# Patient Record
Sex: Male | Born: 1940 | Race: White | Hispanic: No | Marital: Married | Smoking: Former smoker
Health system: Southern US, Community
[De-identification: ages and names within clinical notes are randomized; demographics above are authoritative.]

## PROBLEM LIST (undated history)

## (undated) DIAGNOSIS — E785 Hyperlipidemia, unspecified: Secondary | ICD-10-CM

## (undated) DIAGNOSIS — I10 Essential (primary) hypertension: Secondary | ICD-10-CM

## (undated) DIAGNOSIS — J302 Other seasonal allergic rhinitis: Secondary | ICD-10-CM

## (undated) DIAGNOSIS — I2510 Atherosclerotic heart disease of native coronary artery without angina pectoris: Secondary | ICD-10-CM

## (undated) HISTORY — PX: RETINAL DETACHMENT SURGERY: SHX105

## (undated) HISTORY — PX: CATARACT EXTRACTION W/ INTRAOCULAR LENS IMPLANT: SHX1309

## (undated) HISTORY — PX: BACK SURGERY: SHX140

---

## 2001-08-11 ENCOUNTER — Ambulatory Visit (HOSPITAL_COMMUNITY): Admission: RE | Admit: 2001-08-11 | Discharge: 2001-08-11 | Payer: Self-pay | Admitting: General Surgery

## 2001-11-24 ENCOUNTER — Encounter: Payer: Self-pay | Admitting: Internal Medicine

## 2001-11-24 ENCOUNTER — Ambulatory Visit (HOSPITAL_COMMUNITY): Admission: RE | Admit: 2001-11-24 | Discharge: 2001-11-24 | Payer: Self-pay | Admitting: Internal Medicine

## 2002-11-10 ENCOUNTER — Encounter (HOSPITAL_COMMUNITY): Admission: RE | Admit: 2002-11-10 | Discharge: 2002-12-10 | Payer: Self-pay | Admitting: Internal Medicine

## 2002-11-13 ENCOUNTER — Encounter: Payer: Self-pay | Admitting: Internal Medicine

## 2002-12-06 ENCOUNTER — Encounter (HOSPITAL_COMMUNITY): Admission: RE | Admit: 2002-12-06 | Discharge: 2003-01-05 | Payer: Self-pay | Admitting: Internal Medicine

## 2003-12-06 ENCOUNTER — Ambulatory Visit (HOSPITAL_COMMUNITY): Admission: RE | Admit: 2003-12-06 | Discharge: 2003-12-06 | Payer: Self-pay | Admitting: Internal Medicine

## 2004-10-27 ENCOUNTER — Ambulatory Visit (HOSPITAL_COMMUNITY): Admission: RE | Admit: 2004-10-27 | Discharge: 2004-10-27 | Payer: Self-pay | Admitting: Internal Medicine

## 2005-12-11 ENCOUNTER — Encounter: Admission: RE | Admit: 2005-12-11 | Discharge: 2005-12-11 | Payer: Self-pay | Admitting: Internal Medicine

## 2006-07-13 ENCOUNTER — Ambulatory Visit (HOSPITAL_COMMUNITY): Admission: RE | Admit: 2006-07-13 | Discharge: 2006-07-13 | Payer: Self-pay | Admitting: General Surgery

## 2006-09-09 ENCOUNTER — Ambulatory Visit (HOSPITAL_COMMUNITY): Admission: RE | Admit: 2006-09-09 | Discharge: 2006-09-09 | Payer: Self-pay | Admitting: Internal Medicine

## 2006-11-01 ENCOUNTER — Encounter: Admission: RE | Admit: 2006-11-01 | Discharge: 2006-11-01 | Payer: Self-pay | Admitting: Internal Medicine

## 2006-11-08 ENCOUNTER — Ambulatory Visit: Payer: Self-pay | Admitting: Orthopedic Surgery

## 2006-11-16 ENCOUNTER — Encounter (HOSPITAL_COMMUNITY): Admission: RE | Admit: 2006-11-16 | Discharge: 2006-12-16 | Payer: Self-pay | Admitting: Orthopedic Surgery

## 2006-12-27 ENCOUNTER — Ambulatory Visit: Payer: Self-pay | Admitting: Orthopedic Surgery

## 2009-11-30 ENCOUNTER — Encounter: Admission: RE | Admit: 2009-11-30 | Discharge: 2009-11-30 | Payer: Self-pay | Admitting: Internal Medicine

## 2010-07-24 ENCOUNTER — Ambulatory Visit: Payer: Self-pay | Admitting: Otolaryngology

## 2011-02-27 NOTE — H&P (Signed)
[facility]  Patient:    Michael [name], Michael [name] Visit Number: [id number] MRN: [id number]          Service Type: END Location: DAY Attending Physician:  Dalia Heading Dictated by:   Franky Macho, M.D. Admit Date:  08/11/2001 Discharge Date: 08/11/2001   CC:         Carylon Perches, M.D.   History and Physical  DATE OF BIRTH:  Mar 11, 1941  CHIEF COMPLAINT:  History of colon polyps.  HISTORY OF PRESENT ILLNESS:  The patient is a 70 year old white male who was referred for a follow-up colonoscopy.  He had a colonoscopy with polypectomy three years ago.  Hyperplastic polyps were found at that time.  He denies any lightheadedness, weight loss, fever, abdominal pain, diarrhea, constipation, hematochezia, melena.  He denies any hemorrhoidal problems.  There is no family history of colon carcinoma.  PAST MEDICAL HISTORY: 1. High cholesterol levels. 2. Hypertension.  PAST SURGICAL HISTORY: 1. Multiple colonoscopies in the past. 2. Cyst excision from the scrotum.  CURRENT MEDICATIONS:  Pravachol, ______ , and baby aspirin.  ALLERGIES:  No known drug allergies.  REVIEW OF SYSTEMS:  The patient denies significant tobacco use.  He occasionally has two drinks a day of alcohol.  Denies any other cardiopulmonary difficulties or bleeding disorders.  PHYSICAL EXAMINATION:  GENERAL:  Well-developed, well-nourished white male in no acute distress.  VITAL SIGNS:  Afebrile, vital signs stable.  LUNGS:  Clear to auscultation with equal breath sounds bilaterally.  HEART:  Regular rate and rhythm.  Without S3, S4, or murmurs.  ABDOMEN:  Unremarkable.  RECTAL:  Deferred until the procedure.  IMPRESSION:  History of colon polyps.  PLAN:  The patient is scheduled for colonoscopy on August 11, 2001.  The risks and benefits of the procedure including bleeding and perforation were fully explained to the patient, who gave informed consent.  Colyte  preparation has been prescribed. Dictated by:   Franky Macho, M.D. Attending Physician:  Dalia Heading DD:  08/02/01 TD:  08/03/01 Job: [id number]

## 2011-02-27 NOTE — Procedures (Signed)
   NAME:  JAQUES, MINEER NO.:  [id number]   MEDICAL RECORD NO.:  [id number]                  PATIENT TYPE:   LOCATION:                                       FACILITY:  [facility]   PHYSICIAN:  Kingsley Callander. Ouida Sills, M.D.                  DATE OF BIRTH:   DATE OF PROCEDURE:  11/10/2002  DATE OF DISCHARGE:                                    STRESS TEST   CARDIOLITE STRESS TEST REPORT:   INTERPRETATION:  The patient exercised 8 minutes 37 seconds (2 minutes 37  seconds into Stage III of the Bruce Protocol) attaining a maximal heart rate  of 179 (114% of the age predicted maximal heart rate) at a workload of 10.1  METS and discontinued exercise due to fatigue.  There were no symptoms of  chest pain.  There were no arrhythmias.  There were no ST segment changes  diagnostic of ischemia.  The baseline electrocardiogram revealed normal  sinus rhythm at 93 beats per minute.  The baseline blood pressure was 142/84  and reached a peak of 182/82/88.  There was a brisk cardioaccelerator  response.   IMPRESSION:  No evidence of exercise-induced ischemia.  Cardiolite images  are pending.                                               Kingsley Callander. Ouida Sills, M.D.    ROF/MEDQ  D:  11/10/2002  T:  11/10/2002  Job:  [id number]

## 2011-02-27 NOTE — H&P (Signed)
NAME:  Michael [name], RINGER NO.:  [id number]   MEDICAL RECORD NO.:  [id number]          PATIENT TYPE:  AMB   LOCATION:  DAY                           FACILITY:  [facility]   PHYSICIAN:  Dalia Heading, M.D.  DATE OF BIRTH:  1941-01-07   DATE OF ADMISSION:  DATE OF DISCHARGE:  LH                                HISTORY & PHYSICAL   CHIEF COMPLAINT:  History of colon polyps.   HISTORY OF PRESENT ILLNESS:  The patient is a 70 year old white male status  post a colonoscopy in 2002 with polypectomy who now presents for a follow-up  colonoscopy.  The pathology in 2002 was negative for any malignancy.  This  polyp was found in the distal transverse colon.  He denies any current  gastrointestinal complaints.  No new medical problems have been noted.   PAST MEDICAL HISTORY:  Includes high cholesterol levels, hypertension.   PAST SURGICAL HISTORY:  Multiple colonoscopies in the past, excision of cyst  from the scrotum, diverticula.   CURRENT MEDICATIONS:  Crestor, Benicar.   ALLERGIES:  NO KNOWN DRUG ALLERGIES.   REVIEW OF SYSTEMS:  The patient denies tobacco use.  Drinks alcohol  socially.   PHYSICAL EXAMINATION:  GENERAL:  The patient is a well-developed, well-  nourished white male in no acute distress.  LUNGS:  Clear to auscultation with equal breath sounds bilaterally.  HEART:  Reveals regular rate and rhythm without S3, S4, or murmurs.  ABDOMEN:  Soft, nontender, nondistended.  RECTAL:  Deferred to the procedure.   IMPRESSION:  History of colon polyps, diverticulosis.   PLAN:  The patient is scheduled for a colonoscopy on 07/13/2006.  The risks  and benefits of the procedure, including bleeding and perforation, were  fully explained to the patient, gave informed consent.      Dalia Heading, M.D.  Electronically Signed     MAJ/MEDQ  D:  07/08/2006  T:  07/08/2006  Job:  [id number]   cc:   Kingsley Callander. Ouida Sills, MD  Fax: [phone]

## 2011-07-14 NOTE — H&P (Signed)
NTS SOAP Note  Vital Signs:  Vitals as of: 07/14/2011: Systolic 153: Diastolic 92: Heart Rate 79: Temp 97.79F: Height 75ft 10in: Weight 161Lbs 0 Ounces: Pain Level 0: BMI 23  BMI : 23.1 kg/m2  Subjective: This 70 Years 2 Months old Male presents for of need for screening TCS. Has h/o colon polyps in the remote past, last, TCS in 2007 negative. No GI complaints. No family h/o colon carcinoma.  Review of Symptoms:  Constitutional: unremarkable  Head: unremarkable  Eyes:unremarkable  Nose/Mouth/Throat:unremarkable  Cardiovascular:unremarkable  Respiratory: wheezing Gastrointestinal:unremarkable  Genitourinary: unremarkable  Musculoskeletal: unremarkable  Skin: unremarkable  Hematolgic/Lymphatic: unremarkable  hayfever   Past Medical History:Reviewed  Past Medical History  Surgical History: TCS in 2007 for follow up of colon polyps Medical Problems: High Blood pressure, High cholesterol, asthma Allergies: nkda Medications: crestor, diovan, advair, allegra   Social History: Reviewed   Social History  Preferred Language: English ([place]) Race: White Ethnicity: Not Hispanic / Latino Age: 70 Years 2 Months Marital Status: M Alcohol: No Recreational drug(s): No   Smoking Status: Never smoker reviewed on 07/14/2011  Family History: Reviewed  Family History  Is there a family history of:No family h/o colon carcinoma    Objective Information:  General: Well appearing, well nourished in no distress.  Skin:no rash or prominent lesions  Head: Atraumatic; no masses; no abnormalities  Heart: RRR, no murmur or gallop. Normal S1, S2. No S3, S4.  Lungs:CTA bilaterally, no wheezes, rhonchi, rales. Breathing unlabored.  Abdomen: Soft, NT/ND, no HSM, no masses.  deferred to procedure  Assessment: Need for screening TCS  Diagnosis & Procedure: DiagnosisCode: V76.51, ProcedureCode: 91478,    Plan:Scheduled for TCS on 07/31/11.   Patient Education: Alternative  treatments to surgery were discussed with patient (and family).Risks and benefits of procedure were fully explained to the patient (and family) who gave informed consent. Patient/family questions were addressed.  Follow-up: Pending Surgery

## 2011-07-30 MED ORDER — SODIUM CHLORIDE 0.45 % IV SOLN
Freq: Once | INTRAVENOUS | Status: AC
  Administered 2011-07-31: 1000 mL via INTRAVENOUS

## 2011-07-31 ENCOUNTER — Ambulatory Visit (HOSPITAL_COMMUNITY)
Admission: RE | Admit: 2011-07-31 | Discharge: 2011-07-31 | Disposition: A | Discharge status: Home / Self Care | Payer: Medicare Other | Source: Ambulatory Visit | Attending: General Surgery | Admitting: General Surgery

## 2011-07-31 ENCOUNTER — Encounter (HOSPITAL_COMMUNITY)
Admission: RE | Disposition: A | Discharge status: Home / Self Care | Payer: Self-pay | Source: Ambulatory Visit | Attending: General Surgery

## 2011-07-31 ENCOUNTER — Encounter (HOSPITAL_COMMUNITY): Payer: Self-pay | Admitting: *Deleted

## 2011-07-31 DIAGNOSIS — K5730 Diverticulosis of large intestine without perforation or abscess without bleeding: Secondary | ICD-10-CM | POA: Insufficient documentation

## 2011-07-31 DIAGNOSIS — Z1211 Encounter for screening for malignant neoplasm of colon: Secondary | ICD-10-CM | POA: Insufficient documentation

## 2011-07-31 DIAGNOSIS — Z79899 Other long term (current) drug therapy: Secondary | ICD-10-CM | POA: Insufficient documentation

## 2011-07-31 DIAGNOSIS — I10 Essential (primary) hypertension: Secondary | ICD-10-CM | POA: Insufficient documentation

## 2011-07-31 DIAGNOSIS — E7800 Pure hypercholesterolemia, unspecified: Secondary | ICD-10-CM | POA: Insufficient documentation

## 2011-07-31 HISTORY — DX: Essential (primary) hypertension: I10

## 2011-07-31 HISTORY — PX: COLONOSCOPY: SHX5424

## 2011-07-31 HISTORY — DX: Atherosclerotic heart disease of native coronary artery without angina pectoris: I25.10

## 2011-07-31 SURGERY — COLONOSCOPY
Anesthesia: Moderate Sedation

## 2011-07-31 MED ORDER — MEPERIDINE HCL 100 MG/ML IJ SOLN
INTRAMUSCULAR | Status: AC
  Filled 2011-07-31: qty 1

## 2011-07-31 MED ORDER — MIDAZOLAM HCL 5 MG/5ML IJ SOLN
INTRAMUSCULAR | Status: AC
  Filled 2011-07-31: qty 10

## 2011-07-31 MED ORDER — MEPERIDINE HCL 25 MG/ML IJ SOLN
INTRAMUSCULAR | Status: DC | PRN
  Administered 2011-07-31: 50 mg via INTRAVENOUS

## 2011-07-31 MED ORDER — MIDAZOLAM HCL 5 MG/5ML IJ SOLN
INTRAMUSCULAR | Status: DC | PRN
  Administered 2011-07-31: 2 mg via INTRAVENOUS
  Administered 2011-07-31: 1 mg via INTRAVENOUS

## 2011-07-31 MED ORDER — STERILE WATER FOR IRRIGATION IR SOLN
Status: DC | PRN
  Administered 2011-07-31: 08:00:00

## 2011-07-31 NOTE — Interval H&P Note (Signed)
History and Physical Interval Note:   07/31/2011   7:29 AM   Michael [name]  has presented today for surgery, with the diagnosis of Screening for colon cancer [V76.51]  The various methods of treatment have been discussed with the patient and family. After consideration of risks, benefits and other options for treatment, the patient has consented to  Procedure(s): COLONOSCOPY as a surgical intervention .  I have reviewed the patients' chart and labs.  Questions were answered to the patient's satisfaction.     Dalia Heading  MD

## 2011-08-07 ENCOUNTER — Encounter (HOSPITAL_COMMUNITY): Payer: Self-pay | Admitting: General Surgery

## 2011-10-21 ENCOUNTER — Encounter: Payer: Self-pay | Admitting: Orthopedic Surgery

## 2011-10-21 ENCOUNTER — Ambulatory Visit (INDEPENDENT_AMBULATORY_CARE_PROVIDER_SITE_OTHER): Payer: Medicare Other | Admitting: Orthopedic Surgery

## 2011-10-21 VITALS — BP 130/80 | Ht 70.0 in | Wt 153.0 lb

## 2011-10-21 DIAGNOSIS — M719 Bursopathy, unspecified: Secondary | ICD-10-CM

## 2011-10-21 DIAGNOSIS — M7580 Other shoulder lesions, unspecified shoulder: Secondary | ICD-10-CM

## 2011-10-21 NOTE — Patient Instructions (Addendum)
You have received a steroid shot. 15% of patients experience increased pain at the injection site with in the next 24 hours. This is best treated with ice and tylenol extra strength 2 tabs every 8 hours. If you are still having pain please call the office.   Start physical therapy at the hospital  Impingement Syndrome, Rotator Cuff, Bursitis with Rehab Impingement syndrome is a condition that involves inflammation of the tendons of the rotator cuff and the subacromial bursa, that causes pain in the shoulder. The rotator cuff consists of four tendons and muscles that control much of the shoulder and upper arm function. The subacromial bursa is a fluid filled sac that helps reduce friction between the rotator cuff and one of the bones of the shoulder (acromion). Impingement syndrome is usually an overuse injury that causes swelling of the bursa (bursitis), swelling of the tendon (tendonitis), and/or a tear of the tendon (strain). Strains are classified into three categories. Grade 1 strains cause pain, but the tendon is not lengthened. Grade 2 strains include a lengthened ligament, due to the ligament being stretched or partially ruptured. With grade 2 strains there is still function, although the function may be decreased. Grade 3 strains include a complete tear of the tendon or muscle, and function is usually impaired. SYMPTOMS    Pain around the shoulder, often at the outer portion of the upper arm.     Pain that gets worse with shoulder function, especially when reaching overhead or lifting.     Sometimes, aching when not using the arm.     Pain that wakes you up at night.     Sometimes, tenderness, swelling, warmth, or redness over the affected area.     Loss of strength.     Limited motion of the shoulder, especially reaching behind the back (to the back pocket or to unhook bra) or across your body.     Crackling sound (crepitation) when moving the arm.     Biceps tendon pain and  inflammation (in the front of the shoulder). Worse when bending the elbow or lifting.  CAUSES   Impingement syndrome is often an overuse injury, in which chronic (repetitive) motions cause the tendons or bursa to become inflamed. A strain occurs when a force is paced on the tendon or muscle that is greater than it can withstand. Common mechanisms of injury include: Stress from sudden increase in duration, frequency, or intensity of training.  Direct hit (trauma) to the shoulder.     Aging, erosion of the tendon with normal use.     Bony bump on shoulder (acromial spur).  RISK INCREASES WITH:  Contact sports (football, wrestling, boxing).     Throwing sports (baseball, tennis, volleyball).     Weightlifting and bodybuilding.     Heavy labor.     Previous injury to the rotator cuff, including impingement.     Poor shoulder strength and flexibility.     Failure to warm up properly before activity.     Inadequate protective equipment.     Old age.     Bony bump on shoulder (acromial spur).  PREVENTION    Warm up and stretch properly before activity.     Allow for adequate recovery between workouts.     Maintain physical fitness:     Strength, flexibility, and endurance.     Cardiovascular fitness.     Learn and use proper exercise technique.  PROGNOSIS   If treated properly, impingement syndrome usually goes away  within 6 weeks. Sometimes surgery is required.   RELATED COMPLICATIONS    Longer healing time if not properly treated, or if not given enough time to heal.     Recurring symptoms, that result in a chronic condition.     Shoulder stiffness, frozen shoulder, or loss of motion.     Rotator cuff tendon tear.     Recurring symptoms, especially if activity is resumed too soon, with overuse, with a direct blow, or when using poor technique.  TREATMENT   Treatment first involves the use of ice and medicine, to reduce pain and inflammation. The use of  strengthening and stretching exercises may help reduce pain with activity. These exercises may be performed at home or with a therapist. If non-surgical treatment is unsuccessful after more than 6 months, surgery may be advised. After surgery and rehabilitation, activity is usually possible in 3 months.   MEDICATION  If pain medicine is needed, nonsteroidal anti-inflammatory medicines (aspirin and ibuprofen), or other minor pain relievers (acetaminophen), are often advised.     Do not take pain medicine for 7 days before surgery.     Prescription pain relievers may be given, if your caregiver thinks they are needed. Use only as directed and only as much as you need.     Corticosteroid injections may be given by your caregiver. These injections should be reserved for the most serious cases, because they may only be given a certain number of times.  HEAT AND COLD  Cold treatment (icing) should be applied for 10 to 15 minutes every 2 to 3 hours for inflammation and pain, and immediately after activity that aggravates your symptoms. Use ice packs or an ice massage.     Heat treatment may be used before performing stretching and strengthening activities prescribed by your caregiver, physical therapist, or athletic trainer. Use a heat pack or a warm water soak.  SEEK MEDICAL CARE IF:    Symptoms get worse or do not improve in 4 to 6 weeks, despite treatment.     New, unexplained symptoms develop. (Drugs used in treatment may produce side effects.)  EXERCISES   RANGE OF MOTION (ROM) AND STRETCHING EXERCISES - Impingement Syndrome (Rotator Cuff  Tendinitis, Bursitis) These exercises may help you when beginning to rehabilitate your injury. Your symptoms may go away with or without further involvement from your physician, physical therapist or athletic trainer. While completing these exercises, remember:    Restoring tissue flexibility helps normal motion to return to the joints. This allows  healthier, less painful movement and activity.     An effective stretch should be held for at least 30 seconds.     A stretch should never be painful. You should only feel a gentle lengthening or release in the stretched tissue.  STRETCH - Flexion, Standing  Stand with good posture. With an underhand grip on your right / left hand, and an overhand grip on the opposite hand, grasp a broomstick or cane so that your hands are a little more than shoulder width apart.     Keeping your right / left elbow straight and shoulder muscles relaxed, push the stick with your opposite hand, to raise your right / left arm in front of your body and then overhead. Raise your arm until you feel a stretch in your right / left shoulder, but before you have increased shoulder pain.     Try to avoid shrugging your right / left shoulder as your arm rises, by keeping your  shoulder blade tucked down and toward your mid-back spine. Hold for __________ seconds.     Slowly return to the starting position.  Repeat __________ times. Complete this exercise __________ times per day. STRETCH - Abduction, Supine  Lie on your back. With an underhand grip on your right / left hand and an overhand grip on the opposite hand, grasp a broomstick or cane so that your hands are a little more than shoulder width apart.     Keeping your right / left elbow straight and your shoulder muscles relaxed, push the stick with your opposite hand, to raise your right / left arm out to the side of your body and then overhead. Raise your arm until you feel a stretch in your right / left shoulder, but before you have increased shoulder pain.     Try to avoid shrugging your right / left shoulder as your arm rises, by keeping your shoulder blade tucked down and toward your mid-back spine. Hold for __________ seconds.     Slowly return to the starting position.  Repeat __________ times. Complete this exercise __________ times per day. ROM - Flexion,  Active-Assisted  Lie on your back. You may bend your knees for comfort.     Grasp a broomstick or cane so your hands are about shoulder width apart. Your right / left hand should grip the end of the stick, so that your hand is positioned "thumbs-up," as if you were about to shake hands.     Using your healthy arm to lead, raise your right / left arm overhead, until you feel a gentle stretch in your shoulder. Hold for __________ seconds.     Use the stick to assist in returning your right / left arm to its starting position.  Repeat __________ times. Complete this exercise __________ times per day.   ROM - Internal Rotation, Supine   Lie on your back on a firm surface. Place your right / left elbow about 60 degrees away from your side. Elevate your elbow with a folded towel, so that the elbow and shoulder are the same height.     Using a broomstick or cane and your strong arm, pull your right / left hand toward your body until you feel a gentle stretch, but no increase in your shoulder pain. Keep your shoulder and elbow in place throughout the exercise.     Hold for __________ seconds. Slowly return to the starting position.  Repeat __________ times. Complete this exercise __________ times per day. STRETCH - Internal Rotation  Place your right / left hand behind your back, palm up.     Throw a towel or belt over your opposite shoulder. Grasp the towel with your right / left hand.     While keeping an upright posture, gently pull up on the towel, until you feel a stretch in the front of your right / left shoulder.     Avoid shrugging your right / left shoulder as your arm rises, by keeping your shoulder blade tucked down and toward your mid-back spine.     Hold for __________ seconds. Release the stretch, by lowering your healthy hand.  Repeat __________ times. Complete this exercise __________ times per day. ROM - Internal Rotation   Using an underhand grip, grasp a stick behind your  back with both hands.     While standing upright with good posture, slide the stick up your back until you feel a mild stretch in the front of your  shoulder.     Hold for __________ seconds. Slowly return to your starting position.  Repeat __________ times. Complete this exercise __________ times per day.   STRETCH - Posterior Shoulder Capsule   Stand or sit with good posture. Grasp your right / left elbow and draw it across your chest, keeping it at the same height as your shoulder.     Pull your elbow, so your upper arm comes in closer to your chest. Pull until you feel a gentle stretch in the back of your shoulder.     Hold for __________ seconds.  Repeat __________ times. Complete this exercise __________ times per day. STRENGTHENING EXERCISES - Impingement Syndrome (Rotator Cuff Tendinitis, Bursitis) These exercises may help you when beginning to rehabilitate your injury. They may resolve your symptoms with or without further involvement from your physician, physical therapist or athletic trainer. While completing these exercises, remember:  Muscles can gain both the endurance and the strength needed for everyday activities through controlled exercises.     Complete these exercises as instructed by your physician, physical therapist or athletic trainer. Increase the resistance and repetitions only as guided.     You may experience muscle soreness or fatigue, but the pain or discomfort you are trying to eliminate should never worsen during these exercises. If this pain does get worse, stop and make sure you are following the directions exactly. If the pain is still present after adjustments, discontinue the exercise until you can discuss the trouble with your clinician.     During your recovery, avoid activity or exercises which involve actions that place your injured hand or elbow above your head or behind your back or head. These positions stress the tissues which you are trying to heal.   STRENGTH - Scapular Depression and Adduction   With good posture, sit on a firm chair. Support your arms in front of you, with pillows, arm rests, or on a table top. Have your elbows in line with the sides of your body.     Gently draw your shoulder blades down and toward your mid-back spine. Gradually increase the tension, without tensing the muscles along the top of your shoulders and the back of your neck.     Hold for __________ seconds. Slowly release the tension and relax your muscles completely before starting the next repetition.     After you have practiced this exercise, remove the arm support and complete the exercise in standing as well as sitting position.  Repeat __________ times. Complete this exercise __________ times per day.   STRENGTH - Shoulder Abductors, Isometric  With good posture, stand or sit about 4-6 inches from a wall, with your right / left side facing the wall.     Bend your right / left elbow. Gently press your right / left elbow into the wall. Increase the pressure gradually, until you are pressing as hard as you can, without shrugging your shoulder or increasing any shoulder discomfort.     Hold for __________ seconds.     Release the tension slowly. Relax your shoulder muscles completely before you begin the next repetition.  Repeat __________ times. Complete this exercise __________ times per day.   STRENGTH - External Rotators, Isometric  Keep your right / left elbow at your side and bend it 90 degrees.     Step into a door frame so that the outside of your right / left wrist can press against the door frame without your upper arm leaving your  side.     Gently press your right / left wrist into the door frame, as if you were trying to swing the back of your hand away from your stomach. Gradually increase the tension, until you are pressing as hard as you can, without shrugging your shoulder or increasing any shoulder discomfort.     Hold for  __________ seconds.     Release the tension slowly. Relax your shoulder muscles completely before you begin the next repetition.  Repeat __________ times. Complete this exercise __________ times per day.   STRENGTH - Supraspinatus   Stand or sit with good posture. Grasp a __________ weight, or an exercise band or tubing, so that your hand is "thumbs-up," like you are shaking hands.     Slowly lift your right / left arm in a "V" away from your thigh, diagonally into the space between your side and straight ahead. Lift your hand to shoulder height or as far as you can, without increasing any shoulder pain. At first, many people do not lift their hands above shoulder height.     Avoid shrugging your right / left shoulder as your arm rises, by keeping your shoulder blade tucked down and toward your mid-back spine.     Hold for __________ seconds. Control the descent of your hand, as you slowly return to your starting position.  Repeat __________ times. Complete this exercise __________ times per day.   STRENGTH - External Rotators  Secure a rubber exercise band or tubing to a fixed object (table, pole) so that it is at the same height as your right / left elbow when you are standing or sitting on a firm surface.     Stand or sit so that the secured exercise band is at your uninjured side.     Bend your right / left elbow 90 degrees. Place a folded towel or small pillow under your right / left arm, so that your elbow is a few inches away from your side.     Keeping the tension on the exercise band, pull it away from your body, as if pivoting on your elbow. Be sure to keep your body steady, so that the movement is coming only from your rotating shoulder.     Hold for __________ seconds. Release the tension in a controlled manner, as you return to the starting position.  Repeat __________ times. Complete this exercise __________ times per day.   STRENGTH - Internal Rotators   Secure a rubber  exercise band or tubing to a fixed object (table, pole) so that it is at the same height as your right / left elbow when you are standing or sitting on a firm surface.     Stand or sit so that the secured exercise band is at your right / left side.     Bend your elbow 90 degrees. Place a folded towel or small pillow under your right / left arm so that your elbow is a few inches away from your side.     Keeping the tension on the exercise band, pull it across your body, toward your stomach. Be sure to keep your body steady, so that the movement is coming only from your rotating shoulder.     Hold for __________ seconds. Release the tension in a controlled manner, as you return to the starting position.  Repeat __________ times. Complete this exercise __________ times per day.   STRENGTH - Scapular Protractors, Standing   Stand arms length away  from a wall. Place your hands on the wall, keeping your elbows straight.     Begin by dropping your shoulder blades down and toward your mid-back spine.     To strengthen your protractors, keep your shoulder blades down, but slide them forward on your rib cage. It will feel as if you are lifting the back of your rib cage away from the wall. This is a subtle motion and can be challenging to complete. Ask your caregiver for further instruction, if you are not sure you are doing the exercise correctly.     Hold for __________ seconds. Slowly return to the starting position, resting the muscles completely before starting the next repetition.  Repeat __________ times. Complete this exercise __________ times per day. STRENGTH - Scapular Protractors, Supine  Lie on your back on a firm surface. Extend your right / left arm straight into the air while holding a __________ weight in your hand.     Keeping your head and back in place, lift your shoulder off the floor.     Hold for __________ seconds. Slowly return to the starting position, and allow your muscles  to relax completely before starting the next repetition.  Repeat __________ times. Complete this exercise __________ times per day. STRENGTH - Scapular Protractors, Quadruped  Get onto your hands and knees, with your shoulders directly over your hands (or as close as you can be, comfortably).     Keeping your elbows locked, lift the back of your rib cage up into your shoulder blades, so your mid-back rounds out. Keep your neck muscles relaxed.     Hold this position for __________ seconds. Slowly return to the starting position and allow your muscles to relax completely before starting the next repetition.  Repeat __________ times. Complete this exercise __________ times per day.   STRENGTH - Scapular Retractors  Secure a rubber exercise band or tubing to a fixed object (table, pole), so that it is at the height of your shoulders when you are either standing, or sitting on a firm armless chair.     With a palm down grip, grasp an end of the band in each hand. Straighten your elbows and lift your hands straight in front of you, at shoulder height. Step back, away from the secured end of the band, until it becomes tense.     Squeezing your shoulder blades together, draw your elbows back toward your sides, as you bend them. Keep your upper arms lifted away from your body throughout the exercise.     Hold for __________ seconds. Slowly ease the tension on the band, as you reverse the directions and return to the starting position.  Repeat __________ times. Complete this exercise __________ times per day. STRENGTH - Shoulder Extensors   Secure a rubber exercise band or tubing to a fixed object (table, pole) so that it is at the height of your shoulders when you are either standing, or sitting on a firm armless chair.     With a thumbs-up grip, grasp an end of the band in each hand. Straighten your elbows and lift your hands straight in front of you, at shoulder height. Step back, away from the  secured end of the band, until it becomes tense.     Squeezing your shoulder blades together, pull your hands down to the sides of your thighs. Do not allow your hands to go behind you.     Hold for __________ seconds. Slowly ease the tension on  the band, as you reverse the directions and return to the starting position.  Repeat __________ times. Complete this exercise __________ times per day.   STRENGTH - Scapular Retractors and External Rotators   Secure a rubber exercise band or tubing to a fixed object (table, pole) so that it is at the height as your shoulders, when you are either standing, or sitting on a firm armless chair.     With a palm down grip, grasp an end of the band in each hand. Bend your elbows 90 degrees and lift your elbows to shoulder height, at your sides. Step back, away from the secured end of the band, until it becomes tense.     Squeezing your shoulder blades together, rotate your shoulders so that your upper arms and elbows remain stationary, but your fists travel upward to head height.     Hold for __________ seconds. Slowly ease the tension on the band, as you reverse the directions and return to the starting position.  Repeat __________ times. Complete this exercise __________ times per day.   STRENGTH - Scapular Retractors and External Rotators, Rowing   Secure a rubber exercise band or tubing to a fixed object (table, pole) so that it is at the height of your shoulders, when you are either standing, or sitting on a firm armless chair.     With a palm down grip, grasp an end of the band in each hand. Straighten your elbows and lift your hands straight in front of you, at shoulder height. Step back, away from the secured end of the band, until it becomes tense.     Step 1: Squeeze your shoulder blades together. Bending your elbows, draw your hands to your chest, as if you are rowing a boat. At the end of this motion, your hands and elbow should be at shoulder  height and your elbows should be out to your sides.     Step 2: Rotate your shoulders, to raise your hands above your head. Your forearms should be vertical and your upper arms should be horizontal.     Hold for __________ seconds. Slowly ease the tension on the band, as you reverse the directions and return to the starting position.  Repeat __________ times. Complete this exercise __________ times per day.   STRENGTH - Scapular Depressors  Find a sturdy chair without wheels, such as a dining room chair.     Keeping your feet on the floor, and your hands on the chair arms, lift your bottom up from the seat, and lock your elbows.     Keeping your elbows straight, allow gravity to pull your body weight down. Your shoulders will rise toward your ears.     Raise your body against gravity by drawing your shoulder blades down your back, shortening the distance between your shoulders and ears. Although your feet should always maintain contact with the floor, your feet should progressively support less body weight, as you get stronger.     Hold for __________ seconds. In a controlled and slow manner, lower your body weight to begin the next repetition.  Repeat __________ times. Complete this exercise __________ times per day.   Document Released: 09/28/2005 Document Revised: 06/10/2011 Document Reviewed: 01/10/2009 [facility] Patient Information 2012 [place], [place].

## 2011-10-21 NOTE — Progress Notes (Signed)
  Subjective:    Michael [name] is a 71 y.o. male who presents with right shoulder pain. The symptoms began several months ago. Aggravating factors: repetitive activity, overhead activity and routine activities. Pain is located diffusely throughout the shoulder and RIGHT shoulder joint and RIGHT arm above the elbow. Discomfort is described as aching, burning and sharp/stabbing. Symptoms are exacerbated by repetitive movements, overhead movements and normal daily activities. Evaluation to date: none. Therapy to date includes: nothing specific.  The following portions of the patient's history were reviewed and updated as appropriate: allergies, current medications, past family history, past medical history, past social history, past surgical history and problem list.  Review of Systems A comprehensive review of systems was negative except for: Respiratory: positive for dyspnea on exertion and wheezing   Objective:    BP 130/80  Ht 5\' 10"  (1.778 m)  Wt 153 lb (69.4 kg)  BMI 21.95 kg/m2  Physical Exam(12) GENERAL: normal development   CDV: pulses are normal   Skin: normal  Lymph: nodes were not palpable/normal  Psychiatric: awake, alert and oriented  Neuro: normal sensation  Right shoulder: there are no areas of specific palpable tenderness.  Range of motion remains full area strength remains full in internal and external rotation with mild weakness in forward elevation in the scapular plane.  The shoulder remained stable.  Her Neer impingement sign was negative.  There was crepitance on range of motion in the subacromial space.  The Hawkins maneuver was normal.  Left shoulder: normal active ROM, no tenderness, no impingement sign     Assessment:    Right rotator cuff tendinitis    Plan:    Natural history and expected course discussed. Questions answered. Agricultural engineer distributed. Plain film x-rays. Shoulder injection. See procedure note. Physical therapy referral.    Shoulder Injection Procedure Note  Pre-operative Diagnosis: right shoulder tendinitis and rotator cuff syndrome  Post-operative Diagnosis: same  Indications: rotator cuff syndrome  Anesthesia:  ethyl chloride  Procedure Details   Shoulder Injection Procedure Note   Pre-operative Diagnosis: right  RC Syndrome  Post-operative Diagnosis: same  Indications: pain   Anesthesia: ethyl chloride   Procedure Details   Verbal consent was obtained for the procedure. The shoulder was prepped withalcohol and the skin was anesthetized. A 20 gauge needle was advanced into the subacromial space through posterior approach without difficulty  The space was then injected with 3 ml 1% lidocaine and 1 ml of depomedrol. The injection site was cleansed with isopropyl alcohol and a dressing was applied.  Complications:  None; patient tolerated the procedure well.  Separately identifiable x-ray report RIGHT shoulder done for RIGHT shoulder pain x6 months  AP and lateral RIGHT shoulder  The glenohumeral joint is normal.  There is a type III acromion.  There are no other abnormalities.  Impression normal RIGHT shoulder.

## 2011-10-26 ENCOUNTER — Ambulatory Visit (HOSPITAL_COMMUNITY)
Admission: RE | Admit: 2011-10-26 | Discharge: 2011-10-26 | Disposition: A | Discharge status: Home / Self Care | Payer: Medicare Other | Source: Ambulatory Visit | Attending: Orthopedic Surgery | Admitting: Orthopedic Surgery

## 2011-10-26 DIAGNOSIS — R42 Dizziness and giddiness: Secondary | ICD-10-CM | POA: Insufficient documentation

## 2011-10-26 DIAGNOSIS — I10 Essential (primary) hypertension: Secondary | ICD-10-CM | POA: Insufficient documentation

## 2011-10-26 DIAGNOSIS — M25519 Pain in unspecified shoulder: Secondary | ICD-10-CM | POA: Insufficient documentation

## 2011-10-26 DIAGNOSIS — R29898 Other symptoms and signs involving the musculoskeletal system: Secondary | ICD-10-CM | POA: Insufficient documentation

## 2011-10-26 DIAGNOSIS — M778 Other enthesopathies, not elsewhere classified: Secondary | ICD-10-CM | POA: Insufficient documentation

## 2011-10-26 DIAGNOSIS — M6281 Muscle weakness (generalized): Secondary | ICD-10-CM | POA: Insufficient documentation

## 2011-10-26 DIAGNOSIS — IMO0001 Reserved for inherently not codable concepts without codable children: Secondary | ICD-10-CM | POA: Insufficient documentation

## 2011-10-26 DIAGNOSIS — M25619 Stiffness of unspecified shoulder, not elsewhere classified: Secondary | ICD-10-CM | POA: Insufficient documentation

## 2011-10-26 NOTE — Patient Instructions (Addendum)
Patient instructed on how to properly poistion shoulder at night to relieve pain. Given AROM shoulder elevation, retraction protraction and rotation and asked to cut down on RUE weights at gym for now. Given explanation of role of OT and course of therapy and is agreeable to OT 3 x week for 6 weeks to decrease pain and increase strength.

## 2011-10-26 NOTE — Progress Notes (Signed)
Occupational Therapy Evaluation  Patient Details  Name: Michael [name] MRN: [id number] Date of Birth: January 05, 1941  Today's Date: 10/26/2011 Time: 4098-1191 Occupational Therapy Evaluation 850-825 35' Therapeutic Exercises [phone] 10' Time Calculation (min): 45 min  Visit#: 1  of 18   Re-eval: 11/26/11  Assessment Diagnosis:  (Right Rotator Cuff Tendonitis) Surgical Date:  (N/A) Next MD Visit:  (12/19/11) Prior Therapy:  (Dr. Romeo Apple Provided Exeercises)  Past Medical History:  Past Medical History  Diagnosis Date  . Hypertension   . Coronary artery disease    Past Surgical History:  Past Surgical History  Procedure Date  . Colonoscopy 07/31/2011    Procedure: COLONOSCOPY;  Surgeon: Dalia Heading;  Location: [facility];  Service: Gastroenterology;  Laterality: N/A;    Subjective Symptoms/Limitations Symptoms: S: "No pain with rest but once he uses the right shoulder to reach into aviary pain begins or when sleeping at night" Limitations:  Michael [name] is a 1 yr. old retired gentleman who has been experiancing 8/10 right shoulder pain over the past several months. Pain is now bothering him when he attempts to sleep  or work with his bird aviaries. Pain Assessment Currently in Pain?: Yes Pain Score:   8 Pain Location: Shoulder Pain Orientation: Right Pain Radiating Towards: down the lasteral side of humerus to elbow Pain Onset: More than a month ago Pain Frequency: Intermittent Multiple Pain Sites: No  Precautions/Restrictions : Vertigo with movement on the mat.    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)  Assessment ADL/Vision/Perception Vision - History Baseline Vision: Wears glasses only for reading Vision - Assessment Eye Alignment: Within Chemical engineer Perception: Within Functional Limits Praxis Praxis: Intact  Cognition/Observation Cognition Arousal/Alertness: Awake/alert Orientation Level: Oriented X4 Safety/Judgment: Appears intact  Sensation/Coordination/Edema Sensation Light Touch: Appears Intact Stereognosis: Appears Intact Hot/Cold: Appears Intact Proprioception: Appears Intact Coordination Gross Motor Movements are Fluid and Coordinated: Yes Fine Motor Movements are Fluid and Coordinated: Yes Edema Edema:  (None)  Additional Assessments RUE Assessment RUE Assessment: Exceptions to [facility] RUE AROM (degrees) RUE Overall AROM Comments:  (Patient demonstrated AROM for RUE WNL) RUE PROM (degrees) RUE Overall PROM Comments:  (Patient has PROM of entire RUE WNL) RUE Strength RUE Overall Strength Comments:  (4/5 strength in IR, ABD, F. FLex, Horizontal Abd. 5/5 ER ) LUE Assessment LUE Assessment: Within Functional Limits  Exercise/Treatments   Seated Elevation: AROM;15 reps Extension: AROM;15 reps Retraction: AROM;15 reps Row: AROM;15 reps Protraction: PROM;15 reps Occupational Therapy Assessment and Plan OT Assessment and Plan Clinical Impression Statement: A: Patient has Full AROM /PROM RUE shoulder with decreased 4/5 strength with IR, ABD, F. Flex and Horizontal Abd. He also experiences Vertigo with change of positions on the mat requiring time to rest and slow transitions. RUE shoulder pain with deceleration of the shoulder and with  IR, ABD, F Flex .  Rehab Potential: Excellent Clinical Impairments Affecting Rehab Potential: Vertigo OT Frequency: Min 3X/week OT Duration: 6 weeks OT Treatment/Interventions: Self-care/ADL training;Therapeutic  exercise;Manual therapy;Modalities;Therapeutic activities;Patient/family education OT Plan: P: Skilled OT required 3 x week x 6 weeks to decrease pain increase strength in RUE shoulder and train in HEP.  Goals Home Exercise Program Pt will Perform Home Exercise Program: Independently Short Term Goals Time to Complete Short Term Goals: 2 weeks Short Term Goal 1: Independent with HEP for Cane exercises. Short Term Goal 2: Independent correct positioning of arm for 0/10 pain with sleep. Short Term Goal 3: Decrease Right shoulder pain to 3/10 from 8/10 with use. Short Term Goal 4: Patient state ability to reach RUE arm into Aviary at shest level without pain. Short Term Goal 5: Patient able to begin cane exercises without pain. Long Term Goals Time to Complete Long Term Goals: Other (comment) (6 weeks) Long Term Goal 1: Regain right shoulder strength to 5/5 Long Term Goal 2: Restored ability to lift arm into aviary and perform functions of caring for his birds without pain Long Term Goal 3: Return to full ADL's without pain when reaching over head  Long Term Goal 4: Independent in HEP for continued strengthening. Long Term Goal 5: Pateint able to sleep through the night without pain in RUE shoulder  Problem List Patient Active Problem List  Diagnoses  . Tendonitis of shoulder, right  . Vertigo  . Pain in joint, shoulder region  . Shoulder weakness    End of Session Activity Tolerance: Patient tolerated treatment well General Behavior During Session: [facility] for tasks performed Cognition: [facility] for tasks performed OT Plan of Care OT Home Exercise Plan: Patient educated on nature and role of OT and agreeable to treatment. Educated on HEP for the shoulder and lmodification of current ADL's Consulted and Agree with Plan of Care: Patient   Lisa Roca OTR/L 10/26/2011, 10:18 AM  Physician Documentation Your signature is required to indicate approval of the treatment plan as stated  above.  Please sign and either send electronically or make a copy of this report for your files and return this physician signed original.  Please mark one 1.__approve of plan  2. ___approve of plan with the following conditions.   ______________________________                                                          _____________________ Physician Signature                                                                                                             Date

## 2011-10-29 ENCOUNTER — Ambulatory Visit (HOSPITAL_COMMUNITY)
Admission: RE | Admit: 2011-10-29 | Discharge: 2011-10-29 | Disposition: A | Discharge status: Home / Self Care | Payer: Medicare Other | Source: Ambulatory Visit | Attending: Internal Medicine | Admitting: Internal Medicine

## 2011-10-29 DIAGNOSIS — R42 Dizziness and giddiness: Secondary | ICD-10-CM

## 2011-10-29 DIAGNOSIS — M25519 Pain in unspecified shoulder: Secondary | ICD-10-CM

## 2011-10-29 DIAGNOSIS — R29898 Other symptoms and signs involving the musculoskeletal system: Secondary | ICD-10-CM

## 2011-10-29 NOTE — Progress Notes (Signed)
Occupational Therapy Treatment  Patient Details  Name: Michael [name] MRN: [id number] Date of Birth: September 24, 1941  Today's Date: 10/29/2011 Time: 2130-8657 Manual Therapy [phone] 10' Therapeutic Exercise [phone] 81' Time Calculation (min): 50 min  Visit#: 2  of 18   Re-eval: 11/26/11    Subjective Symptoms/Limitations Symptoms: S: Has no pain today first thing this morning. Repetition: Decreases Symptoms Pain Assessment Currently in Pain?: Yes Pain Location: Shoulder Pain Orientation: Right Pain Type: Acute pain  Precautions/Restrictions     Exercise/Treatments Supine Protraction: PROM;5 reps Horizontal ABduction: PROM;AAROM;5 reps External Rotation: PROM;AAROM;5 reps Internal Rotation: PROM;AAROM;5 reps Flexion: PROM;AAROM;5 reps ABduction: PROM;AAROM;5 reps Seated Elevation: AROM;15 reps Extension: 15 reps;Strengthening;Theraband Theraband Level (Shoulder Extension): Level 2 (Red) Retraction: 15 reps;Strengthening;Theraband Theraband Level (Shoulder Retraction): Level 2 (Red) Row: 15 reps;Strengthening;Theraband Theraband Level (Shoulder Row): Level 2 (Red) ROM / Strengthening / Isometric Strengthening UBE (Upper Arm Bike):  (2 and 2 back and forward level 1)     Modalities Modalities:  (kenisio tape O and I of long biceps) Manual Therapy Manual Therapy: Joint mobilization Joint Mobilization: light joint mob of shoulder in supine  Occupational Therapy Assessment and Plan OT Assessment and Plan Clinical Impression Statement: A; Patient doing nicely with decreased pain. Pateint gkiven scapular stabalization exercises with theraband and HEP. Also HEP for [employer]. Rehab Potential: Excellent Clinical Impairments Affecting Rehab Potential: Vertigo OT Frequency: Min 3X/week OT Duration: 6 weeks OT Treatment/Interventions: Self-care/ADL training;Therapeutic exercise;Manual therapy;Modalities;Therapeutic activities;Patient/family education OT Plan: P:  Skilled OT required 3 x week x 6 weeks to decrease pain increase strength in RUE shoulder and train in HEP. Cont POC and try to add Cane Ex in supine.   Goals Home Exercise Program Pt will Perform Home Exercise Program: Independently Short Term Goals Time to Complete Short Term Goals: 2 weeks Short Term Goal 1 Progress: Progressing toward goal Short Term Goal 2 Progress: Progressing toward goal Short Term Goal 3 Progress: Met Short Term Goal 4 Progress: Progressing toward goal  Problem List Patient Active Problem List  Diagnoses  . Tendonitis of shoulder, right  . Vertigo  . Pain in joint, shoulder region  . Shoulder weakness    End of Session Activity Tolerance: Patient tolerated treatment well General Behavior During Session: [facility] for tasks performed Cognition: [facility] for tasks performed OT Plan of Care OT Home Exercise Plan: Patient educated on nature and role of OT and agreeable to treatment. Educated on HEP for the shoulder and lmodification of current ADL's Consulted and Agree with Plan of Care: Patient   Lisa Roca OTR/L 10/29/2011, 11:39 AM

## 2011-10-30 ENCOUNTER — Ambulatory Visit (HOSPITAL_COMMUNITY)
Admission: RE | Admit: 2011-10-30 | Discharge: 2011-10-30 | Disposition: A | Discharge status: Home / Self Care | Payer: Medicare Other | Source: Ambulatory Visit | Attending: Internal Medicine | Admitting: Internal Medicine

## 2011-10-30 DIAGNOSIS — M25519 Pain in unspecified shoulder: Secondary | ICD-10-CM

## 2011-10-30 DIAGNOSIS — R42 Dizziness and giddiness: Secondary | ICD-10-CM

## 2011-10-30 DIAGNOSIS — R29898 Other symptoms and signs involving the musculoskeletal system: Secondary | ICD-10-CM

## 2011-10-30 NOTE — Progress Notes (Signed)
Occupational Therapy Treatment  Patient Details  Name: KYLLIAN CLINGERMAN MRN: [id number] Date of Birth: November 16, 1940  Today's Date: 10/30/2011 Time: 9147-8295 Time Calculation (min): 10 min Manual Therapy 10 min Visit#: 3  of 18   Re-eval: 11/26/11    Subjective Symptoms/Limitations Symptoms: No pain only soreness. Pain Assessment Currently in Pain?: No/denies Pain Location: Shoulder Pain Orientation: Right  Precautions/Restrictions     Exercise/Treatments Supine Horizontal ABduction: PROM;AAROM;5 reps External Rotation: PROM;AAROM;5 reps Internal Rotation: PROM;AAROM;5 reps Flexion: PROM;AAROM;5 reps ABduction: PROM;AAROM;5 reps    Manual Therapy Manual Therapy: Joint mobilization Joint Mobilization: Joint mobilizations to increase A/PROM and decrease main.  Occupational Therapy Assessment and Plan OT Assessment and Plan Clinical Impression Statement: A: No pain today. Patient's exercises cut short due to therapist arriving late due to inclimate weather. Rehab Potential: Excellent Clinical Impairments Affecting Rehab Potential: Continues to display verigo when moving from supine to sitting edge of mat. OT Frequency: Min 3X/week OT Duration: 6 weeks OT Treatment/Interventions: Self-care/ADL training;Therapeutic exercise;Therapeutic activities;Manual therapy;Patient/family education;Modalities OT Plan: P Train in [place] exercises from supine position. Go over t-band exercises.   Goals Short Term Goals Short Term Goal 2 Progress: Met Short Term Goal 3 Progress: Met  Problem List Patient Active Problem List  Diagnoses  . Tendonitis of shoulder, right  . Vertigo  . Pain in joint, shoulder region  . Shoulder weakness    End of Session Activity Tolerance: Patient tolerated treatment well General Behavior During Session: [facility] for tasks performed Cognition: [facility] for tasks performed OT Plan of Care OT Home Exercise Plan: Pateint given training in tband exercises and  was given tband for home exercise program. Consulted and Agree with Plan of Care: Patient   Lisa Roca OTR/L 10/30/2011, 3:01 PM

## 2011-10-30 NOTE — Patient Instructions (Signed)
Patient instructed on use of tband and given tband for HEP.

## 2011-11-02 ENCOUNTER — Ambulatory Visit (HOSPITAL_COMMUNITY): Payer: Medicare Other | Admitting: Occupational Therapy

## 2011-11-02 ENCOUNTER — Telehealth (HOSPITAL_COMMUNITY): Payer: Self-pay

## 2011-11-05 ENCOUNTER — Ambulatory Visit (HOSPITAL_COMMUNITY)
Admission: RE | Admit: 2011-11-05 | Discharge: 2011-11-05 | Disposition: A | Discharge status: Home / Self Care | Payer: Medicare Other | Source: Ambulatory Visit | Attending: Orthopedic Surgery | Admitting: Orthopedic Surgery

## 2011-11-05 DIAGNOSIS — M25519 Pain in unspecified shoulder: Secondary | ICD-10-CM

## 2011-11-05 DIAGNOSIS — R42 Dizziness and giddiness: Secondary | ICD-10-CM

## 2011-11-05 DIAGNOSIS — R29898 Other symptoms and signs involving the musculoskeletal system: Secondary | ICD-10-CM

## 2011-11-05 NOTE — Progress Notes (Signed)
Occupational Therapy Treatment  Patient Details  Name: Michael [name] MRN: [id number] Date of Birth: 1941-03-13  Today's Date: 11/05/2011 Time: 1191-4782 Manual Therapy 10 ' Therapeutic Exercise 25' Time Calculation (min): 35 min  Visit#: 3  of 18   Re-eval: 11/26/11    Subjective Symptoms/Limitations Symptoms: S: No pain . Just a little bit of soreness. Vertigo is not too bad today. Repetition: Decreases Symptoms Pain Assessment Currently in Pain?: No/denies  Precautions/Restrictions     Exercise/Treatments Supine Protraction: PROM;5 reps;AROM;10 reps Horizontal ABduction: PROM;5 reps;AROM;10 reps External Rotation: PROM;5 reps;AROM;10 reps Internal Rotation: PROM;5 reps;AROM;10 reps Flexion: PROM;5 reps;AROM;10 reps ABduction: PROM;5 reps;AROM;10 reps Seated Elevation: AROM;Strengthening;15 reps;Theraband Theraband Level (Shoulder Elevation): Level 3 (Green) Extension: 20 reps;Theraband Theraband Level (Shoulder Extension): Level 4 (Blue) Retraction: AROM;10 reps;Strengthening;20 reps;Theraband Theraband Level (Shoulder Retraction): Level 4 (Blue) Protraction: Strengthening;20 reps;Theraband Theraband Level (Shoulder Protraction): Level 4 (Blue) External Rotation: Strengthening;Theraband;20 reps Theraband Level (Shoulder External Rotation): Level 4 (Blue) Internal Rotation: Strengthening;20 reps;Theraband Theraband Level (Shoulder Internal Rotation): Level 4 (Blue) py Ball   ROM / Strengthening / Isometric Strengthening UBE (Upper Arm Bike): 4' and 4' 1.5 Wall Wash: 1' Thumb Tacks: 1'              Manual Therapy Manual Therapy: Joint mobilization Joint Mobilization: Gentle right shoulder joint mobilization  Occupational Therapy Assessment and Plan OT Assessment and Plan Clinical Impression Statement: A: Continues without pain  and is doing exercises at home. full AROM in supine and ability to do Tband without pain. Rehab Potential:  Excellent Clinical Impairments Affecting Rehab Potential: Continues to display verigo when moving from supine to sitting edge of mat. OT Frequency: Min 3X/week OT Duration: 6 weeks OT Treatment/Interventions: Self-care/ADL training;Therapeutic exercise;Therapeutic activities;Manual therapy;Patient/family education;Modalities OT Plan: P Train in [place] exercises from supine position. Go over t-band exercises.   Goals Home Exercise Program PT Goal: Perform Home Exercise Program - Progress: Met Short Term Goals Time to Complete Short Term Goals: 2 weeks Short Term Goal 2 Progress: Met Short Term Goal 3 Progress: Met Short Term Goal 4 Progress: Progressing toward goal  Problem List Patient Active Problem List  Diagnoses  . Tendonitis of shoulder, right  . Vertigo  . Pain in joint, shoulder region  . Shoulder weakness    End of Session Equipment Utilized During Treatment: changed to blue band. Activity Tolerance: Patient tolerated treatment well General Behavior During Session: [facility] for tasks performed Cognition: [facility] for tasks performed OT Plan of Care OT Home Exercise Plan: Pateint given training in tband exercises and was given tband for home exercise program. Consulted and Agree with Plan of Care: Patient   Lisa Roca OTR/L 11/05/2011, 9:37 AM

## 2011-11-06 ENCOUNTER — Ambulatory Visit (HOSPITAL_COMMUNITY)
Admission: RE | Admit: 2011-11-06 | Discharge: 2011-11-06 | Disposition: A | Discharge status: Home / Self Care | Payer: Medicare Other | Source: Ambulatory Visit | Attending: Internal Medicine | Admitting: Internal Medicine

## 2011-11-06 DIAGNOSIS — R42 Dizziness and giddiness: Secondary | ICD-10-CM

## 2011-11-06 DIAGNOSIS — M25519 Pain in unspecified shoulder: Secondary | ICD-10-CM

## 2011-11-06 DIAGNOSIS — R29898 Other symptoms and signs involving the musculoskeletal system: Secondary | ICD-10-CM

## 2011-11-06 MED ORDER — FENTANYL CITRATE 0.05 MG/ML IJ SOLN
INTRAMUSCULAR | Status: AC
  Filled 2011-11-06: qty 2

## 2011-11-06 NOTE — Progress Notes (Signed)
Occupational Therapy Treatment  Patient Details  Name: Michael [name] MRN: [id number] Date of Birth: 1941/02/03  Today's Date: 11/06/2011 Time: 4098-1191 Time Calculation (min): 52 min  Visit#: 4  of 18   Re-eval: 11/26/11 Manual Therapy [phone]  Therapeutic Exercise [phone]     Subjective Symptoms/Limitations Symptoms: S: I am just a little sore. Pain Assessment Currently in Pain?: No/denies  Precautions/Restrictions     Exercise/Treatments Supine Protraction: PROM;10 reps;AROM;12 reps Horizontal ABduction: PROM;10 reps;AROM;12 reps External Rotation: PROM;10 reps;AROM;12 reps Internal Rotation: PROM;10 reps;AROM;12 reps Flexion: PROM;10 reps;AROM;12 reps ABduction: PROM;10 reps;AROM;12 reps Seated Extension: Theraband;12 reps Theraband Level (Shoulder Extension): Level 3 (Green) Retraction: Theraband;12 reps Theraband Level (Shoulder Retraction): Level 3 (Green) Row: Theraband;12 reps Theraband Level (Shoulder Row): Level 3 (Green) Protraction: AROM;12 reps Horizontal ABduction: AROM;12 reps External Rotation: AROM;Theraband;12 reps Theraband Level (Shoulder External Rotation): Level 3 (Green) Internal Rotation: AROM;Theraband;12 reps Theraband Level (Shoulder Internal Rotation): Level 3 (Green) Flexion: AROM;12 reps Abduction: AROM;12 reps ROM / Strengthening / Isometric Strengthening UBE (Upper Arm Bike): 3'forward 3' backward 2.0 Wall Wash: 3' Thumb Tacks: 1' Wall Pushups: 10 reps Proximal Shoulder Strengthening, Supine: x10           Manual Therapy Manual Therapy: Myofascial release Myofascial Release: MFR and manual stretching to right upper arm, scapula and upper trap to decrease restrictions to decrease pain and allow for increase AROM.  Occupational Therapy Assessment and Plan OT Assessment and Plan Clinical Impression Statement: A:  Added prox. shoulder strength supine and wall pushups to increase scapular stability. Rehab Potential:  Excellent OT Plan: P:  Review tband for HEP.   Goals Home Exercise Program Pt will Perform Home Exercise Program: Independently Short Term Goals Time to Complete Short Term Goals: 2 weeks Short Term Goal 1: Independent with HEP for Cane exercises. Short Term Goal 2: Independent correct positioning of arm for 0/10 pain with sleep. Short Term Goal 3: Decrease Right shoulder pain to 3/10 from 8/10 with use. Short Term Goal 4: Patient state ability to reach RUE arm into Aviary at shest level without pain. Short Term Goal 5: Patient able to begin cane exercises without pain. Long Term Goals Time to Complete Long Term Goals: Other (comment) (6 weeks) Long Term Goal 1: Regain right shoulder strength to 5/5 Long Term Goal 2: Restored ability to lift arm into aviary and perform functions of caring for his birds without pain Long Term Goal 3: Return to full ADL's without pain when reaching over head  Long Term Goal 4: Independent in HEP for continued strengthening. Long Term Goal 5: Pateint able to sleep through the night without pain in RUE shoulder  Problem List Patient Active Problem List  Diagnoses  . Tendonitis of shoulder, right  . Vertigo  . Pain in joint, shoulder region  . Shoulder weakness    End of Session Activity Tolerance: Patient tolerated treatment well General Behavior During Session: [facility] for tasks performed Cognition: [facility] for tasks performed  Charlese Gruetzmacher L. Yechiel Erny, COTA/L  11/06/2011, 11:06 AM

## 2011-12-22 ENCOUNTER — Ambulatory Visit (INDEPENDENT_AMBULATORY_CARE_PROVIDER_SITE_OTHER): Payer: Medicare Other | Admitting: Orthopedic Surgery

## 2011-12-22 ENCOUNTER — Encounter: Payer: Self-pay | Admitting: Orthopedic Surgery

## 2011-12-22 VITALS — BP 108/62 | Ht 70.0 in | Wt 153.0 lb

## 2011-12-22 DIAGNOSIS — M67919 Unspecified disorder of synovium and tendon, unspecified shoulder: Secondary | ICD-10-CM

## 2011-12-22 NOTE — Patient Instructions (Signed)
Call if needed for injection  Activities as tolerated

## 2011-12-22 NOTE — Progress Notes (Signed)
Patient ID: Michael [name], male   DOB: June 04, 1941, 71 y.o.   MRN: [id number] Chief Complaint  Patient presents with  . Follow-up    2 month recheck right shoulder   Status post physical therapy for RIGHT shoulder pain presumed impingement syndrome comes back for recheck area  The therapy did help Review of systems he is having some discomfort in the LEFT shoulder previous history of similar type problem he is also concerned about the Popeye muscle on the RIGHT bicep from a proximal biceps rupture    The exam shows Full forward elevation.  Normal strength compared to the opposite rotator cuff.  No tenderness or swelling.  No instability.  Neurovascular exam normal    The plan is for him to Continue exercises to tolerance and activities as tolerated.  If his symptoms worsen again we will inject the RIGHT shoulder and if necessary if he needs it we will inject the LEFT shoulder

## 2012-07-19 ENCOUNTER — Ambulatory Visit (INDEPENDENT_AMBULATORY_CARE_PROVIDER_SITE_OTHER): Payer: Medicare Other | Admitting: Orthopedic Surgery

## 2012-07-19 ENCOUNTER — Encounter: Payer: Self-pay | Admitting: Orthopedic Surgery

## 2012-07-19 VITALS — BP 92/60 | Ht 70.0 in | Wt 145.0 lb

## 2012-07-19 DIAGNOSIS — M67919 Unspecified disorder of synovium and tendon, unspecified shoulder: Secondary | ICD-10-CM

## 2012-07-19 NOTE — Progress Notes (Signed)
Patient ID: Michael [name], male   DOB: 1941-07-22, 71 y.o.   MRN: [id number] Chief Complaint  Patient presents with  . Shoulder Pain    right shoulder pain, requests injection, last injection 10/21/11    Recurrent RIGHT shoulder pain, severe pain recently, over the last 2 weeks. Last injection in January.  No recent trauma.  Review of systems back pain with sciatica.  For elevation remains intact. Good rotator cuff strength remains intact. Neurovascular exam is normal. 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 Impingement sign is positive.  Rotator cuff syndrome.  Injection subacromial space.  If not improved after 2 weeks, call the, office. We'll set up an MRI to evaluate rotator cuff.  Shoulder Injection Procedure Note   Pre-operative Diagnosis: right  RC Syndrome  Post-operative Diagnosis: same  Indications: pain   Anesthesia: ethyl chloride   Procedure Details   Verbal consent was obtained for the procedure. The shoulder was prepped withalcohol and the skin was anesthetized. A 20 gauge needle was advanced into the subacromial space through posterior approach without difficulty  The space was then injected with 3 ml 1% lidocaine and 1 ml of depomedrol. The injection site was cleansed with isopropyl alcohol and a dressing was applied.  Complications:  None; patient tolerated the procedure well.

## 2012-07-19 NOTE — Patient Instructions (Signed)
You have received a steroid shot. 15% of patients experience increased pain at the injection site with in the next 24 hours. This is best treated with ice and tylenol extra strength 2 tabs every 8 hours. If you are still having pain please call the office.   Impingement Syndrome, Rotator Cuff, Bursitis with Rehab Impingement syndrome is a condition that involves inflammation of the tendons of the rotator cuff and the subacromial bursa, that causes pain in the shoulder. The rotator cuff consists of four tendons and muscles that control much of the shoulder and upper arm function. The subacromial bursa is a fluid filled sac that helps reduce friction between the rotator cuff and one of the bones of the shoulder (acromion). Impingement syndrome is usually an overuse injury that causes swelling of the bursa (bursitis), swelling of the tendon (tendonitis), and/or a tear of the tendon (strain). Strains are classified into three categories. Grade 1 strains cause pain, but the tendon is not lengthened. Grade 2 strains include a lengthened ligament, due to the ligament being stretched or partially ruptured. With grade 2 strains there is still function, although the function may be decreased. Grade 3 strains include a complete tear of the tendon or muscle, and function is usually impaired. SYMPTOMS    Pain around the shoulder, often at the outer portion of the upper arm.   Pain that gets worse with shoulder function, especially when reaching overhead or lifting.   Sometimes, aching when not using the arm.   Pain that wakes you up at night.   Sometimes, tenderness, swelling, warmth, or redness over the affected area.   Loss of strength.   Limited motion of the shoulder, especially reaching behind the back (to the back pocket or to unhook bra) or across your body.   Crackling sound (crepitation) when moving the arm.   Biceps tendon pain and inflammation (in the front of the shoulder). Worse when bending  the elbow or lifting.  CAUSES   Impingement syndrome is often an overuse injury, in which chronic (repetitive) motions cause the tendons or bursa to become inflamed. A strain occurs when a force is paced on the tendon or muscle that is greater than it can withstand. Common mechanisms of injury include: Stress from sudden increase in duration, frequency, or intensity of training.  Direct hit (trauma) to the shoulder.   Aging, erosion of the tendon with normal use.   Bony bump on shoulder (acromial spur).  RISK INCREASES WITH:  Contact sports (football, wrestling, boxing).   Throwing sports (baseball, tennis, volleyball).   Weightlifting and bodybuilding.   Heavy labor.   Previous injury to the rotator cuff, including impingement.   Poor shoulder strength and flexibility.   Failure to warm up properly before activity.   Inadequate protective equipment.   Old age.   Bony bump on shoulder (acromial spur).  PREVENTION    Warm up and stretch properly before activity.   Allow for adequate recovery between workouts.   Maintain physical fitness:   Strength, flexibility, and endurance.   Cardiovascular fitness.   Learn and use proper exercise technique.  PROGNOSIS   If treated properly, impingement syndrome usually goes away within 6 weeks. Sometimes surgery is required.   RELATED COMPLICATIONS    Longer healing time if not properly treated, or if not given enough time to heal.   Recurring symptoms, that result in a chronic condition.   Shoulder stiffness, frozen shoulder, or loss of motion.   Rotator cuff tendon tear.  Recurring symptoms, especially if activity is resumed too soon, with overuse, with a direct blow, or when using poor technique.  TREATMENT   Treatment first involves the use of ice and medicine, to reduce pain and inflammation. The use of strengthening and stretching exercises may help reduce pain with activity. These exercises may be performed at home  or with a therapist. If non-surgical treatment is unsuccessful after more than 6 months, surgery may be advised. After surgery and rehabilitation, activity is usually possible in 3 months.   MEDICATION  If pain medicine is needed, nonsteroidal anti-inflammatory medicines (aspirin and ibuprofen), or other minor pain relievers (acetaminophen), are often advised.   Do not take pain medicine for 7 days before surgery.   Prescription pain relievers may be given, if your caregiver thinks they are needed. Use only as directed and only as much as you need.   Corticosteroid injections may be given by your caregiver. These injections should be reserved for the most serious cases, because they may only be given a certain number of times.  HEAT AND COLD  Cold treatment (icing) should be applied for 10 to 15 minutes every 2 to 3 hours for inflammation and pain, and immediately after activity that aggravates your symptoms. Use ice packs or an ice massage.   Heat treatment may be used before performing stretching and strengthening activities prescribed by your caregiver, physical therapist, or athletic trainer. Use a heat pack or a warm water soak.  SEEK MEDICAL CARE IF:    Symptoms get worse or do not improve in 4 to 6 weeks, despite treatment.   New, unexplained symptoms develop. (Drugs used in treatment may produce side effects.)  EXERCISES   RANGE OF MOTION (ROM) AND STRETCHING EXERCISES - Impingement Syndrome (Rotator Cuff  Tendinitis, Bursitis) These exercises may help you when beginning to rehabilitate your injury. Your symptoms may go away with or without further involvement from your physician, physical therapist or athletic trainer. While completing these exercises, remember:    Restoring tissue flexibility helps normal motion to return to the joints. This allows healthier, less painful movement and activity.   An effective stretch should be held for at least 30 seconds.   A stretch should  never be painful. You should only feel a gentle lengthening or release in the stretched tissue.  STRETCH  Flexion, Standing  Stand with good posture. With an underhand grip on your right / left hand, and an overhand grip on the opposite hand, grasp a broomstick or cane so that your hands are a little more than shoulder width apart.   Keeping your right / left elbow straight and shoulder muscles relaxed, push the stick with your opposite hand, to raise your right / left arm in front of your body and then overhead. Raise your arm until you feel a stretch in your right / left shoulder, but before you have increased shoulder pain.   Try to avoid shrugging your right / left shoulder as your arm rises, by keeping your shoulder blade tucked down and toward your mid-back spine. Hold for __________ seconds.   Slowly return to the starting position.  Repeat __________ times. Complete this exercise __________ times per day. STRETCH  Abduction, Supine  Lie on your back. With an underhand grip on your right / left hand and an overhand grip on the opposite hand, grasp a broomstick or cane so that your hands are a little more than shoulder width apart.   Keeping your right / left  elbow straight and your shoulder muscles relaxed, push the stick with your opposite hand, to raise your right / left arm out to the side of your body and then overhead. Raise your arm until you feel a stretch in your right / left shoulder, but before you have increased shoulder pain.   Try to avoid shrugging your right / left shoulder as your arm rises, by keeping your shoulder blade tucked down and toward your mid-back spine. Hold for __________ seconds.   Slowly return to the starting position.  Repeat __________ times. Complete this exercise __________ times per day. ROM  Flexion, Active-Assisted  Lie on your back. You may bend your knees for comfort.   Grasp a broomstick or cane so your hands are about shoulder width apart.  Your right / left hand should grip the end of the stick, so that your hand is positioned "thumbs-up," as if you were about to shake hands.   Using your healthy arm to lead, raise your right / left arm overhead, until you feel a gentle stretch in your shoulder. Hold for __________ seconds.   Use the stick to assist in returning your right / left arm to its starting position.  Repeat __________ times. Complete this exercise __________ times per day.   ROM - Internal Rotation, Supine   Lie on your back on a firm surface. Place your right / left elbow about 60 degrees away from your side. Elevate your elbow with a folded towel, so that the elbow and shoulder are the same height.   Using a broomstick or cane and your strong arm, pull your right / left hand toward your body until you feel a gentle stretch, but no increase in your shoulder pain. Keep your shoulder and elbow in place throughout the exercise.   Hold for __________ seconds. Slowly return to the starting position.  Repeat __________ times. Complete this exercise __________ times per day. STRETCH - Internal Rotation  Place your right / left hand behind your back, palm up.   Throw a towel or belt over your opposite shoulder. Grasp the towel with your right / left hand.   While keeping an upright posture, gently pull up on the towel, until you feel a stretch in the front of your right / left shoulder.   Avoid shrugging your right / left shoulder as your arm rises, by keeping your shoulder blade tucked down and toward your mid-back spine.   Hold for __________ seconds. Release the stretch, by lowering your healthy hand.  Repeat __________ times. Complete this exercise __________ times per day. ROM - Internal Rotation   Using an underhand grip, grasp a stick behind your back with both hands.   While standing upright with good posture, slide the stick up your back until you feel a mild stretch in the front of your shoulder.   Hold for  __________ seconds. Slowly return to your starting position.  Repeat __________ times. Complete this exercise __________ times per day.   STRETCH  Posterior Shoulder Capsule   Stand or sit with good posture. Grasp your right / left elbow and draw it across your chest, keeping it at the same height as your shoulder.   Pull your elbow, so your upper arm comes in closer to your chest. Pull until you feel a gentle stretch in the back of your shoulder.   Hold for __________ seconds.  Repeat __________ times. Complete this exercise __________ times per day. STRENGTHENING EXERCISES - Impingement Syndrome (Rotator  Cuff Tendinitis, Bursitis) These exercises may help you when beginning to rehabilitate your injury. They may resolve your symptoms with or without further involvement from your physician, physical therapist or athletic trainer. While completing these exercises, remember:  Muscles can gain both the endurance and the strength needed for everyday activities through controlled exercises.   Complete these exercises as instructed by your physician, physical therapist or athletic trainer. Increase the resistance and repetitions only as guided.   You may experience muscle soreness or fatigue, but the pain or discomfort you are trying to eliminate should never worsen during these exercises. If this pain does get worse, stop and make sure you are following the directions exactly. If the pain is still present after adjustments, discontinue the exercise until you can discuss the trouble with your clinician.   During your recovery, avoid activity or exercises which involve actions that place your injured hand or elbow above your head or behind your back or head. These positions stress the tissues which you are trying to heal.  STRENGTH - Scapular Depression and Adduction   With good posture, sit on a firm chair. Support your arms in front of you, with pillows, arm rests, or on a table top. Have your  elbows in line with the sides of your body.   Gently draw your shoulder blades down and toward your mid-back spine. Gradually increase the tension, without tensing the muscles along the top of your shoulders and the back of your neck.   Hold for __________ seconds. Slowly release the tension and relax your muscles completely before starting the next repetition.   After you have practiced this exercise, remove the arm support and complete the exercise in standing as well as sitting position.  Repeat __________ times. Complete this exercise __________ times per day.   STRENGTH - Shoulder Abductors, Isometric  With good posture, stand or sit about 4-6 inches from a wall, with your right / left side facing the wall.   Bend your right / left elbow. Gently press your right / left elbow into the wall. Increase the pressure gradually, until you are pressing as hard as you can, without shrugging your shoulder or increasing any shoulder discomfort.   Hold for __________ seconds.   Release the tension slowly. Relax your shoulder muscles completely before you begin the next repetition.  Repeat __________ times. Complete this exercise __________ times per day.   STRENGTH - External Rotators, Isometric  Keep your right / left elbow at your side and bend it 90 degrees.   Step into a door frame so that the outside of your right / left wrist can press against the door frame without your upper arm leaving your side.   Gently press your right / left wrist into the door frame, as if you were trying to swing the back of your hand away from your stomach. Gradually increase the tension, until you are pressing as hard as you can, without shrugging your shoulder or increasing any shoulder discomfort.   Hold for __________ seconds.   Release the tension slowly. Relax your shoulder muscles completely before you begin the next repetition.  Repeat __________ times. Complete this exercise __________ times per day.     STRENGTH - Supraspinatus   Stand or sit with good posture. Grasp a __________ weight, or an exercise band or tubing, so that your hand is "thumbs-up," like you are shaking hands.   Slowly lift your right / left arm in a "V" away from your  thigh, diagonally into the space between your side and straight ahead. Lift your hand to shoulder height or as far as you can, without increasing any shoulder pain. At first, many people do not lift their hands above shoulder height.   Avoid shrugging your right / left shoulder as your arm rises, by keeping your shoulder blade tucked down and toward your mid-back spine.   Hold for __________ seconds. Control the descent of your hand, as you slowly return to your starting position.  Repeat __________ times. Complete this exercise __________ times per day.   STRENGTH - External Rotators  Secure a rubber exercise band or tubing to a fixed object (table, pole) so that it is at the same height as your right / left elbow when you are standing or sitting on a firm surface.   Stand or sit so that the secured exercise band is at your uninjured side.   Bend your right / left elbow 90 degrees. Place a folded towel or small pillow under your right / left arm, so that your elbow is a few inches away from your side.   Keeping the tension on the exercise band, pull it away from your body, as if pivoting on your elbow. Be sure to keep your body steady, so that the movement is coming only from your rotating shoulder.   Hold for __________ seconds. Release the tension in a controlled manner, as you return to the starting position.  Repeat __________ times. Complete this exercise __________ times per day.   STRENGTH - Internal Rotators   Secure a rubber exercise band or tubing to a fixed object (table, pole) so that it is at the same height as your right / left elbow when you are standing or sitting on a firm surface.   Stand or sit so that the secured exercise band is at  your right / left side.   Bend your elbow 90 degrees. Place a folded towel or small pillow under your right / left arm so that your elbow is a few inches away from your side.   Keeping the tension on the exercise band, pull it across your body, toward your stomach. Be sure to keep your body steady, so that the movement is coming only from your rotating shoulder.   Hold for __________ seconds. Release the tension in a controlled manner, as you return to the starting position.  Repeat __________ times. Complete this exercise __________ times per day.   STRENGTH - Scapular Protractors, Standing   Stand arms length away from a wall. Place your hands on the wall, keeping your elbows straight.   Begin by dropping your shoulder blades down and toward your mid-back spine.   To strengthen your protractors, keep your shoulder blades down, but slide them forward on your rib cage. It will feel as if you are lifting the back of your rib cage away from the wall. This is a subtle motion and can be challenging to complete. Ask your caregiver for further instruction, if you are not sure you are doing the exercise correctly.   Hold for __________ seconds. Slowly return to the starting position, resting the muscles completely before starting the next repetition.  Repeat __________ times. Complete this exercise __________ times per day. STRENGTH - Scapular Protractors, Supine  Lie on your back on a firm surface. Extend your right / left arm straight into the air while holding a __________ weight in your hand.   Keeping your head and back in  place, lift your shoulder off the floor.   Hold for __________ seconds. Slowly return to the starting position, and allow your muscles to relax completely before starting the next repetition.  Repeat __________ times. Complete this exercise __________ times per day. STRENGTH - Scapular Protractors, Quadruped  Get onto your hands and knees, with your shoulders directly  over your hands (or as close as you can be, comfortably).   Keeping your elbows locked, lift the back of your rib cage up into your shoulder blades, so your mid-back rounds out. Keep your neck muscles relaxed.   Hold this position for __________ seconds. Slowly return to the starting position and allow your muscles to relax completely before starting the next repetition.  Repeat __________ times. Complete this exercise __________ times per day.   STRENGTH - Scapular Retractors  Secure a rubber exercise band or tubing to a fixed object (table, pole), so that it is at the height of your shoulders when you are either standing, or sitting on a firm armless chair.   With a palm down grip, grasp an end of the band in each hand. Straighten your elbows and lift your hands straight in front of you, at shoulder height. Step back, away from the secured end of the band, until it becomes tense.   Squeezing your shoulder blades together, draw your elbows back toward your sides, as you bend them. Keep your upper arms lifted away from your body throughout the exercise.   Hold for __________ seconds. Slowly ease the tension on the band, as you reverse the directions and return to the starting position.  Repeat __________ times. Complete this exercise __________ times per day. STRENGTH - Shoulder Extensors   Secure a rubber exercise band or tubing to a fixed object (table, pole) so that it is at the height of your shoulders when you are either standing, or sitting on a firm armless chair.   With a thumbs-up grip, grasp an end of the band in each hand. Straighten your elbows and lift your hands straight in front of you, at shoulder height. Step back, away from the secured end of the band, until it becomes tense.   Squeezing your shoulder blades together, pull your hands down to the sides of your thighs. Do not allow your hands to go behind you.   Hold for __________ seconds. Slowly ease the tension on the band,  as you reverse the directions and return to the starting position.  Repeat __________ times. Complete this exercise __________ times per day.   STRENGTH - Scapular Retractors and External Rotators   Secure a rubber exercise band or tubing to a fixed object (table, pole) so that it is at the height as your shoulders, when you are either standing, or sitting on a firm armless chair.   With a palm down grip, grasp an end of the band in each hand. Bend your elbows 90 degrees and lift your elbows to shoulder height, at your sides. Step back, away from the secured end of the band, until it becomes tense.   Squeezing your shoulder blades together, rotate your shoulders so that your upper arms and elbows remain stationary, but your fists travel upward to head height.   Hold for __________ seconds. Slowly ease the tension on the band, as you reverse the directions and return to the starting position.  Repeat __________ times. Complete this exercise __________ times per day.   STRENGTH - Scapular Retractors and External Rotators, Rowing   Secure  a rubber exercise band or tubing to a fixed object (table, pole) so that it is at the height of your shoulders, when you are either standing, or sitting on a firm armless chair.   With a palm down grip, grasp an end of the band in each hand. Straighten your elbows and lift your hands straight in front of you, at shoulder height. Step back, away from the secured end of the band, until it becomes tense.   Step 1: Squeeze your shoulder blades together. Bending your elbows, draw your hands to your chest, as if you are rowing a boat. At the end of this motion, your hands and elbow should be at shoulder height and your elbows should be out to your sides.   Step 2: Rotate your shoulders, to raise your hands above your head. Your forearms should be vertical and your upper arms should be horizontal.   Hold for __________ seconds. Slowly ease the tension on the band, as  you reverse the directions and return to the starting position.  Repeat __________ times. Complete this exercise __________ times per day.   STRENGTH  Scapular Depressors  Find a sturdy chair without wheels, such as a dining room chair.   Keeping your feet on the floor, and your hands on the chair arms, lift your bottom up from the seat, and lock your elbows.   Keeping your elbows straight, allow gravity to pull your body weight down. Your shoulders will rise toward your ears.   Raise your body against gravity by drawing your shoulder blades down your back, shortening the distance between your shoulders and ears. Although your feet should always maintain contact with the floor, your feet should progressively support less body weight, as you get stronger.   Hold for __________ seconds. In a controlled and slow manner, lower your body weight to begin the next repetition.  Repeat __________ times. Complete this exercise __________ times per day.   Document Released: 09/28/2005 Document Revised: 12/21/2011 Document Reviewed: 01/10/2009 [facility] Patient Information 2013 [place], [place].   Impingement Syndrome, Rotator Cuff, Bursitis with Rehab Impingement syndrome is a condition that involves inflammation of the tendons of the rotator cuff and the subacromial bursa, that causes pain in the shoulder. The rotator cuff consists of four tendons and muscles that control much of the shoulder and upper arm function. The subacromial bursa is a fluid filled sac that helps reduce friction between the rotator cuff and one of the bones of the shoulder (acromion). Impingement syndrome is usually an overuse injury that causes swelling of the bursa (bursitis), swelling of the tendon (tendonitis), and/or a tear of the tendon (strain). Strains are classified into three categories. Grade 1 strains cause pain, but the tendon is not lengthened. Grade 2 strains include a lengthened ligament, due to the ligament being  stretched or partially ruptured. With grade 2 strains there is still function, although the function may be decreased. Grade 3 strains include a complete tear of the tendon or muscle, and function is usually impaired. SYMPTOMS    Pain around the shoulder, often at the outer portion of the upper arm.   Pain that gets worse with shoulder function, especially when reaching overhead or lifting.   Sometimes, aching when not using the arm.   Pain that wakes you up at night.   Sometimes, tenderness, swelling, warmth, or redness over the affected area.   Loss of strength.   Limited motion of the shoulder, especially reaching behind the back (to the  back pocket or to unhook bra) or across your body.   Crackling sound (crepitation) when moving the arm.   Biceps tendon pain and inflammation (in the front of the shoulder). Worse when bending the elbow or lifting.  CAUSES   Impingement syndrome is often an overuse injury, in which chronic (repetitive) motions cause the tendons or bursa to become inflamed. A strain occurs when a force is paced on the tendon or muscle that is greater than it can withstand. Common mechanisms of injury include: Stress from sudden increase in duration, frequency, or intensity of training.  Direct hit (trauma) to the shoulder.   Aging, erosion of the tendon with normal use.   Bony bump on shoulder (acromial spur).  RISK INCREASES WITH:  Contact sports (football, wrestling, boxing).   Throwing sports (baseball, tennis, volleyball).   Weightlifting and bodybuilding.   Heavy labor.   Previous injury to the rotator cuff, including impingement.   Poor shoulder strength and flexibility.   Failure to warm up properly before activity.   Inadequate protective equipment.   Old age.   Bony bump on shoulder (acromial spur).  PREVENTION    Warm up and stretch properly before activity.   Allow for adequate recovery between workouts.   Maintain physical  fitness:   Strength, flexibility, and endurance.   Cardiovascular fitness.   Learn and use proper exercise technique.  PROGNOSIS   If treated properly, impingement syndrome usually goes away within 6 weeks. Sometimes surgery is required.   RELATED COMPLICATIONS    Longer healing time if not properly treated, or if not given enough time to heal.   Recurring symptoms, that result in a chronic condition.   Shoulder stiffness, frozen shoulder, or loss of motion.   Rotator cuff tendon tear.   Recurring symptoms, especially if activity is resumed too soon, with overuse, with a direct blow, or when using poor technique.  TREATMENT   Treatment first involves the use of ice and medicine, to reduce pain and inflammation. The use of strengthening and stretching exercises may help reduce pain with activity. These exercises may be performed at home or with a therapist. If non-surgical treatment is unsuccessful after more than 6 months, surgery may be advised. After surgery and rehabilitation, activity is usually possible in 3 months.   MEDICATION  If pain medicine is needed, nonsteroidal anti-inflammatory medicines (aspirin and ibuprofen), or other minor pain relievers (acetaminophen), are often advised.   Do not take pain medicine for 7 days before surgery.   Prescription pain relievers may be given, if your caregiver thinks they are needed. Use only as directed and only as much as you need.   Corticosteroid injections may be given by your caregiver. These injections should be reserved for the most serious cases, because they may only be given a certain number of times.  HEAT AND COLD  Cold treatment (icing) should be applied for 10 to 15 minutes every 2 to 3 hours for inflammation and pain, and immediately after activity that aggravates your symptoms. Use ice packs or an ice massage.   Heat treatment may be used before performing stretching and strengthening activities prescribed by your  caregiver, physical therapist, or athletic trainer. Use a heat pack or a warm water soak.  SEEK MEDICAL CARE IF:    Symptoms get worse or do not improve in 4 to 6 weeks, despite treatment.   New, unexplained symptoms develop. (Drugs used in treatment may produce side effects.)  EXERCISES  do 3 sets  of 10  RANGE OF MOTION (ROM) AND STRETCHING EXERCISES - Impingement Syndrome (Rotator Cuff  Tendinitis, Bursitis) These exercises may help you when beginning to rehabilitate your injury. Your symptoms may go away with or without further involvement from your physician, physical therapist or athletic trainer. While completing these exercises, remember:    Restoring tissue flexibility helps normal motion to return to the joints. This allows healthier, less painful movement and activity.   An effective stretch should be held for at least 30 seconds.   A stretch should never be painful. You should only feel a gentle lengthening or release in the stretched tissue.  STRETCH  Flexion, Standing  Stand with good posture. With an underhand grip on your right / left hand, and an overhand grip on the opposite hand, grasp a broomstick or cane so that your hands are a little more than shoulder width apart.   Keeping your right / left elbow straight and shoulder muscles relaxed, push the stick with your opposite hand, to raise your right / left arm in front of your body and then overhead. Raise your arm until you feel a stretch in your right / left shoulder, but before you have increased shoulder pain.   Try to avoid shrugging your right / left shoulder as your arm rises, by keeping your shoulder blade tucked down and toward your mid-back spine. Hold for __________ seconds.   Slowly return to the starting position.  Repeat __________ times. Complete this exercise __________ times per day. STRETCH  Abduction, Supine  Lie on your back. With an underhand grip on your right / left hand and an overhand grip on the  opposite hand, grasp a broomstick or cane so that your hands are a little more than shoulder width apart.   Keeping your right / left elbow straight and your shoulder muscles relaxed, push the stick with your opposite hand, to raise your right / left arm out to the side of your body and then overhead. Raise your arm until you feel a stretch in your right / left shoulder, but before you have increased shoulder pain.   Try to avoid shrugging your right / left shoulder as your arm rises, by keeping your shoulder blade tucked down and toward your mid-back spine. Hold for __________ seconds.   Slowly return to the starting position.  Repeat __________ times. Complete this exercise __________ times per day. ROM  Flexion, Active-Assisted  Lie on your back. You may bend your knees for comfort.   Grasp a broomstick or cane so your hands are about shoulder width apart. Your right / left hand should grip the end of the stick, so that your hand is positioned "thumbs-up," as if you were about to shake hands.   Using your healthy arm to lead, raise your right / left arm overhead, until you feel a gentle stretch in your shoulder. Hold for __________ seconds.   Use the stick to assist in returning your right / left arm to its starting position.  Repeat __________ times. Complete this exercise __________ times per day.   ROM - Internal Rotation, Supine   Lie on your back on a firm surface. Place your right / left elbow about 60 degrees away from your side. Elevate your elbow with a folded towel, so that the elbow and shoulder are the same height.   Using a broomstick or cane and your strong arm, pull your right / left hand toward your body until you feel a gentle  stretch, but no increase in your shoulder pain. Keep your shoulder and elbow in place throughout the exercise.   Hold for __________ seconds. Slowly return to the starting position.  Repeat __________ times. Complete this exercise __________ times  per day. STRETCH - Internal Rotation  Place your right / left hand behind your back, palm up.   Throw a towel or belt over your opposite shoulder. Grasp the towel with your right / left hand.   While keeping an upright posture, gently pull up on the towel, until you feel a stretch in the front of your right / left shoulder.   Avoid shrugging your right / left shoulder as your arm rises, by keeping your shoulder blade tucked down and toward your mid-back spine.   Hold for __________ seconds. Release the stretch, by lowering your healthy hand.  Repeat __________ times. Complete this exercise __________ times per day. ROM - Internal Rotation   Using an underhand grip, grasp a stick behind your back with both hands.   While standing upright with good posture, slide the stick up your back until you feel a mild stretch in the front of your shoulder.   Hold for __________ seconds. Slowly return to your starting position.  Repeat __________ times. Complete this exercise __________ times per day.   STRETCH  Posterior Shoulder Capsule   Stand or sit with good posture. Grasp your right / left elbow and draw it across your chest, keeping it at the same height as your shoulder.   Pull your elbow, so your upper arm comes in closer to your chest. Pull until you feel a gentle stretch in the back of your shoulder.   Hold for __________ seconds.  Repeat __________ times. Complete this exercise __________ times per day. STRENGTHENING EXERCISES - Impingement Syndrome (Rotator Cuff Tendinitis, Bursitis) These exercises may help you when beginning to rehabilitate your injury. They may resolve your symptoms with or without further involvement from your physician, physical therapist or athletic trainer. While completing these exercises, remember:  Muscles can gain both the endurance and the strength needed for everyday activities through controlled exercises.   Complete these exercises as instructed by  your physician, physical therapist or athletic trainer. Increase the resistance and repetitions only as guided.   You may experience muscle soreness or fatigue, but the pain or discomfort you are trying to eliminate should never worsen during these exercises. If this pain does get worse, stop and make sure you are following the directions exactly. If the pain is still present after adjustments, discontinue the exercise until you can discuss the trouble with your clinician.   During your recovery, avoid activity or exercises which involve actions that place your injured hand or elbow above your head or behind your back or head. These positions stress the tissues which you are trying to heal.  STRENGTH - Scapular Depression and Adduction   With good posture, sit on a firm chair. Support your arms in front of you, with pillows, arm rests, or on a table top. Have your elbows in line with the sides of your body.   Gently draw your shoulder blades down and toward your mid-back spine. Gradually increase the tension, without tensing the muscles along the top of your shoulders and the back of your neck.   Hold for __________ seconds. Slowly release the tension and relax your muscles completely before starting the next repetition.   After you have practiced this exercise, remove the arm support and complete the  exercise in standing as well as sitting position.  Repeat __________ times. Complete this exercise __________ times per day.   STRENGTH - Shoulder Abductors, Isometric  With good posture, stand or sit about 4-6 inches from a wall, with your right / left side facing the wall.   Bend your right / left elbow. Gently press your right / left elbow into the wall. Increase the pressure gradually, until you are pressing as hard as you can, without shrugging your shoulder or increasing any shoulder discomfort.   Hold for __________ seconds.   Release the tension slowly. Relax your shoulder muscles  completely before you begin the next repetition.  Repeat __________ times. Complete this exercise __________ times per day.   STRENGTH - External Rotators, Isometric  Keep your right / left elbow at your side and bend it 90 degrees.   Step into a door frame so that the outside of your right / left wrist can press against the door frame without your upper arm leaving your side.   Gently press your right / left wrist into the door frame, as if you were trying to swing the back of your hand away from your stomach. Gradually increase the tension, until you are pressing as hard as you can, without shrugging your shoulder or increasing any shoulder discomfort.   Hold for __________ seconds.   Release the tension slowly. Relax your shoulder muscles completely before you begin the next repetition.  Repeat __________ times. Complete this exercise __________ times per day.   STRENGTH - Supraspinatus   Stand or sit with good posture. Grasp a __________ weight, or an exercise band or tubing, so that your hand is "thumbs-up," like you are shaking hands.   Slowly lift your right / left arm in a "V" away from your thigh, diagonally into the space between your side and straight ahead. Lift your hand to shoulder height or as far as you can, without increasing any shoulder pain. At first, many people do not lift their hands above shoulder height.   Avoid shrugging your right / left shoulder as your arm rises, by keeping your shoulder blade tucked down and toward your mid-back spine.   Hold for __________ seconds. Control the descent of your hand, as you slowly return to your starting position.  Repeat __________ times. Complete this exercise __________ times per day.   STRENGTH - External Rotators  Secure a rubber exercise band or tubing to a fixed object (table, pole) so that it is at the same height as your right / left elbow when you are standing or sitting on a firm surface.   Stand or sit so that the  secured exercise band is at your uninjured side.   Bend your right / left elbow 90 degrees. Place a folded towel or small pillow under your right / left arm, so that your elbow is a few inches away from your side.   Keeping the tension on the exercise band, pull it away from your body, as if pivoting on your elbow. Be sure to keep your body steady, so that the movement is coming only from your rotating shoulder.   Hold for __________ seconds. Release the tension in a controlled manner, as you return to the starting position.  Repeat __________ times. Complete this exercise __________ times per day.   STRENGTH - Internal Rotators   Secure a rubber exercise band or tubing to a fixed object (table, pole) so that it is at the same height  as your right / left elbow when you are standing or sitting on a firm surface.   Stand or sit so that the secured exercise band is at your right / left side.   Bend your elbow 90 degrees. Place a folded towel or small pillow under your right / left arm so that your elbow is a few inches away from your side.   Keeping the tension on the exercise band, pull it across your body, toward your stomach. Be sure to keep your body steady, so that the movement is coming only from your rotating shoulder.   Hold for __________ seconds. Release the tension in a controlled manner, as you return to the starting position.  Repeat __________ times. Complete this exercise __________ times per day.   STRENGTH - Scapular Protractors, Standing   Stand arms length away from a wall. Place your hands on the wall, keeping your elbows straight.   Begin by dropping your shoulder blades down and toward your mid-back spine.   To strengthen your protractors, keep your shoulder blades down, but slide them forward on your rib cage. It will feel as if you are lifting the back of your rib cage away from the wall. This is a subtle motion and can be challenging to complete. Ask your caregiver for  further instruction, if you are not sure you are doing the exercise correctly.   Hold for __________ seconds. Slowly return to the starting position, resting the muscles completely before starting the next repetition.  Repeat __________ times. Complete this exercise __________ times per day. STRENGTH - Scapular Protractors, Supine  Lie on your back on a firm surface. Extend your right / left arm straight into the air while holding a __________ weight in your hand.   Keeping your head and back in place, lift your shoulder off the floor.   Hold for __________ seconds. Slowly return to the starting position, and allow your muscles to relax completely before starting the next repetition.  Repeat __________ times. Complete this exercise __________ times per day. STRENGTH - Scapular Protractors, Quadruped  Get onto your hands and knees, with your shoulders directly over your hands (or as close as you can be, comfortably).   Keeping your elbows locked, lift the back of your rib cage up into your shoulder blades, so your mid-back rounds out. Keep your neck muscles relaxed.   Hold this position for __________ seconds. Slowly return to the starting position and allow your muscles to relax completely before starting the next repetition.  Repeat __________ times. Complete this exercise __________ times per day.   STRENGTH - Scapular Retractors  Secure a rubber exercise band or tubing to a fixed object (table, pole), so that it is at the height of your shoulders when you are either standing, or sitting on a firm armless chair.   With a palm down grip, grasp an end of the band in each hand. Straighten your elbows and lift your hands straight in front of you, at shoulder height. Step back, away from the secured end of the band, until it becomes tense.   Squeezing your shoulder blades together, draw your elbows back toward your sides, as you bend them. Keep your upper arms lifted away from your body  throughout the exercise.   Hold for __________ seconds. Slowly ease the tension on the band, as you reverse the directions and return to the starting position.  Repeat __________ times. Complete this exercise __________ times per day. STRENGTH - Shoulder  Extensors   Secure a rubber exercise band or tubing to a fixed object (table, pole) so that it is at the height of your shoulders when you are either standing, or sitting on a firm armless chair.   With a thumbs-up grip, grasp an end of the band in each hand. Straighten your elbows and lift your hands straight in front of you, at shoulder height. Step back, away from the secured end of the band, until it becomes tense.   Squeezing your shoulder blades together, pull your hands down to the sides of your thighs. Do not allow your hands to go behind you.   Hold for __________ seconds. Slowly ease the tension on the band, as you reverse the directions and return to the starting position.  Repeat __________ times. Complete this exercise __________ times per day.   STRENGTH - Scapular Retractors and External Rotators   Secure a rubber exercise band or tubing to a fixed object (table, pole) so that it is at the height as your shoulders, when you are either standing, or sitting on a firm armless chair.   With a palm down grip, grasp an end of the band in each hand. Bend your elbows 90 degrees and lift your elbows to shoulder height, at your sides. Step back, away from the secured end of the band, until it becomes tense.   Squeezing your shoulder blades together, rotate your shoulders so that your upper arms and elbows remain stationary, but your fists travel upward to head height.   Hold for __________ seconds. Slowly ease the tension on the band, as you reverse the directions and return to the starting position.  Repeat __________ times. Complete this exercise __________ times per day.   STRENGTH - Scapular Retractors and External Rotators, Rowing     Secure a rubber exercise band or tubing to a fixed object (table, pole) so that it is at the height of your shoulders, when you are either standing, or sitting on a firm armless chair.   With a palm down grip, grasp an end of the band in each hand. Straighten your elbows and lift your hands straight in front of you, at shoulder height. Step back, away from the secured end of the band, until it becomes tense.   Step 1: Squeeze your shoulder blades together. Bending your elbows, draw your hands to your chest, as if you are rowing a boat. At the end of this motion, your hands and elbow should be at shoulder height and your elbows should be out to your sides.   Step 2: Rotate your shoulders, to raise your hands above your head. Your forearms should be vertical and your upper arms should be horizontal.   Hold for __________ seconds. Slowly ease the tension on the band, as you reverse the directions and return to the starting position.  Repeat __________ times. Complete this exercise __________ times per day.   STRENGTH  Scapular Depressors  Find a sturdy chair without wheels, such as a dining room chair.   Keeping your feet on the floor, and your hands on the chair arms, lift your bottom up from the seat, and lock your elbows.   Keeping your elbows straight, allow gravity to pull your body weight down. Your shoulders will rise toward your ears.   Raise your body against gravity by drawing your shoulder blades down your back, shortening the distance between your shoulders and ears. Although your feet should always maintain contact with the floor, your  feet should progressively support less body weight, as you get stronger.   Hold for __________ seconds. In a controlled and slow manner, lower your body weight to begin the next repetition.  Repeat __________ times. Complete this exercise __________ times per day.   Document Released: 09/28/2005 Document Revised: 12/21/2011 Document Reviewed:  01/10/2009 [facility] Patient Information 2013 [place], [place].

## 2012-08-05 ENCOUNTER — Other Ambulatory Visit: Payer: Self-pay | Admitting: Internal Medicine

## 2012-08-05 DIAGNOSIS — M549 Dorsalgia, unspecified: Secondary | ICD-10-CM

## 2012-08-11 ENCOUNTER — Ambulatory Visit
Admission: RE | Admit: 2012-08-11 | Discharge: 2012-08-11 | Disposition: A | Discharge status: Home / Self Care | Payer: Medicare Other | Source: Ambulatory Visit | Attending: Internal Medicine | Admitting: Internal Medicine

## 2012-08-11 DIAGNOSIS — M549 Dorsalgia, unspecified: Secondary | ICD-10-CM

## 2012-09-19 ENCOUNTER — Ambulatory Visit (HOSPITAL_COMMUNITY)
Admission: RE | Admit: 2012-09-19 | Discharge: 2012-09-19 | Disposition: A | Discharge status: Home / Self Care | Payer: Medicare Other | Source: Ambulatory Visit | Attending: Neurosurgery | Admitting: Neurosurgery

## 2012-09-19 DIAGNOSIS — M6281 Muscle weakness (generalized): Secondary | ICD-10-CM | POA: Insufficient documentation

## 2012-09-19 DIAGNOSIS — IMO0001 Reserved for inherently not codable concepts without codable children: Secondary | ICD-10-CM | POA: Insufficient documentation

## 2012-09-19 DIAGNOSIS — G8929 Other chronic pain: Secondary | ICD-10-CM | POA: Insufficient documentation

## 2012-09-19 DIAGNOSIS — M79609 Pain in unspecified limb: Secondary | ICD-10-CM | POA: Insufficient documentation

## 2012-09-19 DIAGNOSIS — M25619 Stiffness of unspecified shoulder, not elsewhere classified: Secondary | ICD-10-CM | POA: Insufficient documentation

## 2012-09-19 DIAGNOSIS — R29898 Other symptoms and signs involving the musculoskeletal system: Secondary | ICD-10-CM | POA: Insufficient documentation

## 2012-09-19 DIAGNOSIS — R262 Difficulty in walking, not elsewhere classified: Secondary | ICD-10-CM | POA: Insufficient documentation

## 2012-09-19 DIAGNOSIS — M549 Dorsalgia, unspecified: Secondary | ICD-10-CM | POA: Insufficient documentation

## 2012-09-19 DIAGNOSIS — M25519 Pain in unspecified shoulder: Secondary | ICD-10-CM | POA: Insufficient documentation

## 2012-09-19 NOTE — Evaluation (Signed)
Physical Therapy Evaluation  Patient Details  Name: TYNER CODNER MRN: [id number] Date of Birth: October 15, 1940  Today's Date: 09/19/2012 Time: 0850-0934 PT Time Calculation (min): 44 min  Visit#: 1  of 12   Re-eval: 10/19/12 Assessment Diagnosis: lumbar spondylosis Next MD Visit: 10/26/2012 Prior Therapy: none  Authorization: medicare  Authorization Visit#: 1  of 10    Past Medical History:  Past Medical History  Diagnosis Date  . Hypertension   . Coronary artery disease    Past Surgical History:  Past Surgical History  Procedure Date  . Colonoscopy 07/31/2011    Procedure: COLONOSCOPY;  Surgeon: Dalia Heading;  [facility];  Service: Gastroenterology;  Laterality: N/A;    Subjective Symptoms/Limitations Symptoms: Mr. Mcclintock states that he has had low back pain that was  worse on the right side for several years with radicular pain to the foot.   He states in the summer he was walking for about a half mile down the beach when his left leg starting huring so bad that he did not think that he would get back to the condo. He had to spend the rest of the vacation in bed and had to go home lying down in the back seat of the car.   This intense pain slowly resolved but he continues to have chronic pain.  He states that he now has been having radicular pain down his left leg; not his right.  He had a new MRI which showed significant degeneration.  He is now being referred to therapy for core strengthening and postural education. How long can you sit comfortably?: able to sit for an hour. How long can you stand comfortably?: for 15 minutes. How long can you walk comfortably?: 30 minutes  Pain Assessment Currently in Pain?: Yes Pain Score:   4 (worst 8/10 best 3/10) Pain Location: Back Pain Orientation: Left;Lower Pain Type: Chronic pain Pain Radiating Towards: L leg to calf level Pain Onset: More than a month ago Pain Frequency:  Intermittent  Cognition/Observation Cognition Overall Cognitive Status: Appears within functional limits for tasks assessed  Sensation/Coordination/Flexibility/Functional Tests Functional Tests Functional Tests: oswestry 18/50  Assessment RLE Strength Right Hip Flexion: 5/5 Right Hip Extension: 4/5 Right Hip ABduction: 4/5 Right Knee Flexion: 5/5 Right Knee Extension: 5/5 Right Ankle Dorsiflexion:  (4-/5) LLE Strength Left Hip Flexion: 5/5 Left Hip Extension:  (4+/5) Left Hip ABduction:  (4+/5) Left Knee Flexion: 5/5 Left Knee Extension:  (4+/5) Left Ankle Dorsiflexion:  (4-/5) Palpation Palpation: tight lumbar mm  Exercise/Treatments Mobility/Balance  Posture/Postural Control Posture/Postural Control: Postural limitations Postural Limitations: pt keeps COG behind BOS; forward head and rounded shlds.   Stretches Active Hamstring Stretch: 3 reps;30 seconds Single Knee to Chest Stretch: 3 reps;30 seconds Standing Extension: 5 reps Seated Other Seated Lumbar Exercises: ab set x 10    Physical Therapy Assessment and Plan PT Assessment and Plan Clinical Impression Statement: Pt with acute exacerbation of chronic low back pain who has postural changes and weakened core mm which are contributing to pt pain.  PT will benefit from skilled  PT to improve pt strength, function and quality of life. Pt will benefit from skilled therapeutic intervention in order to improve on the following deficits: Decreased activity tolerance;Decreased strength;Difficulty walking;Impaired flexibility;Increased fascial restricitons;Pain Rehab Potential: Good PT Frequency: Min 2X/week PT Duration: 6 weeks PT Treatment/Interventions: Therapeutic activities;Therapeutic exercise;Patient/family education;Manual techniques;Modalities PT Plan: begin stab ex of bent knee lift; bridge, clam and SLR next visit.  Standing feet against  wall proper posture w/ B UE flextion for postural control.     Goals Home Exercise Program Pt will Perform Home Exercise Program: Independently PT Short Term Goals Time to Complete Short Term Goals: 2 weeks PT Short Term Goal 1: Pt to state no radicular pain  PT Short Term Goal 2: Pain to be no greater than a 5/10 PT Long Term Goals Time to Complete Long Term Goals: 4 weeks PT Long Term Goal 1: I in advance HEP PT Long Term Goal 2: Pain to be no greater than a 3 70% of the day Long Term Goal 3: Pt able to verbalize the importance of proper body mechanics for back care. Long Term Goal 4: Leg mm strength 5/5 PT Long Term Goal 5: Pt to be able to stand for 30 minutes  Additional PT Long Term Goals?: Yes PT Long Term Goal 6: Pt to be able to walk for an hour PT Long Term Goal 7: Pt oswestry to be improved by 10 points  Problem List Patient Active Problem List  Diagnosis  . Tendonitis of shoulder, right  . Vertigo  . Pain in joint, shoulder region  . Shoulder weakness  . Back pain, chronic  . Leg weakness, bilateral  . Difficulty in walking    PT - End of Session Activity Tolerance: Patient tolerated treatment well General Behavior During Session: [facility] for tasks performed Cognition: [facility] for tasks performed PT Plan of Care PT Home Exercise Plan: given  GP Functional Assessment Tool Used: oswestry Functional Limitation: Mobility: Walking and moving around Mobility: Walking and Moving Around Current Status (Z6109): At least 20 percent but less than 40 percent impaired, limited or restricted Mobility: Walking and Moving Around Goal Status [phone]): At least 1 percent but less than 20 percent impaired, limited or restricted  RUSSELL,CINDY 09/19/2012, 10:51 AM  Physician Documentation Your signature is required to indicate approval of the treatment plan as stated above.  Please sign and either send electronically or make a copy of this report for your files and return this physician signed original.   Please mark one 1.__approve of plan   2. ___approve of plan with the following conditions.   ______________________________                                                          _____________________ Physician Signature                                                                                                             Date

## 2012-09-22 ENCOUNTER — Ambulatory Visit (HOSPITAL_COMMUNITY)
Admission: RE | Admit: 2012-09-22 | Discharge: 2012-09-22 | Disposition: A | Discharge status: Home / Self Care | Payer: Medicare Other | Source: Ambulatory Visit | Attending: Neurosurgery | Admitting: Neurosurgery

## 2012-09-22 DIAGNOSIS — M549 Dorsalgia, unspecified: Secondary | ICD-10-CM

## 2012-09-22 DIAGNOSIS — R29898 Other symptoms and signs involving the musculoskeletal system: Secondary | ICD-10-CM

## 2012-09-22 DIAGNOSIS — R262 Difficulty in walking, not elsewhere classified: Secondary | ICD-10-CM

## 2012-09-22 NOTE — Progress Notes (Signed)
Physical Therapy Treatment Patient Details  Name: Michael [name] MRN: [id number] Date of Birth: [birth year]  Today's Date: 09/22/2012 Time: 0850-0930 PT Time Calculation (min): 40 min  Visit#: 2  of 12   Re-eval: 10/19/12 Charges: Therex x 38'  Authorization: medicare  Authorization Visit#: 2  of 10    Subjective: Symptoms/Limitations Symptoms: Pt stated he is pain free standing, reported painful in morning time takes a while to get going.  L lumbar pain w forward trunk flexion increases to 6-7/10.  Reported compliance with HEP. Pain Assessment Currently in Pain?: No/denies Pain Score:  (painfree standing, increases to 6-7/10 with forward flexion.) Pain Location: Back Pain Orientation: Left;Lower   Exercise/Treatments Stretches Active Hamstring Stretch: 3 reps;30 seconds Single Knee to Chest Stretch: 3 reps;30 seconds Standing Other Standing Lumbar Exercises: posture cueing against wall with shoulder flexion 10x Supine Clam: 10 reps Bent Knee Raise: 10 reps Bridge: 10 reps Straight Leg Raise: 10 reps Sidelying Hip Abduction: 10 reps  Physical Therapy Assessment and Plan PT Assessment and Plan Clinical Impression Statement: Pt completes therex well with minimal need for cueing. Pt presents with sginificant hamsting tightness in B LE. Pt requires multimodal cueing for proper form with SL abduction. PT reprots decrease hip soreness at end of sesion PT Plan: Continue to progress core stability and LE flexibility per PT POC.      Problem List Patient Active Problem List  Diagnosis  . Tendonitis of shoulder, right  . Vertigo  . Pain in joint, shoulder region  . Shoulder weakness  . Back pain, chronic  . Leg weakness, bilateral  . Difficulty in walking    PT - End of Session Activity Tolerance: Patient tolerated treatment well General Behavior During Session: [facility] for tasks performed Cognition: [facility] for tasks performed  GP Functional Assessment Tool Used:  Bertis Ruddy, PTA 09/22/2012, 9:44 AM

## 2012-09-27 ENCOUNTER — Ambulatory Visit (HOSPITAL_COMMUNITY)
Admission: RE | Admit: 2012-09-27 | Discharge: 2012-09-27 | Disposition: A | Discharge status: Home / Self Care | Payer: Medicare Other | Source: Ambulatory Visit | Attending: Neurosurgery | Admitting: Neurosurgery

## 2012-09-27 NOTE — Progress Notes (Signed)
Physical Therapy Treatment Patient Details  Name: Michael [name] MRN: [id number] Date of Birth: 1941-04-03  Today's Date: 09/27/2012 Time: 4098-1191 PT Time Calculation (min): 39 min  Visit#: 3  of 12   Re-eval: 10/19/12 Authorization: medicare  Authorization Visit#: 3  of 10   Charges:  therex 35', MHP X 1 unit  Subjective: Symptoms/Limitations Symptoms: Pt. states his pain remains in B lumbar/hip region, 7/10 on L, 3/10 on R.  Reports compliance with HEP. Pain Assessment Currently in Pain?: Yes Pain Score:   7 Pain Location: Back Pain Orientation: Left;Lower Pain Radiating Towards: L LE to calf area, no radiating pain on R side   Exercise/Treatments Stretches Active Hamstring Stretch: 3 reps;30 seconds Single Knee to Chest Stretch: 3 reps;30 seconds Standing Extension: 5 reps Standing Other Standing Lumbar Exercises: posture cueing against wall with shoulder flexion 10x Supine Clam: 10 reps Bent Knee Raise: 10 reps Bridge: 10 reps Straight Leg Raise: 10 reps   Modalities Modalities: Moist Heat Moist Heat Therapy Number Minutes Moist Heat: 25 Minutes Moist Heat Location:  (MHP with supine therex)  Physical Therapy Assessment and Plan PT Assessment and Plan Clinical Impression Statement: Added standing heel/toe raises with postural cues to isolate mm.  Multimodal cues with supine stab exercises.  Added MHP while performing supine therex to help decrease discomfort and relax mm.  Pt. reported overall decrease in pain at end of session. PT Plan: Continue to progress core stability and LE flexibility per PT POC.      Problem List Patient Active Problem List  Diagnosis  . Tendonitis of shoulder, right  . Vertigo  . Pain in joint, shoulder region  . Shoulder weakness  . Back pain, chronic  . Leg weakness, bilateral  . Difficulty in walking    PT - End of Session Activity Tolerance: Patient tolerated treatment well General Behavior During Session: [name] for  tasks performed Cognition: [facility] for tasks performed  GP Functional Assessment Tool Used: Marland Kitchen, PTA/CLT 09/27/2012, 9:32 AM

## 2012-09-29 ENCOUNTER — Ambulatory Visit (HOSPITAL_COMMUNITY)
Admission: RE | Admit: 2012-09-29 | Discharge: 2012-09-29 | Disposition: A | Discharge status: Home / Self Care | Payer: Medicare Other | Source: Ambulatory Visit | Attending: Neurosurgery | Admitting: Neurosurgery

## 2012-09-29 NOTE — Progress Notes (Signed)
Physical Therapy Treatment Patient Details  Name: Michael [name] MRN: [id number] Date of Birth: Mar 05, 1941  Today's Date: 09/29/2012 Time: 4098-1191 PT Time Calculation (min): 42 min Visit#: 4  of 12   Re-eval: 10/19/12 Authorization: medicare  Authorization Visit#: 4  of 10  Charges:  therex 38', MHP X 1 unit   Subjective: Symptoms/Limitations Symptoms: Pt states that pain in L is now > than R.  States the MHP really helped last visit and stayed less painful since last visit. Pain Assessment Currently in Pain?: Yes Pain Score:   4 Pain Location: Back Pain Orientation: Right;Lower Multiple Pain Sites: Yes   Exercise/Treatments Stretches Active Hamstring Stretch: 3 reps;30 seconds Single Knee to Chest Stretch: 3 reps;30 seconds Standing Extension: 5 reps Standing Scapular Retraction: 10 reps;Theraband Theraband Level (Scapular Retraction): Level 3 (Green) Row: 10 reps;Theraband Theraband Level (Row): Level 3 (Green) Shoulder Extension: 10 reps;Theraband Theraband Level (Shoulder Extension): Level 3 (Green) Other Standing Lumbar Exercises: posture cueing against wall with shoulder flexion 10x Supine Clam: 10 reps Bent Knee Raise: 10 reps Bridge: 10 reps Straight Leg Raise: 10 reps Isometric Hip Flexion: 10 reps;5 reps Other Supine Lumbar Exercises: hip add isometrics 10X5"     Modalities Modalities: Moist Heat Moist Heat Therapy Number Minutes Moist Heat: 30 Minutes Moist Heat Location:  (MHP with supine therex)  Physical Therapy Assessment and Plan PT Assessment and Plan Clinical Impression Statement: Added postural stab exercises with theraband with multimodal cues for posture and form.  New stab /strengthening ex added without difficulty.  Less cues needed with established exercises.  Overall  improvement with little pain remaining in the Right LB area,  Less radiating symptoms down  L LE (now to knee level, was to foot). PT Plan: Continue to progress core  stability and LE flexibility per PT POC.  Begin prone stab exercises.     Problem List Patient Active Problem List  Diagnosis  . Tendonitis of shoulder, right  . Vertigo  . Pain in joint, shoulder region  . Shoulder weakness  . Back pain, chronic  . Leg weakness, bilateral  . Difficulty in walking    PT - End of Session Activity Tolerance: Patient tolerated treatment well General Behavior During Session: [facility] for tasks performed Cognition: [facility] for tasks performed  GP Functional Assessment Tool Used: Marland Kitchen, PTA/CLT 09/29/2012, 9:26 AM

## 2012-10-04 ENCOUNTER — Ambulatory Visit (HOSPITAL_COMMUNITY): Payer: Medicare Other | Admitting: Physical Therapy

## 2012-10-06 ENCOUNTER — Ambulatory Visit (HOSPITAL_COMMUNITY): Payer: Medicare Other | Admitting: *Deleted

## 2012-10-07 ENCOUNTER — Inpatient Hospital Stay (HOSPITAL_COMMUNITY): Admission: RE | Admit: 2012-10-07 | Payer: Medicare Other | Source: Ambulatory Visit | Admitting: Physical Therapy

## 2012-10-11 ENCOUNTER — Ambulatory Visit (HOSPITAL_COMMUNITY)
Admission: RE | Admit: 2012-10-11 | Discharge: 2012-10-11 | Disposition: A | Discharge status: Home / Self Care | Payer: Medicare Other | Source: Ambulatory Visit | Attending: Neurosurgery | Admitting: Neurosurgery

## 2012-10-11 NOTE — Progress Notes (Signed)
Physical Therapy Treatment Patient Details  Name: Michael [name] MRN: [id number] Date of Birth: Feb 20, 1941  Today's Date: 10/11/2012 Time: 4098-1191 PT Time Calculation (min): 42 min  Visit#: 5  of 12   Re-eval: 10/19/12 Charges: Therex x 25' Manual x 15'  Authorization: medicare  Authorization Visit#: 5  of 10    Subjective: Symptoms/Limitations Symptoms: Pt states that pain in L lower back is much worse than the R. Pain Assessment Currently in Pain?: Yes Pain Score:   4 Pain Location: Back Pain Orientation: Right;Lower   Exercise/Treatments Stretches Active Hamstring Stretch: 3 reps;30 seconds Single Knee to Chest Stretch: 3 reps;30 seconds Aerobic Tread Mill: 5' @ 1.5 following MET Standing Scapular Retraction: 10 reps;Theraband Theraband Level (Scapular Retraction): Level 3 (Green) Row: 10 reps;Theraband Theraband Level (Row): Level 3 (Green) Shoulder Extension: 10 reps;Theraband Theraband Level (Shoulder Extension): Level 3 (Green)  Manual Therapy Manual Therapy: Other (comment) Other Manual Therapy: MET completed to SI to correct L anterior rotation  Physical Therapy Assessment and Plan PT Assessment and Plan Clinical Impression Statement: Pt requires multimodal cueing with postural tband exercises to improve form. MET completed by Becky Sax, PTA. Pt presents with decrease pain and improve gait mechanics following MET.  PT Plan: Continue to progress core stability and LE flexibility per PT POC.  Begin prone stab exercises.    Problem List Patient Active Problem List  Diagnosis  . Tendonitis of shoulder, right  . Vertigo  . Pain in joint, shoulder region  . Shoulder weakness  . Back pain, chronic  . Leg weakness, bilateral  . Difficulty in walking    PT - End of Session Activity Tolerance: Patient tolerated treatment well General Behavior During Session: [facility] for tasks performed Cognition: [facility] for tasks performed  GP Functional Assessment  Tool Used: Bertis Ruddy, PTA 10/11/2012, 12:23 PM

## 2012-10-13 ENCOUNTER — Ambulatory Visit (HOSPITAL_COMMUNITY): Payer: Medicare Other | Admitting: *Deleted

## 2012-10-28 ENCOUNTER — Ambulatory Visit (HOSPITAL_COMMUNITY)
Admission: RE | Admit: 2012-10-28 | Discharge: 2012-10-28 | Disposition: A | Discharge status: Home / Self Care | Payer: Medicare Other | Source: Ambulatory Visit | Attending: Internal Medicine | Admitting: Internal Medicine

## 2012-10-28 ENCOUNTER — Other Ambulatory Visit (HOSPITAL_COMMUNITY): Payer: Self-pay | Admitting: Internal Medicine

## 2012-10-28 DIAGNOSIS — R509 Fever, unspecified: Secondary | ICD-10-CM

## 2012-10-28 DIAGNOSIS — R059 Cough, unspecified: Secondary | ICD-10-CM | POA: Insufficient documentation

## 2012-10-28 DIAGNOSIS — R918 Other nonspecific abnormal finding of lung field: Secondary | ICD-10-CM | POA: Insufficient documentation

## 2012-10-28 DIAGNOSIS — R05 Cough: Secondary | ICD-10-CM

## 2013-02-09 ENCOUNTER — Other Ambulatory Visit: Payer: Self-pay | Admitting: Internal Medicine

## 2013-02-09 DIAGNOSIS — R413 Other amnesia: Secondary | ICD-10-CM

## 2013-02-14 ENCOUNTER — Ambulatory Visit
Admission: RE | Admit: 2013-02-14 | Discharge: 2013-02-14 | Disposition: A | Discharge status: Home / Self Care | Payer: Medicare Other | Source: Ambulatory Visit | Attending: Internal Medicine | Admitting: Internal Medicine

## 2013-02-14 DIAGNOSIS — R413 Other amnesia: Secondary | ICD-10-CM

## 2013-07-11 ENCOUNTER — Ambulatory Visit (HOSPITAL_COMMUNITY)
Admission: RE | Admit: 2013-07-11 | Discharge: 2013-07-11 | Disposition: A | Discharge status: Home / Self Care | Payer: Medicare Other | Source: Ambulatory Visit | Attending: Neurosurgery | Admitting: Neurosurgery

## 2013-07-11 DIAGNOSIS — IMO0001 Reserved for inherently not codable concepts without codable children: Secondary | ICD-10-CM | POA: Insufficient documentation

## 2013-07-11 DIAGNOSIS — M47817 Spondylosis without myelopathy or radiculopathy, lumbosacral region: Secondary | ICD-10-CM | POA: Insufficient documentation

## 2013-07-11 DIAGNOSIS — R262 Difficulty in walking, not elsewhere classified: Secondary | ICD-10-CM | POA: Insufficient documentation

## 2013-07-11 DIAGNOSIS — M545 Low back pain, unspecified: Secondary | ICD-10-CM | POA: Insufficient documentation

## 2013-07-11 DIAGNOSIS — Z981 Arthrodesis status: Secondary | ICD-10-CM | POA: Insufficient documentation

## 2013-07-11 DIAGNOSIS — R29898 Other symptoms and signs involving the musculoskeletal system: Secondary | ICD-10-CM | POA: Insufficient documentation

## 2013-07-11 NOTE — Evaluation (Signed)
Physical Therapy Evaluation  Patient Details  Name: Michael [name] MRN: [id number] Date of Birth: 11-Aug-1941  Today's Date: 07/11/2013 Time: 0945-1025 PT Time Calculation (min): 40 min Charges: 1 evaluation              Visit#: 1 of 10  Re-eval: 08/10/13 Assessment Diagnosis: Lumbar Fusion - Mas TLIF, XLIF  Surgical Date: 04/25/13 Next MD Visit: Dr. Channing Mutters - unscheduled  Authorization: [facility] Medicare    Authorization Time Period:    Authorization Visit#: 1 of 10   Past Medical History:  Past Medical History  Diagnosis Date  . Hypertension   . Coronary artery disease    Past Surgical History:  Past Surgical History  Procedure Laterality Date  . Colonoscopy  07/31/2011    Procedure: COLONOSCOPY;  Surgeon: Dalia Heading;  Location: [facility];  Service: Gastroenterology;  Laterality: N/A;    Subjective Symptoms/Limitations Symptoms: Pt is a 72 year old male referred to PT s/p lumbar fusion on 04/25/13.  He reports that he was living in pain for the first 2 weeks and was living on pain medication.  Now he is feeling much better and is using a less addictive pain medicaition every 6 hours.  He was using a RW to get around the house.  Has not used his RW for 4-6 weeks, only uses his RW when he is outdoors and needs help to get up.  He still likes to wear his brace when going for long walks with his dogs in case he steps in a hole.  He has a hx of nerve pain to his left leg, he recieved multiple injections to his back and had therapy to his back before surgery.  He decided to have lumbar fusion to care for his pain.  At this time pt is also limited by his RUE movement due to a torn bicep.  Patient Stated Goals: He wants to go back to the [employer], TM, ellipitcal, wants to learn what to avoid.  Pain Assessment Currently in Pain?: Yes Pain Score: 5  (on average and w/pain med.  w/o pain med: 8/10) Pain Location: Back Pain Orientation: Posterior Pain Type: Acute pain;Chronic pain;Surgical  pain Pain Onset: More than a month ago Pain Frequency: Constant Pain Relieving Factors: pain medication  Effect of Pain on Daily Activities: difficulty getting from up and down  Precautions/Restrictions  Precautions Precautions: Back  Balance Screening    Prior Functioning  Prior Function Vocation: Retired Gaffer: Production designer, theatre/television/film Comments: Enjoys raising birds and animals   Cognition/Observation Observation/Other Assessments Observations: guarded/strained movement with LLE compared to ease with RLE  Sensation/Coordination/Flexibility/Functional Tests Functional Tests Functional Tests: FOTO: Status: 40%, Limiation: 60%  Assessment RLE Strength Right Hip Flexion: 4/5 Right Hip Extension: 4/5 Right Hip ABduction: 4/5 Right Hip ADduction: 4/5 LLE Strength Left Hip Flexion: 4/5 Left Hip Extension: 3+/5 Left Hip ABduction: 4/5 Left Hip ADduction: 4/5 Lumbar AROM Lumbar Flexion: decreased 5-% Lumbar Extension: decreased b50% Lumbar - Right Side Bend: decreased 25% Lumbar - Left Side Bend: decreased 25% Palpation Palpation: significant pain and tenderness to Rt quadratus region with moderate fascial restrictions to 7 inscision sights to lumbar spine.   Mobility/Balance  Ambulation/Gait Ambulation/Gait: Yes Gait Pattern: Antalgic (toe out) Posture/Postural Control Posture/Postural Control: Postural limitations Postural Limitations: Swayback   Physical Therapy Assessment and Plan PT Assessment and Plan Clinical Impression Statement: Pt is a 72 year old male referred to PT for core strengthening and LE flexibility s/p lumbar fusion with impairments listed below.  After evaluation pt is found that pt has significant muscle spasms to Rt lumbar and gluteal region, demonstrates less smooth and coordinated movements with LLE and has SI alignment dysfunction which may be causing some of his moderate pain and discomfort.   Pt will benefit from skilled therapeutic  intervention in order to improve on the following deficits: Abnormal gait;Decreased activity tolerance;Pain;Decreased coordination;Decreased strength;Impaired perceived functional ability;Improper spinal/pelvic alignment;Improper body mechanics;Increased muscle spasms;Decreased range of motion;Decreased mobility;Increased fascial restricitons Rehab Potential: Good PT Frequency: Min 3X/week (3x/week x2 weeks; 2x/week x4 weeks) PT Duration: 6 weeks PT Treatment/Interventions: Gait training;Functional mobility training;Therapeutic activities;Therapeutic exercise;Balance training;Neuromuscular re-education;Patient/family education;Manual techniques PT Plan: BACK PERCAUTIONS, MET for SI as needed, core strengthening, manual techniques to lumbar and gluteal region as needed.  Continue to improve postural strength and funciton.     Goals Home Exercise Program Pt/caregiver will Perform Home Exercise Program: Independently PT Goal: Perform Home Exercise Program - Progress: Goal set today PT Short Term Goals Time to Complete Short Term Goals: 3 weeks PT Short Term Goal 1: Pt will present with decreased fascial restrictions and normal SI alignment to decrease need for pain medication with pain no greater than 5/10. PT Short Term Goal 2: Pt will improve his lumbar and BLE flexibility to improve lumbar AROM to WNL.  PT Short Term Goal 3: Pt will improve his postural strength and activity tolerance in order to ambulate x15 minutes without the need of his back brace to walk his dog.  PT Short Term Goal 4: Pt will improve his LE functional strength to WNL and be educated on proper body mechanics in order to safely return to the gym.  PT Long Term Goals Time to Complete Long Term Goals:  (6 weeks) PT Long Term Goal 1: Pt will report his FOTO score: Status > 54% and Limitation < 46% for improved percieved functional ability.  PT Long Term Goal 2: Pt will improve his postural strength in order to lift 3lbs above  his head into a cabinet with his LUE in order to put dishes away.   Long Term Goal 3: Pt will improve his BLE and core flexibiliy to WNL in order to go to half kneel without difficulty.  Long Term Goal 4: Pt will improve his BLE strength to WNL in order to go from half kneel to stand without UE assist in order to get up from the ground when feeding and caring for his birds.   Problem List Patient Active Problem List   Diagnosis Date Noted  . Lumbosacral spondylosis without myelopathy 07/11/2013  . S/P lumbar fusion 07/11/2013  . Back pain, chronic 09/19/2012  . Leg weakness, bilateral 09/19/2012  . Difficulty in walking 09/19/2012  . Tendonitis of shoulder, right 10/26/2011  . Vertigo 10/26/2011  . Pain in joint, shoulder region 10/26/2011  . Shoulder weakness 10/26/2011    PT - End of Session Activity Tolerance: Patient tolerated treatment well General Behavior During Therapy: WFL for tasks assessed/performed PT Plan of Care PT Home Exercise Plan: given PT Patient Instructions: importance of HEP, SI alignment Consulted and Agree with Plan of Care: Patient  GP Functional Assessment Tool Used: FOTO: Status: 40%, Impairment 60% Functional Limitation: Other PT primary Other PT Primary Current Status (Z6109): At least 60 percent but less than 80 percent impaired, limited or restricted Other PT Primary Goal Status (U0454): At least 40 percent but less than 60 percent impaired, limited or restricted  Jamillia Closson, MPT, ATC 07/11/2013, 11:08 AM  Physician Documentation Your  signature is required to indicate approval of the treatment plan as stated above.  Please sign and either send electronically or make a copy of this report for your files and return this physician signed original.   Please mark one 1.__approve of plan  2. ___approve of plan with the following conditions.   ______________________________                                                           _____________________ Physician Signature                                                                                                             Date

## 2013-07-17 ENCOUNTER — Ambulatory Visit (HOSPITAL_COMMUNITY)
Admission: RE | Admit: 2013-07-17 | Discharge: 2013-07-17 | Disposition: A | Discharge status: Home / Self Care | Payer: Medicare Other | Source: Ambulatory Visit | Attending: Neurosurgery | Admitting: Neurosurgery

## 2013-07-17 DIAGNOSIS — M545 Low back pain, unspecified: Secondary | ICD-10-CM | POA: Insufficient documentation

## 2013-07-17 DIAGNOSIS — R29898 Other symptoms and signs involving the musculoskeletal system: Secondary | ICD-10-CM | POA: Insufficient documentation

## 2013-07-17 DIAGNOSIS — IMO0001 Reserved for inherently not codable concepts without codable children: Secondary | ICD-10-CM | POA: Insufficient documentation

## 2013-07-17 DIAGNOSIS — R262 Difficulty in walking, not elsewhere classified: Secondary | ICD-10-CM | POA: Insufficient documentation

## 2013-07-17 NOTE — Progress Notes (Signed)
Physical Therapy Treatment Patient Details  Name: Michael [name] MRN: [id number] Date of Birth: 05-May-1941  Today's Date: 07/17/2013 Time: 4098-1191 PT Time Calculation (min): 48 min Charges: Manual x 23'(1600-1623) Therex x 47'([phone])  Visit#: 2 of 10  Re-eval: 08/10/13  Authorization: [employer] Medicare  Authorization Visit#: 2 of 10   Subjective: Symptoms/Limitations Symptoms: Pt reprots HEP compliance. Pain Assessment Currently in Pain?: Yes Pain Score: 4  Pain Location: Back Pain Orientation: Left;Mid   Exercise/Treatments Aerobic Tread Mill: 1.3 at L3 incline after MET Supine Ab Set: 10 reps Bridge: 10 reps Straight Leg Raise: 10 reps  Manual Therapy Manual Therapy: Myofascial release Myofascial Release: Gentle MFR to lumbar to decrease adhesions Other Manual Therapy: MET to correct left out flare  Physical Therapy Assessment and Plan PT Assessment and Plan Clinical Impression Statement: MET completed to correct left outflare. Pt displays what initially appeared to be leg length discrepancy. Discrepancy decreased after MET. Pt completes therex well after initial cueing and demo. Pt displays good core control with supine stabilization exercises. Pt reports pain decrease to 2-3/10 at end of session.  Pt will benefit from skilled therapeutic intervention in order to improve on the following deficits: Abnormal gait;Decreased activity tolerance;Pain;Decreased coordination;Decreased strength;Impaired perceived functional ability;Improper spinal/pelvic alignment;Improper body mechanics;Increased muscle spasms;Decreased range of motion;Decreased mobility;Increased fascial restricitons Rehab Potential: Good PT Frequency: Min 3X/week (3x/week x2 weeks; 2x/week x4 weeks) PT Duration: 6 weeks PT Treatment/Interventions: Gait training;Functional mobility training;Therapeutic activities;Therapeutic exercise;Balance training;Neuromuscular re-education;Patient/family education;Manual  techniques PT Plan: BACK PERCAUTIONS, MET for SI as needed, core strengthening, manual techniques to lumbar and gluteal region as needed.  Continue to improve postural strength and funciton.     Goals    Problem List Patient Active Problem List   Diagnosis Date Noted  . Lumbosacral spondylosis without myelopathy 07/11/2013  . S/P lumbar fusion 07/11/2013  . Back pain, chronic 09/19/2012  . Leg weakness, bilateral 09/19/2012  . Difficulty in walking 09/19/2012  . Tendonitis of shoulder, right 10/26/2011  . Vertigo 10/26/2011  . Pain in joint, shoulder region 10/26/2011  . Shoulder weakness 10/26/2011    PT - End of Session Activity Tolerance: Patient tolerated treatment well General Behavior During Therapy: [facility] for tasks assessed/performed  Seth Bake, PTA  07/17/2013, 5:22 PM

## 2013-07-19 ENCOUNTER — Ambulatory Visit (HOSPITAL_COMMUNITY)
Admission: RE | Admit: 2013-07-19 | Discharge: 2013-07-19 | Disposition: A | Discharge status: Home / Self Care | Payer: Medicare Other | Source: Ambulatory Visit | Attending: Internal Medicine | Admitting: Internal Medicine

## 2013-07-19 DIAGNOSIS — Z981 Arthrodesis status: Secondary | ICD-10-CM

## 2013-07-19 DIAGNOSIS — M47817 Spondylosis without myelopathy or radiculopathy, lumbosacral region: Secondary | ICD-10-CM

## 2013-07-19 NOTE — Progress Notes (Signed)
Physical Therapy Treatment Patient Details  Name: Michael [name] MRN: [id number] Date of Birth: 16-Mar-1941  Today's Date: 07/19/2013 Time: 0934-1020 PT Time Calculation (min): 46 min Charges: TE: 59'  Visit#: 3 of 10  Re-eval: 08/10/13    Authorization: [employer] Medicare  Authorization Time Period:    Authorization Visit#: 3 of 10   Subjective: Symptoms/Limitations Symptoms: Pt reports that he was feeling better after the treatment last visit.  Pain Assessment Currently in Pain?: Yes Pain Score: 2  Pain Orientation: Left;Mid  Precautions/Restrictions     Exercise/Treatments Stretches Passive Hamstring Stretch: 1 rep;60 seconds;Limitations Passive Hamstring Stretch Limitations: BLE ITB Stretch: 1 rep;60 seconds Aerobic Elliptical: 10 minutes 1.0 at end of session Supine Ab Set: 5 reps;Limitations AB Set Limitations: 10 sec holds Clam: 10 reps;Limitations Clam Limitations: w/ab set alternating legs Heel Slides: 5 reps;Limitations Heel Slides Limitations: w/ab set alternating legs Bent Knee Raise: 10 reps;Limitations Bent Knee Raise Limitations: w/ab set alternating legs Dead Bug: 10 reps;Limitations Dead Bug Limitations: w/ab set alternating Bridge: 10 reps;5 seconds;Limitations Bridge Limitations: w/knee adduction  Straight Leg Raise: 10 reps;Limitations Straight Leg Raises Limitations: BLE floating  Sidelying Hip Abduction: 10 reps;Limitations Hip Abduction Limitations: hip circles clockwise and counterclockwise x10 Other Sidelying Lumbar Exercises: hip abducuction x10 reps BLE Prone  Opposite Arm/Leg Raise: Right arm/Left leg;Left arm/Right leg;10 reps  Manual Therapy Manual Therapy: Myofascial release Other Manual Therapy: No MET needed today  Physical Therapy Assessment and Plan PT Assessment and Plan Clinical Impression Statement: added mat exercises to improve core coordination and strength.  Pt has improved coordination to TrA and multifidus musculature  by end of treatment.  PT Frequency:  (3x/week x2 weeks; 2x/week x4 weeks) PT Plan: Back Percautions.  MET as needed for SI.  Added standing side lunges, forward lunges, minin squats, heel and toe raises, theraband exercises. Review ITB and hamstring stretching.   Progress towards wall slides, SLS and vector stance.     Goals Home Exercise Program Pt/caregiver will Perform Home Exercise Program: Independently PT Goal: Perform Home Exercise Program - Progress: Met PT Short Term Goals Time to Complete Short Term Goals: 3 weeks PT Short Term Goal 1: Pt will present with decreased fascial restrictions and normal SI alignment to decrease need for pain medication with pain no greater than 5/10. PT Short Term Goal 1 - Progress: Progressing toward goal PT Short Term Goal 2: Pt will improve his lumbar and BLE flexibility to improve lumbar AROM to WNL.  PT Short Term Goal 2 - Progress: Progressing toward goal PT Short Term Goal 3: Pt will improve his postural strength and activity tolerance in order to ambulate x15 minutes without the need of his back brace to walk his dog.  PT Short Term Goal 3 - Progress: Progressing toward goal PT Short Term Goal 4: Pt will improve his LE functional strength to WNL and be educated on proper body mechanics in order to safely return to the gym.  PT Short Term Goal 4 - Progress: Progressing toward goal PT Long Term Goals Time to Complete Long Term Goals:  (6 weeks) PT Long Term Goal 1: Pt will report his FOTO score: Status > 54% and Limitation < 46% for improved percieved functional ability.  PT Long Term Goal 1 - Progress: Progressing toward goal PT Long Term Goal 2: Pt will improve his postural strength in order to lift 3lbs above his head into a cabinet with his LUE in order to put dishes away.   PT Long Term  Goal 2 - Progress: Progressing toward goal Long Term Goal 3: Pt will improve his BLE and core flexibiliy to WNL in order to go to half kneel without difficulty.   Long Term Goal 3 Progress: Progressing toward goal Long Term Goal 4: Pt will improve his BLE strength to WNL in order to go from half kneel to stand without UE assist in order to get up from the ground when feeding and caring for his birds.  Long Term Goal 4 Progress: Progressing toward goal  Problem List Patient Active Problem List   Diagnosis Date Noted  . Lumbosacral spondylosis without myelopathy 07/11/2013  . S/P lumbar fusion 07/11/2013  . Back pain, chronic 09/19/2012  . Leg weakness, bilateral 09/19/2012  . Difficulty in walking 09/19/2012  . Tendonitis of shoulder, right 10/26/2011  . Vertigo 10/26/2011  . Pain in joint, shoulder region 10/26/2011  . Shoulder weakness 10/26/2011    PT - End of Session Activity Tolerance: Patient tolerated treatment well General Behavior During Therapy: [facility] for tasks assessed/performed PT Plan of Care PT Home Exercise Plan: updated with dead bug and hamstring stretch Consulted and Agree with Plan of Care: Patient  GP    Phares Zaccone, MPT, ATC 07/19/2013, 10:27 AM

## 2013-07-20 ENCOUNTER — Telehealth (HOSPITAL_COMMUNITY): Payer: Self-pay

## 2013-07-21 ENCOUNTER — Ambulatory Visit (HOSPITAL_COMMUNITY): Payer: Medicare Other | Admitting: Physical Therapy

## 2013-07-24 ENCOUNTER — Ambulatory Visit (HOSPITAL_COMMUNITY)
Admission: RE | Admit: 2013-07-24 | Discharge: 2013-07-24 | Disposition: A | Discharge status: Home / Self Care | Payer: Medicare Other | Source: Ambulatory Visit | Attending: Internal Medicine | Admitting: Internal Medicine

## 2013-07-24 NOTE — Progress Notes (Signed)
Physical Therapy Treatment Patient Details  Name: LARSEN DUNGAN MRN: [id number] Date of Birth: 1941-08-04  Today's Date: 07/24/2013 Time: 0937-1020 PT Time Calculation (min): 43 min  Visit#: 4 of 10  Re-eval: 08/10/13 Authorization: [employer] Medicare  Authorization Visit#: 4 of 10  Charges:  therex 40'  Subjective: Symptoms/Limitations Symptoms: Pt states he is doing much better overall, just general soreness. Pain Assessment Currently in Pain?: No/denies   Exercise/Treatments Stretches ITB Stretch: 1 rep;60 seconds Aerobic Elliptical: 10 minutes 1.0 at end of session Standing Heel Raises: 10 reps;Limitations Heel Raises Limitations: toeraises 10 reps Functional Squats: 10 reps Forward Lunge: 10 reps Side Lunge: 10 reps Scapular Retraction: 10 reps;Theraband Theraband Level (Scapular Retraction): Level 3 (Green) Row: 10 reps;Theraband Theraband Level (Row): Level 3 (Green) Shoulder Extension: 10 reps;Theraband Theraband Level (Shoulder Extension): Level 4 (Blue) Sidelying Hip Abduction: 10 reps;Limitations Hip Abduction Limitations: hip circles clockwise and counterclockwise x10 Prone  Opposite Arm/Leg Raise: 10 reps    Physical Therapy Assessment and Plan PT Assessment and Plan Clinical Impression Statement: Began standing stabilization/strengthening exercises.  Pt required VC's to remain in good form and isolate specific mm groups.  Pt eager to return to [facility]; instructed in exercises /machines that would be safe and demonstrated correct way to perform LE strengthening on machines (slow, controlled).  Pt given info on therapist being at [facility] every 4th Tuesday and would be good to shadow/discuss treatment plan. PT Frequency:  (3x/week x2 weeks; 2x/week x4 weeks) PT Plan: Back Percautions. Review ITB and hamstring stretching.   Progress towards wall slides, SLS and vector stance. strengthening of Rt>Lt LE.     Problem List Patient Active Problem List   Diagnosis Date  Noted  . Lumbosacral spondylosis without myelopathy 07/11/2013  . S/P lumbar fusion 07/11/2013  . Back pain, chronic 09/19/2012  . Leg weakness, bilateral 09/19/2012  . Difficulty in walking 09/19/2012  . Tendonitis of shoulder, right 10/26/2011  . Vertigo 10/26/2011  . Pain in joint, shoulder region 10/26/2011  . Shoulder weakness 10/26/2011    PT - End of Session Activity Tolerance: Patient tolerated treatment well General Behavior During Therapy: [facility] for tasks assessed/performed PT Plan of Care Consulted and Agree with Plan of Care: Patient   Lurena Nida, PTA/CLT 07/24/2013, 10:23 AM

## 2013-07-26 ENCOUNTER — Ambulatory Visit (HOSPITAL_COMMUNITY)
Admission: RE | Admit: 2013-07-26 | Discharge: 2013-07-26 | Disposition: A | Discharge status: Home / Self Care | Payer: Medicare Other | Source: Ambulatory Visit | Attending: Internal Medicine | Admitting: Internal Medicine

## 2013-07-26 NOTE — Progress Notes (Signed)
Physical Therapy Treatment Patient Details  Name: Michael [name] MRN: [id number] Date of Birth: [birth year]  Today's Date: 07/26/2013 Time: 0935-1015 PT Time Calculation (min): 40 min  Visit#: 5 of 10  Re-eval: 08/10/13 Authorization: [employer] Medicare  Authorization Visit#: 5 of 10  Charges:  therex [phone] (29'), manual 1005-1015 (10')  Subjective: Symptoms/Limitations Symptoms: Pt states he has one area of soreness today in his mid lumbar region.  States he thinks its from the exercises; not really pain. Pain Assessment Currently in Pain?: Yes Pain Score: 2  Pain Location: Back  Precautions/Restrictions     Exercise/Treatments Aerobic Elliptical: 10 minutes 1.0 at end of session Standing Wall Slides: 10 reps;5 seconds Sidelying Hip Abduction: 10 reps;Limitations Hip Abduction Limitations: hip circles clockwise and counterclockwise x10 Other Sidelying Lumbar Exercises: hip abducuction x15 reps BLE Prone  Single Arm Raise: 10 reps Straight Leg Raise: 10 reps Opposite Arm/Leg Raise: 10 reps Other Prone Lumbar Exercises: heelsqueeze 10X5"    Manual Therapy Manual Therapy: Myofascial release Myofascial Release: MFR to Lt SI/hip and lumbar regions to decrease adhesions and pain.  No MET needed today as SI in alignment.  Physical Therapy Assessment and Plan PT Assessment and Plan Clinical Impression Statement: Progressed to wallslides and SLS today.  Pt unable to balance greater than 7" on Rt due to increased discomfort in Lt SI region.  Cues needed with wallslides to evenly distribute weight through LE's and increase hold time.  Worked on Lumbar region in prone position today, Lt>Rt to decrease discomfort.  Pt reported decreased discomfort with noted improvement in gait at end of session. PT Frequency:  (3x/week x2 weeks; 2x/week x4 weeks) PT Plan: Back Precautions. Review ITB and hamstring stretching.   Progress towards vector stance. strengthening of Rt>Lt LE.       Problem List Patient Active Problem List   Diagnosis Date Noted  . Lumbosacral spondylosis without myelopathy 07/11/2013  . S/P lumbar fusion 07/11/2013  . Back pain, chronic 09/19/2012  . Leg weakness, bilateral 09/19/2012  . Difficulty in walking 09/19/2012  . Tendonitis of shoulder, right 10/26/2011  . Vertigo 10/26/2011  . Pain in joint, shoulder region 10/26/2011  . Shoulder weakness 10/26/2011    PT - End of Session Activity Tolerance: Patient tolerated treatment well General Behavior During Therapy: [facility] for tasks assessed/performed PT Plan of Care Consulted and Agree with Plan of Care: Patient   Lurena Nida, PTA/CLT 07/26/2013, 10:46 AM

## 2013-07-28 ENCOUNTER — Ambulatory Visit (HOSPITAL_COMMUNITY)
Admission: RE | Admit: 2013-07-28 | Discharge: 2013-07-28 | Disposition: A | Discharge status: Home / Self Care | Payer: Medicare Other | Source: Ambulatory Visit | Attending: Internal Medicine | Admitting: Internal Medicine

## 2013-07-28 DIAGNOSIS — M47817 Spondylosis without myelopathy or radiculopathy, lumbosacral region: Secondary | ICD-10-CM

## 2013-07-28 DIAGNOSIS — Z981 Arthrodesis status: Secondary | ICD-10-CM

## 2013-07-28 NOTE — Progress Notes (Signed)
Physical Therapy Treatment Patient Details  Name: Michael [name] MRN: [id number] Date of Birth: 09/14/1941  Today's Date: 07/28/2013 Time: 4098-1191 PT Time Calculation (min): 25 min Charges Manual: 25' Visit#: 6 of 10  Re-eval: 08/10/13    Authorization: [facility] Medicare  Authorization Time Period:    Authorization Visit#: 6 of 10   Subjective: Symptoms/Limitations Symptoms: Pt reports that he is doing his exercises at home and continues to go to [employer].  He is feeling better overall, today has some pain to his lumbar spine.  Is only able to stay for 25 minutes due to family constraints Pain Assessment Currently in Pain?: Yes Pain Score: 3  Pain Location: Back Pain Orientation: Right;Mid  Precautions/Restrictions     Exercise/Treatments Manual Therapy Myofascial Release: MFR to BLE SI/hip and lumbar regions to decrease adhesions and pain with soft tissue massage after with strain counter strain to decrease fascial restrictions and improve mobility.   Physical Therapy Assessment and Plan PT Assessment and Plan Clinical Impression Statement: Pt has 80 decrease in fascial restrictions, continues to have to Rt quadratus lumborum, piriformis and to his Rt lower ribs.  Utlizied SCS during soft tissue massage to decrease fascial restrictions and muscle spasms.  PT Frequency:  (3x/week x2 weeks; 2x/week x4 weeks) PT Plan: Back Precautions. Review ITB and hamstring stretching.   Progress towards vector stance. strengthening of Rt>Lt LE.    Goals Home Exercise Program Pt/caregiver will Perform Home Exercise Program: Independently PT Goal: Perform Home Exercise Program - Progress: Met PT Short Term Goals Time to Complete Short Term Goals: 3 weeks PT Short Term Goal 1: Pt will present with decreased fascial restrictions and normal SI alignment to decrease need for pain medication with pain no greater than 5/10. PT Short Term Goal 1 - Progress: Met PT Short Term Goal 2: Pt will  improve his lumbar and BLE flexibility to improve lumbar AROM to WNL.  PT Short Term Goal 2 - Progress: Progressing toward goal PT Short Term Goal 3: Pt will improve his postural strength and activity tolerance in order to ambulate x15 minutes without the need of his back brace to walk his dog.  PT Short Term Goal 3 - Progress: Met PT Short Term Goal 4: Pt will improve his LE functional strength to WNL and be educated on proper body mechanics in order to safely return to the gym.  PT Short Term Goal 4 - Progress: Progressing toward goal PT Long Term Goals Time to Complete Long Term Goals:  (6 weeks) PT Long Term Goal 1: Pt will report his FOTO score: Status > 54% and Limitation < 46% for improved percieved functional ability.  PT Long Term Goal 2: Pt will improve his postural strength in order to lift 3lbs above his head into a cabinet with his LUE in order to put dishes away.   Long Term Goal 3: Pt will improve his BLE and core flexibiliy to WNL in order to go to half kneel without difficulty.  Long Term Goal 4: Pt will improve his BLE strength to WNL in order to go from half kneel to stand without UE assist in order to get up from the ground when feeding and caring for his birds.   Problem List Patient Active Problem List   Diagnosis Date Noted  . Lumbosacral spondylosis without myelopathy 07/11/2013  . S/P lumbar fusion 07/11/2013  . Back pain, chronic 09/19/2012  . Leg weakness, bilateral 09/19/2012  . Difficulty in walking 09/19/2012  . Tendonitis  of shoulder, right 10/26/2011  . Vertigo 10/26/2011  . Pain in joint, shoulder region 10/26/2011  . Shoulder weakness 10/26/2011    PT - End of Session Activity Tolerance: Patient tolerated treatment well General Behavior During Therapy: [facility] for tasks assessed/performed PT Plan of Care Consulted and Agree with Plan of Care: Patient  GP    Khi Mcmillen, MPT, ATC 07/28/2013, 10:03 AM

## 2013-08-01 ENCOUNTER — Ambulatory Visit (HOSPITAL_COMMUNITY)
Admission: RE | Admit: 2013-08-01 | Discharge: 2013-08-01 | Disposition: A | Discharge status: Home / Self Care | Payer: Medicare Other | Source: Ambulatory Visit | Attending: Internal Medicine | Admitting: Internal Medicine

## 2013-08-01 NOTE — Progress Notes (Signed)
Physical Therapy Treatment Patient Details  Name: Michael [name] MRN: [id number] Date of Birth: 13-Mar-1941  Today's Date: 08/01/2013 Time: 0932-1016 PT Time Calculation (min): 44 min Charges: Therex x [phone]) Manual x 25' (0950-1015)  Visit#: 7 of 10  Re-eval: 08/10/13  Authorization: [employer] Medicare  Authorization Visit#: 7 of 10   Subjective: Symptoms/Limitations Symptoms: Pt states that he felt much better after last session. Pain Assessment Currently in Pain?: Yes Pain Score: 4  Pain Location: Back Pain Orientation: Right;Left;Lower  Exercise/Treatments Stretches Passive Hamstring Stretch: 1 rep;60 seconds;Limitations Passive Hamstring Stretch Limitations: BLE ITB Stretch: 2 reps;30 seconds;Limitations ITB Stretch Limitations: Bilateral Piriformis Stretch: 1 rep;60 seconds;Limitations Piriformis Stretch Limitations: Bilateral  Manual Therapy Myofascial Release: MFR to SI and rigth gluteal region to decrease tightness and pain; Manual streching of bialteral hips in prone  Physical Therapy Assessment and Plan PT Assessment and Plan Clinical Impression Statement: Sacroiliac alignment noted. Pt continues to displays tightness in bilateral hips. Passive and active stretching completed to bilateral hip rotators to decrease tightness and pain. MFR completed to SI and left gluteal region to decrease pain and tightness. PT Frequency:  (3x/week x2 weeks; 2x/week x4 weeks) PT Plan: Back Precautions. Review ITB and hamstring stretching.   Progress towards vector stance. strengthening of Rt>Lt LE. If left posterior hip soreness persists assess possible benefits of ultrasound.     Problem List Patient Active Problem List   Diagnosis Date Noted  . Lumbosacral spondylosis without myelopathy 07/11/2013  . S/P lumbar fusion 07/11/2013  . Back pain, chronic 09/19/2012  . Leg weakness, bilateral 09/19/2012  . Difficulty in walking 09/19/2012  . Tendonitis of shoulder, right  10/26/2011  . Vertigo 10/26/2011  . Pain in joint, shoulder region 10/26/2011  . Shoulder weakness 10/26/2011    PT - End of Session Activity Tolerance: Patient tolerated treatment well General Behavior During Therapy: [facility] for tasks assessed/performed  Seth Bake, PTA  08/01/2013, 11:34 AM

## 2013-08-03 ENCOUNTER — Ambulatory Visit (HOSPITAL_COMMUNITY)
Admission: RE | Admit: 2013-08-03 | Discharge: 2013-08-03 | Disposition: A | Discharge status: Home / Self Care | Payer: Medicare Other | Source: Ambulatory Visit | Attending: Internal Medicine | Admitting: Internal Medicine

## 2013-08-03 NOTE — Progress Notes (Signed)
Physical Therapy Treatment Patient Details  Name: Michael [name] MRN: [id number] Date of Birth: [birth year]  Today's Date: 08/03/2013 Time: 4098-1191 PT Time Calculation (min): 42 min Charges: [place] x 47'(8295-6213) Manual x 20'(0953-1013)  Visit#: 8 of 10  Re-eval: 08/10/13  Authorization: [employer] Medicare  Authorization Visit#: 8 of 10   Subjective: Symptoms/Limitations Symptoms: Pt states that he is having more stiffness than pain. Pain Assessment Currently in Pain?: Yes Pain Score: 3  Pain Location: Back Pain Orientation: Right;Left;Lower   Exercise/Treatments Stretches ITB Stretch: 2 reps;30 seconds;Limitations ITB Stretch Limitations: Bilateral Piriformis Stretch: 1 rep;60 seconds;Limitations Piriformis Stretch Limitations: Bilateral  Modalities Modalities: Ultrasound Ultrasound Ultrasound Location: Bilateral SI/gluteal area Ultrasound Parameters: 1.5 w/cm2 8' bialteral (16') Ultrasound Goals: Pain  Physical Therapy Assessment and Plan PT Assessment and Plan Clinical Impression Statement: Completed ultrasound to bilateral SI/gluteal areas to decrease pain and tightness. Stretching to bilateral hip musculature completed after ultrasound. Sacroiliac alignment noted. No MET needed this session. Pt reports decreased pain at end of session. PT Frequency:  (3x/week x2 weeks; 2x/week x4 weeks) PT Plan: Back Precautions. Review ITB and hamstring stretching.   Progress towards vector stance. strengthening of Rt>Lt LE. If left posterior hip soreness persists assess possible benefits of ultrasound.     Problem List Patient Active Problem List   Diagnosis Date Noted  . Lumbosacral spondylosis without myelopathy 07/11/2013  . S/P lumbar fusion 07/11/2013  . Back pain, chronic 09/19/2012  . Leg weakness, bilateral 09/19/2012  . Difficulty in walking 09/19/2012  . Tendonitis of shoulder, right 10/26/2011  . Vertigo 10/26/2011  . Pain in joint, shoulder region 10/26/2011   . Shoulder weakness 10/26/2011    PT - End of Session Activity Tolerance: Patient tolerated treatment well General Behavior During Therapy: [facility] for tasks assessed/performed  Seth Bake, PTA  08/03/2013, 10:57 AM

## 2013-08-08 ENCOUNTER — Ambulatory Visit (HOSPITAL_COMMUNITY)
Admission: RE | Admit: 2013-08-08 | Discharge: 2013-08-08 | Disposition: A | Discharge status: Home / Self Care | Payer: Medicare Other | Source: Ambulatory Visit | Attending: Internal Medicine | Admitting: Internal Medicine

## 2013-08-08 DIAGNOSIS — Z981 Arthrodesis status: Secondary | ICD-10-CM

## 2013-08-08 DIAGNOSIS — M47817 Spondylosis without myelopathy or radiculopathy, lumbosacral region: Secondary | ICD-10-CM

## 2013-08-08 NOTE — Progress Notes (Signed)
Physical Therapy Treatment Patient Details  Name: Michael [name] MRN: [id number] Date of Birth: June 03, 1941  Today's Date: 08/08/2013 Time: 0926-1015 PT Time Calculation (min): 49 min Charge: [place]  [phone], [place] [phone]  Visit#: 9 of 10  Re-eval: 08/10/13 Assessment Diagnosis: Lumbar Fusion - Mas TLIF, XLIF  Surgical Date: 04/25/13 Next MD Visit: Dr. Channing Mutters - unscheduled  Authorization: [facility] Medicare  Authorization Time Period:    Authorization Visit#: 9 of 10   Subjective: Symptoms/Limitations Symptoms: Pt reports minimum pain Bil hip region.  Stated relief with [place], compliant with HEP dailty. Pain Assessment Currently in Pain?: Yes Pain Score: 2  Pain Location: Back Pain Orientation: Right;Left;Lower  Precautions/Restrictions  Precautions Precautions: Back  Exercise/Treatments Stretches Passive Hamstring Stretch: 3 reps;30 seconds;Limitations Passive Hamstring Stretch Limitations: BLE with rope ITB Stretch: 3 reps;30 seconds ITB Stretch Limitations: Bilateral Piriformis Stretch: 1 rep;60 seconds;Limitations Piriformis Stretch Limitations: Bilateral Standing Other Standing Lumbar Exercises: vector stance 3x 5" Bil LE with 1 finger HHA  Modalities Modalities: Ultrasound Ultrasound Ultrasound Location: Bilateral SI/gluteal area Ultrasound Parameters: 1.5 w/cm2 8' bialteral (16' Ultrasound Goals: Pain  Physical Therapy Assessment and Plan PT Assessment and Plan Clinical Impression Statement: Added vector stance to improve hip strengtheing/mobilty and balance with 1 finger AA required to keep balance.  Continued with ultrasound to bilateral SI/gluteal areas to decrease pain and tightness.  Reviewed stretches and pt given HEP worksheet, able to demonstrate approriate form with all stretches today. PT Plan: Re-eval next session.  Back Precautions.    Goals Home Exercise Program Pt/caregiver will Perform Home Exercise Program: Independently PT Short Term  Goals Time to Complete Short Term Goals: 3 weeks PT Short Term Goal 1: Pt will present with decreased fascial restrictions and normal SI alignment to decrease need for pain medication with pain no greater than 5/10. PT Short Term Goal 2: Pt will improve his lumbar and BLE flexibility to improve lumbar AROM to WNL.  PT Short Term Goal 2 - Progress: Progressing toward goal PT Short Term Goal 3: Pt will improve his postural strength and activity tolerance in order to ambulate x15 minutes without the need of his back brace to walk his dog.  PT Short Term Goal 4: Pt will improve his LE functional strength to WNL and be educated on proper body mechanics in order to safely return to the gym.  PT Long Term Goals Time to Complete Long Term Goals:  (6 weeks) PT Long Term Goal 1: Pt will report his FOTO score: Status > 54% and Limitation < 46% for improved percieved functional ability.  PT Long Term Goal 2: Pt will improve his postural strength in order to lift 3lbs above his head into a cabinet with his LUE in order to put dishes away.   Long Term Goal 3: Pt will improve his BLE and core flexibiliy to WNL in order to go to half kneel without difficulty.  Long Term Goal 3 Progress: Progressing toward goal Long Term Goal 4: Pt will improve his BLE strength to WNL in order to go from half kneel to stand without UE assist in order to get up from the ground when feeding and caring for his birds.   Problem List Patient Active Problem List   Diagnosis Date Noted  . Lumbosacral spondylosis without myelopathy 07/11/2013  . S/P lumbar fusion 07/11/2013  . Back pain, chronic 09/19/2012  . Leg weakness, bilateral 09/19/2012  . Difficulty in walking 09/19/2012  . Tendonitis of shoulder, right 10/26/2011  . Vertigo  10/26/2011  . Pain in joint, shoulder region 10/26/2011  . Shoulder weakness 10/26/2011    PT - End of Session Activity Tolerance: Patient tolerated treatment well General Behavior During Therapy:  [facility] for tasks assessed/performed  GP    Juel Burrow 08/08/2013, 10:42 AM

## 2013-08-10 ENCOUNTER — Ambulatory Visit (HOSPITAL_COMMUNITY)
Admission: RE | Admit: 2013-08-10 | Discharge: 2013-08-10 | Disposition: A | Discharge status: Home / Self Care | Payer: Medicare Other | Source: Ambulatory Visit | Attending: Internal Medicine | Admitting: Internal Medicine

## 2013-08-10 DIAGNOSIS — M47817 Spondylosis without myelopathy or radiculopathy, lumbosacral region: Secondary | ICD-10-CM

## 2013-08-10 DIAGNOSIS — Z981 Arthrodesis status: Secondary | ICD-10-CM

## 2013-08-10 NOTE — Evaluation (Signed)
Physical Therapy Discharge Summary  Patient Details  Name: EDEN RHO MRN: [id number] Date of Birth: [birth year]  Today's Date: 08/10/2013 Time: 0940-1005 PT Time Calculation (min): 25 min Charges: 1 ROM/MMT Self Care: [phone]             Visit#: 10 of 10  Re-eval: 08/10/13 Assessment Diagnosis: Lumbar Fusion - Mas TLIF, XLIF  Surgical Date: 04/25/13 Next MD Visit: Dr. Channing Mutters - unscheduled  Authorization: [facility] Medicare    Authorization Time Period:    Authorization Visit#: 10 of 10   Subjective Symptoms/Limitations Symptoms: Pt reports that he maintains about a 2-3/10 pain to his Lt gluteal region and is ready to return to the gym.   Pain Assessment Currently in Pain?: Yes Pain Score: 2  Pain Location: Back Pain Orientation: Right;Left;Lower Effect of Pain on Daily Activities: no difficulty getting up and down.   Precautions/Restrictions  Precautions Precautions: Back  Sensation/Coordination/Flexibility/Functional Tests Functional Tests Functional Tests: FOTO: Status: 61%, Limitation: 39% ( was Status: 40%, Limiation: 60%)  RLE Strength Right Hip Flexion: 5/5 (was 4/5) Right Hip Extension: 5/5 (was 4/5) Right Hip ABduction: 5/5 (was 4/5) Right Hip ADduction: 5/5 (was 4/5) LLE Strength Left Hip Flexion: 5/5 (was 4/5) Left Hip Extension: 5/5 (was 3+/5) Left Hip ABduction: 5/5 (was 4/5) Left Hip ADduction: 5/5 (was 4/5) Lumbar AROM Lumbar Flexion: decreased 25% (was decreased 50%) Lumbar Extension: WNL (was decreased 50%) Lumbar - Right Side Bend: WNL (was decreased 25%) Lumbar - Left Side Bend: WNL (was decreased 25%) Palpation Palpation: pain and tenderness to Lt SI and gluteal region  Mobility/Balance  Ambulation/Gait Ambulation/Gait: Yes Posture/Postural Control Postural Limitations: Swayback    Physical Therapy Assessment and Plan PT Assessment and Plan Clinical Impression Statement: Mr. Kuiken has attended 10 OP PT visits to address low back pain  after lumbar fusion with the following findings: at this time pt pain avg remains 2-3/10, he has improved he LE strength and core coordinated movements.  He will f/u with MD to discuss weight lifting restrictions.  Pt plans to return to the gym to continue with cardiovascular and light weight lifting for BLE strengthening.  At this time pt is agreeable to D/C from PT with advanced HEP.  PT Plan: D/C    Goals Home Exercise Program Pt/caregiver will Perform Home Exercise Program: Independently PT Goal: Perform Home Exercise Program - Progress: Met PT Short Term Goals Time to Complete Short Term Goals: 3 weeks PT Short Term Goal 1: Pt will present with decreased fascial restrictions and normal SI alignment to decrease need for pain medication with pain no greater than 5/10. PT Short Term Goal 1 - Progress: Met PT Short Term Goal 2: Pt will improve his lumbar and BLE flexibility to improve lumbar AROM to WNL.  PT Short Term Goal 2 - Progress: Progressing toward goal PT Short Term Goal 3: Pt will improve his postural strength and activity tolerance in order to ambulate x15 minutes without the need of his back brace to walk his dog.  PT Short Term Goal 3 - Progress: Met PT Short Term Goal 4: Pt will improve his LE functional strength to WNL and be educated on proper body mechanics in order to safely return to the gym.  PT Short Term Goal 4 - Progress: Met PT Long Term Goals Time to Complete Long Term Goals:  (6 weeks) PT Long Term Goal 1: Pt will report his FOTO score: Status > 54% and Limitation < 46% for improved percieved functional ability.  PT Long Term Goal 1 - Progress: Met PT Long Term Goal 2: Pt will improve his postural strength in order to lift 3lbs above his head into a cabinet with his LUE in order to put dishes away.   PT Long Term Goal 2 - Progress: Met Long Term Goal 3: Pt will improve his BLE and core flexibiliy to WNL in order to go to half kneel without difficulty.  Long Term  Goal 3 Progress: Met Long Term Goal 4: Pt will improve his BLE strength to WNL in order to go from half kneel to stand without UE assist in order to get up from the ground when feeding and caring for his birds.  Long Term Goal 4 Progress: Met  Problem List Patient Active Problem List   Diagnosis Date Noted  . Lumbosacral spondylosis without myelopathy 07/11/2013  . S/P lumbar fusion 07/11/2013  . Back pain, chronic 09/19/2012  . Leg weakness, bilateral 09/19/2012  . Difficulty in walking 09/19/2012  . Tendonitis of shoulder, right 10/26/2011  . Vertigo 10/26/2011  . Pain in joint, shoulder region 10/26/2011  . Shoulder weakness 10/26/2011    PT - End of Session Activity Tolerance: Patient tolerated treatment well General Behavior During Therapy: [facility] for tasks assessed/performed PT Plan of Care PT Patient Instructions: discussed f/u with MD to discuss weight restrictions, discussed FOTO and return to gym activities.  Consulted and Agree with Plan of Care: Patient  GP Functional Assessment Tool Used: FOTO: Status: 61%, Limitation: 39% Functional Limitation: Other PT primary Other PT Primary Goal Status (Z6109): At least 40 percent but less than 60 percent impaired, limited or restricted Other PT Primary Discharge Status [phone]): At least 20 percent but less than 40 percent impaired, limited or restricted  Ronnesha Mester, MPT, ATC 08/10/2013, 10:23 AM  Physician Documentation Your signature is required to indicate approval of the treatment plan as stated above.  Please sign and either send electronically or make a copy of this report for your files and return this physician signed original.   Please mark one 1.__approve of plan  2. ___approve of plan with the following conditions.   ______________________________                                                          _____________________ Physician Signature                                                                                                              Date

## 2015-12-04 ENCOUNTER — Ambulatory Visit (INDEPENDENT_AMBULATORY_CARE_PROVIDER_SITE_OTHER): Payer: Medicare Other

## 2015-12-04 ENCOUNTER — Ambulatory Visit (INDEPENDENT_AMBULATORY_CARE_PROVIDER_SITE_OTHER): Payer: Medicare Other | Admitting: Orthopedic Surgery

## 2015-12-04 ENCOUNTER — Encounter: Payer: Self-pay | Admitting: Orthopedic Surgery

## 2015-12-04 VITALS — BP 132/74 | Ht 70.0 in | Wt 151.0 lb

## 2015-12-04 DIAGNOSIS — M75101 Unspecified rotator cuff tear or rupture of right shoulder, not specified as traumatic: Secondary | ICD-10-CM

## 2015-12-04 DIAGNOSIS — M25511 Pain in right shoulder: Secondary | ICD-10-CM

## 2015-12-04 NOTE — Progress Notes (Signed)
Patient ID: Michael [name], male   DOB: 02/09/1941, 75 y.o.   MRN: [id number]  Chief Complaint  Patient presents with  . Shoulder Pain    Right shoulder pain     Michael [name] is a 75 y.o. , male who now presents with right shoulder pain:   The patient has prior injury to this right shoulder and ring his biceps tendon 3 or so years ago he went for therapy had rotator cuff syndrome is at multiple injections.  Since I saw him last he had a back fusion  Now presents with painful right shoulder is waking him up at night has a catching sensation and often has to move his shoulder around to get back from an elevated position. He describes stabbing aching pain now ongoing for over 3 years. He's been on Aleve for the last 8 weeks or so. Any weakness in the right shoulder as well.   Review of Systems Review of Systems  Genitourinary: Positive for frequency.  Musculoskeletal: Positive for myalgias and back pain.     Past Medical History  Diagnosis Date  . Hypertension   . Coronary artery disease     Past Surgical History  Procedure Laterality Date  . Colonoscopy  07/31/2011    Procedure: COLONOSCOPY;  Surgeon: Dalia Heading;  [facility];  Service: Gastroenterology;  Laterality: N/A;    Family History  Problem Relation Age of Onset  . Heart disease    . Asthma      Social History Social History  Substance Use Topics  . Smoking status: Former Games developer  . Smokeless tobacco: None  . Alcohol Use: No    No Known Allergies  Current Outpatient Prescriptions  Medication Sig Dispense Refill  . donepezil (ARICEPT) 10 MG tablet Take 10 mg by mouth at bedtime.    . fexofenadine (ALLEGRA) 60 MG tablet Take 60 mg by mouth daily as needed. allergies     . Fluticasone-Salmeterol (ADVAIR) 100-50 MCG/DOSE AEPB Inhale 1 puff into the lungs every 12 (twelve) hours.      . rosuvastatin (CRESTOR) 10 MG tablet Take 10 mg by mouth daily.      . valsartan-hydrochlorothiazide  (DIOVAN-HCT) 160-12.5 MG per tablet Take 1 tablet by mouth daily.       No current facility-administered medications for this visit.       Physical Exam Blood pressure 132/74, height  (1.778 m), weight 151 lb (68.493 kg). Physical Exam  Objective:     The patient is awake alert and oriented 3 her mood and affect are normal. he exhibits normal grooming and hygiene without gross deformity.  Gait is normal  noncontributory  On inspection of the right shoulder we find tenderness in the peri-acromial region. The patient exhibits full passive range of motion in the scapular plane for flexion as well as with the arm at his side with external rotation.  There is extreme weakness of the supraspinatus tendon internal and external rotators are normal  No atrophy but he does have a Popeye sign from a previous proximal biceps rupture  The patient is stable in abduction external rotation  The skin is warm dry and intact without erythema laceration or previous incision.  Sensation remains normal and the patient has a normal pulse with good perfusion and a warm extremity to touch  Cervical spine is nontender with full range of motion  Comparison left shoulder examination reveals a normal range of motion normal strength no instability and no  swelling    Data Reviewed My interpretation of the xrays: That of been ordered today are Normal glenohumeral joint with type II acromion Mild-to-moderate greater tuberosity sclerosis  Assessment Rotator cuff tear right shoulder Plan MRI right shoulder open unit follow-up after MRI

## 2015-12-04 NOTE — Patient Instructions (Signed)
We will schedule your MRI and call you with appt

## 2015-12-14 ENCOUNTER — Ambulatory Visit
Admission: RE | Admit: 2015-12-14 | Discharge: 2015-12-14 | Disposition: A | Discharge status: Home / Self Care | Payer: Medicare Other | Source: Ambulatory Visit | Attending: Orthopedic Surgery | Admitting: Orthopedic Surgery

## 2015-12-14 DIAGNOSIS — M75101 Unspecified rotator cuff tear or rupture of right shoulder, not specified as traumatic: Secondary | ICD-10-CM

## 2015-12-17 ENCOUNTER — Telehealth: Payer: Self-pay | Admitting: Orthopedic Surgery

## 2015-12-17 NOTE — Telephone Encounter (Signed)
Scheduled patient an appointment for 12/18/15 to come in and get results

## 2015-12-18 ENCOUNTER — Telehealth: Payer: Self-pay | Admitting: *Deleted

## 2015-12-18 ENCOUNTER — Ambulatory Visit (INDEPENDENT_AMBULATORY_CARE_PROVIDER_SITE_OTHER): Payer: Medicare Other | Admitting: Orthopedic Surgery

## 2015-12-18 VITALS — BP 129/75 | HR 76 | Ht 70.0 in | Wt 151.0 lb

## 2015-12-18 DIAGNOSIS — M75101 Unspecified rotator cuff tear or rupture of right shoulder, not specified as traumatic: Secondary | ICD-10-CM

## 2015-12-18 NOTE — Progress Notes (Signed)
Chief Complaint  Patient presents with  . Results    MRI RESULTS   Follow-up after MRI of his right shoulder. He complains of pain he still has fairly good motion but painful motion in the right shoulder. His MRI shows fatty atrophy of his rotator cuff tear which is probably a chronic tear has had a previous biceps tendon tear.  After review this MRI I've advised him to seek consultation with Dr. Ave Filterhandler for possible reverse shoulder replacement think is a good candidate.  We will make the referral

## 2015-12-18 NOTE — Patient Instructions (Addendum)
We will refer you to a shoulder specialist Dr. Jackquline BoschJesse Chandler in [place]

## 2015-12-18 NOTE — Telephone Encounter (Signed)
Faxed referral and office notes to [facility]. Awaiting appointment.

## 2015-12-25 NOTE — Telephone Encounter (Signed)
PER BEVERLY AT [facility], REFERRAL RECEIVED AND SHE IS CURRENTLY WORKING ON SCHEDULING

## 2016-01-24 ENCOUNTER — Encounter (HOSPITAL_COMMUNITY): Payer: Self-pay | Admitting: Occupational Therapy

## 2016-01-24 ENCOUNTER — Ambulatory Visit (HOSPITAL_COMMUNITY): Payer: Medicare Other | Attending: Orthopedic Surgery | Admitting: Occupational Therapy

## 2016-01-24 DIAGNOSIS — M6281 Muscle weakness (generalized): Secondary | ICD-10-CM | POA: Insufficient documentation

## 2016-01-24 DIAGNOSIS — Z9889 Other specified postprocedural states: Secondary | ICD-10-CM | POA: Insufficient documentation

## 2016-01-24 DIAGNOSIS — M25512 Pain in left shoulder: Secondary | ICD-10-CM

## 2016-01-24 NOTE — Therapy (Signed)
[facility] [address] [place], [place], [address] Phone: [phone]   Fax:  [phone]  Occupational Therapy Evaluation  Patient Details  Name: Michael [name] MRN: [id number] Date of Birth: 1941-07-25 Referring Provider: Dr. Jones Broom  Encounter Date: 01/24/2016      OT End of Session - 01/24/16 1216    Visit Number 1   Number of Visits 8   Date for OT Re-Evaluation 02/23/16   Authorization Type [employer] Medicare   Authorization Time Period Before 10th visit   Authorization - Visit Number 1   Authorization - Number of Visits 10   OT Start Time [phone]   OT Stop Time 1020   OT Time Calculation (min) 31 min   Activity Tolerance Patient tolerated treatment well   Behavior During Therapy [facility] for tasks assessed/performed      Past Medical History  Diagnosis Date  . Hypertension   . Coronary artery disease     Past Surgical History  Procedure Laterality Date  . Colonoscopy  07/31/2011    Procedure: COLONOSCOPY;  Surgeon: Dalia Heading;  [facility];  Service: Gastroenterology;  Laterality: N/A;    There were no vitals filed for this visit.      Subjective Assessment - 01/24/16 1211    Subjective  S: I tore this right rotator cuff several years ago.    Pertinent History Pt is a 75 y/o male s/p right shoulder debridement with irrepairable RCT. Pt report MD preferred to try a debridement with therapy prior to continuing with total shoulder replacement. Pt had debridement the first week of April (pt believes the 4th) and was referred to occupational therapy for evaluation and treatment by Dr. Jones Broom.    Special Tests 49/100 (51% impairment)   Patient Stated Goals To be able to use my arm better.    Currently in Pain? No/denies           [facility] OT Assessment - 01/24/16 0949    Assessment   Diagnosis s/p debridement R shoulder   irrepairable R RCT   Referring Provider Dr. Jones Broom   Onset  Date 01/14/16  approximate date of debridement   Prior Therapy None   Precautions   Precautions None   Restrictions   Weight Bearing Restrictions No   Balance Screen   Has the patient fallen in the past 6 months No   Has the patient had a decrease in activity level because of a fear of falling?  No   Is the patient reluctant to leave their home because of a fear of falling?  No   Home  Environment   Family/patient expects to be discharged to: Private residence   Living Arrangements Spouse/significant other   Prior Function   Level of Independence Independent   Vocation Retired   Leisure raising birds, walking   ADL   ADL comments Pt is having difficulty reaching overhead, reaching across the body, dressing tasks, lifting objects, turning steering wheel to left   Written Expression   Dominant Hand Right   Cognition   Overall Cognitive Status Within Functional Limits for tasks assessed   ROM / Strength   AROM / PROM / Strength AROM;PROM;Strength   Palpation   Palpation comment Mod fascial restrictions in the right upper arm, trapezius, and scapularis regions.    AROM   Overall AROM Comments Assessed seated, ER/IR adducted   AROM Assessment Site Shoulder   Right/Left Shoulder Right   Right  Shoulder Flexion 134 Degrees   Right Shoulder ABduction 160 Degrees   Right Shoulder Internal Rotation 90 Degrees   Right Shoulder External Rotation 90 Degrees   PROM   Overall PROM Comments Assessed supine, ER/IR adducted   PROM Assessment Site Shoulder   Right/Left Shoulder Right   Right Shoulder Flexion 166 Degrees   Right Shoulder ABduction 180 Degrees   Right Shoulder Internal Rotation 90 Degrees   Right Shoulder External Rotation 90 Degrees   Strength   Overall Strength Comments Assessed seated, ER/IR adducted   Strength Assessment Site Shoulder   Right/Left Shoulder Right   Right Shoulder Flexion 4-/5   Right Shoulder ABduction 3/5   Right Shoulder Internal Rotation 4-/5    Right Shoulder External Rotation 3+/5                         OT Education - 01/24/16 1216    Education provided Yes   Education Details shoulder stretches   Person(s) Educated Patient   Methods Explanation;Demonstration;Handout   Comprehension Verbalized understanding;Returned demonstration          OT Short Term Goals - 01/24/16 1219    OT SHORT TERM GOAL #1   Title Pt will be educated on HEP.    Time 4   Period Weeks   Status New   OT SHORT TERM GOAL #2   Title Pt will decrease pain to 2/10 or less to increase ability to use RUE during functional tasks.    Time 4   Period Weeks   Status New   OT SHORT TERM GOAL #3   Title Pt will decrease fascial restrictions to min amounts or less in the RUE to increase mobility necessary for daily tasks.    Time 4   Period Weeks   Status New   OT SHORT TERM GOAL #4   Title Pt will increase A/ROM to [facility] to increase ability to reach into overhead cabinets using right hand as dominant.    Time 4   Period Weeks   Status New   OT SHORT TERM GOAL #5   Title Pt will increase strength to 4/5 to increase ability to lift 25 pound bags of bird feed using RUE as dominant.    Time 4   Period Weeks   Status New                  Plan - 01/24/16 1216    Clinical Impression Statement A: Pt is a 75 y/o male s/p right shoulder debridement with irrepairable RCT presenting with increased pain and fascial restrictions, decreased range of motion and strength, limiting functional use of the RUE during daily and leisure task. Provided pt with shoulder stretches for HEP.    Rehab Potential Good   OT Frequency 2x / week   OT Duration 4 weeks   OT Treatment/Interventions Self-care/ADL training;Passive range of motion;Patient/family education;Cryotherapy;Electrical Stimulation;Moist Heat;Therapeutic exercise;Manual Therapy;Therapeutic activities   Plan P: Pt will benefit from skilled OT services to decrease pain and fascial  restrictions, increase range of motion, strength, and functional use of the RUE. Treatment plan: myofascial release, manual therapy, P/ROM, AA/ROM, A/ROM, scapular stability & strengthening, general RUE strengthening   OT Home Exercise Plan shoulder stretches   Consulted and Agree with Plan of Care Patient      Patient will benefit from skilled therapeutic intervention in order to improve the following deficits and impairments:  Decreased strength, Pain, Impaired UE functional use, Decreased  range of motion, Increased fascial restricitons, Impaired flexibility  Visit Diagnosis: S/P debridement  Pain in left shoulder  Muscle weakness (generalized)      G-Codes - 02/16/2016 1223    Functional Assessment Tool Used FOTO Score: 49/100 (51% impairment)   Functional Limitation Carrying, moving and handling objects   Carrying, Moving and Handling Objects Current Status (Z6109) At least 40 percent but less than 60 percent impaired, limited or restricted   Carrying, Moving and Handling Objects Goal Status (U0454) At least 20 percent but less than 40 percent impaired, limited or restricted      Problem List Patient Active Problem List   Diagnosis Date Noted  . Lumbosacral spondylosis without myelopathy 07/11/2013  . S/P lumbar fusion 07/11/2013  . Back pain, chronic 09/19/2012  . Leg weakness, bilateral 09/19/2012  . Difficulty in walking(719.7) 09/19/2012  . Tendonitis of shoulder, right 10/26/2011  . Vertigo 10/26/2011  . Pain in joint, shoulder region 10/26/2011  . Shoulder weakness 10/26/2011    Ezra Sites, OTR/L  [phone]  02/16/16, 12:24 PM  [facility] [address] [place], [place], [address] Phone: [phone]   Fax:  [phone]  Name: Michael [name] MRN: [id number] Date of Birth: [birth year]

## 2016-01-24 NOTE — Patient Instructions (Signed)
  1) Flexion Wall Stretch    Face wall, place affected handon wall in front of you. Slide hand up the wall  and lean body in towards the wall. Hold for 5 seconds. Repeat 10 times. 1-2 times/day.     2) Towel Stretch with Internal Rotation   Gently pull up your affected arm  behind your back with the assist of a towel            3) Corner Stretch    Stand at a corner of a wall, place your arms on the walls with elbows bent. Lean into the corner until a stretch is felt along the front of your chest and/or shoulders. Hold for 5 seconds. Repeat 10X, 1-2 times/day.    4) Posterior Capsule Stretch    Bring the involved arm across chest. Grasp elbow and pull toward chest until you feel a stretch in the back of the upper arm and shoulder. Hold 5 seconds. Repeat 10X. Complete 1-2 times/day.    5) Scapular Retraction    Tuck chin back as you pinch shoulder blades together.  Hold 5 seconds. Repeat 10X. Complete 1-2 times/day.    6) External Rotation Stretch:     Place your affected hand on the wall with the elbow bent and gently turn your body the opposite direction until a stretch is felt.  

## 2016-01-27 ENCOUNTER — Ambulatory Visit (HOSPITAL_COMMUNITY): Payer: Medicare Other

## 2016-01-27 ENCOUNTER — Encounter (HOSPITAL_COMMUNITY): Payer: Self-pay

## 2016-01-27 DIAGNOSIS — M25512 Pain in left shoulder: Secondary | ICD-10-CM

## 2016-01-27 DIAGNOSIS — Z9889 Other specified postprocedural states: Secondary | ICD-10-CM | POA: Diagnosis not present

## 2016-01-27 DIAGNOSIS — M6281 Muscle weakness (generalized): Secondary | ICD-10-CM

## 2016-01-27 NOTE — Therapy (Signed)
[facility] [address] [place], [place], [address] Phone: [phone]   Fax:  [phone]  Occupational Therapy Treatment  Patient Details  Name: Michael [name] MRN: [id number] Date of Birth: 02/19/1941 Referring Provider: Dr. Jones BroomJustin Chandler  Encounter Date: 01/27/2016      OT End of Session - 01/27/16 1206    Visit Number 2   Number of Visits 8   Date for OT Re-Evaluation 02/23/16   Authorization Type U[employer] Medicare   Authorization Time Period Before 10th visit   Authorization - Visit Number 2   Authorization - Number of Visits 10   OT Start Time 1120   OT Stop Time 1200   OT Time Calculation (min) 40 min   Activity Tolerance Patient tolerated treatment well   Behavior During Therapy [facility] for tasks assessed/performed      Past Medical History  Diagnosis Date  . Hypertension   . Coronary artery disease     Past Surgical History  Procedure Laterality Date  . Colonoscopy  07/31/2011    Procedure: COLONOSCOPY;  Surgeon: Dalia HeadingMark A Jenkins;  [facility];  Service: Gastroenterology;  Laterality: N/A;    There were no vitals filed for this visit.      Subjective Assessment - 01/27/16 1125    Subjective  S: It's painful but if it catches it's really bad.   Currently in Pain? Yes   Pain Score 5    Pain Location Shoulder   Pain Orientation Right   Pain Descriptors / Indicators Aching   Pain Type Acute pain   Pain Onset In the past 7 days   Pain Frequency Intermittent   Aggravating Factors  Movement, lifting things. Reachingh up or out.   Pain Relieving Factors Tylenol   Effect of Pain on Daily Activities Limited use of RUE during daily tasks   Multiple Pain Sites No            [facility] OT Assessment - 01/27/16 1127    Assessment   Diagnosis s/p debridement R shoulder    Referring Provider Dr. Jones BroomJustin Chandler   Precautions   Precautions None                  OT Treatments/Exercises (OP) -  01/27/16 1127    Exercises   Exercises Shoulder   Shoulder Exercises: Supine   Protraction PROM;5 reps;AAROM;10 reps   Horizontal ABduction PROM;5 reps;AAROM;10 reps   External Rotation PROM;5 reps;AAROM;10 reps   Internal Rotation PROM;5 reps;AAROM;10 reps   Flexion PROM;5 reps;AAROM;10 reps   ABduction PROM;5 reps;AAROM;10 reps   Shoulder Exercises: Standing   Protraction AAROM;10 reps   Horizontal ABduction AAROM;10 reps   External Rotation AAROM;10 reps   Internal Rotation AAROM;10 reps   Flexion AAROM;10 reps   ABduction AAROM;10 reps   Shoulder Exercises: ROM/Strengthening   Wall Wash 1'   Over Head Lace 1'   Manual Therapy   Manual Therapy Myofascial release   Manual therapy comments Manual therapy was completed prior to exercises this session.   Myofascial Release Myofascial release and manual stretching to left upper arm, trapezius, and scapularis to decrease fascial restrictions and increase joint mobility in a pain free zone.                 OT Education - 01/27/16 1206    Education provided Yes   Education Details Reviewed evaluation, discussed HEP.   Person(s) Educated Patient   Methods Explanation;Handout   Comprehension Verbalized  understanding          OT Short Term Goals - 01/24/16 1219    OT SHORT TERM GOAL #1   Title Pt will be educated on HEP.    Time 4   Period Weeks   Status New   OT SHORT TERM GOAL #2   Title Pt will decrease pain to 2/10 or less to increase ability to use RUE during functional tasks.    Time 4   Period Weeks   Status New   OT SHORT TERM GOAL #3   Title Pt will decrease fascial restrictions to min amounts or less in the RUE to increase mobility necessary for daily tasks.    Time 4   Period Weeks   Status New   OT SHORT TERM GOAL #4   Title Pt will increase A/ROM to [facility] to increase ability to reach into overhead cabinets using right hand as dominant.    Time 4   Period Weeks   Status New   OT SHORT TERM GOAL #5    Title Pt will increase strength to 4/5 to increase ability to lift 25 pound bags of bird feed using RUE as dominant.    Time 4   Period Weeks   Status New                  Plan - 01/27/16 1207    Clinical Impression Statement A: Initiated treatment this date. Introduced myofascial release and P/ROM prior to exercises. Patient completed AA/ROM supine and standing, experiencing increased pain with horizontal aBduction in supine. Therapist provided verbal cues for form and technique during AA/ROM exercises. Patient completed wall wash and overhead lacing without complaint of pain or discomfort.   Plan P: Discontinue AA/ROM initiate A/ROM exercises, add proximal shoulder strengthning in supine and standing.      Patient will benefit from skilled therapeutic intervention in order to improve the following deficits and impairments:  Decreased strength, Pain, Impaired UE functional use, Decreased range of motion, Increased fascial restricitons, Impaired flexibility  Visit Diagnosis: Pain in left shoulder  Muscle weakness (generalized)    Problem List Patient Active Problem List   Diagnosis Date Noted  . Lumbosacral spondylosis without myelopathy 07/11/2013  . S/P lumbar fusion 07/11/2013  . Back pain, chronic 09/19/2012  . Leg weakness, bilateral 09/19/2012  . Difficulty in walking(719.7) 09/19/2012  . Tendonitis of shoulder, right 10/26/2011  . Vertigo 10/26/2011  . Pain in joint, shoulder region 10/26/2011  . Shoulder weakness 10/26/2011    Herbert Moors OTA student 01/27/2016, 12:13 PM  [facility] [address] [place], [place], [address] Phone: [phone]   Fax:  [phone]  Name: Michael [name] MRN: [id number] Date of Birth: 1940/10/25  Limmie Patricia, OTR/L,CBIS  [phone]  This entire session was guided, instructed, and directly supervised by Limmie Patricia, OTR/L, CBIS.

## 2016-01-31 ENCOUNTER — Encounter (HOSPITAL_COMMUNITY): Payer: Self-pay | Admitting: Occupational Therapy

## 2016-01-31 ENCOUNTER — Ambulatory Visit (HOSPITAL_COMMUNITY): Payer: Medicare Other | Admitting: Occupational Therapy

## 2016-01-31 DIAGNOSIS — M6281 Muscle weakness (generalized): Secondary | ICD-10-CM

## 2016-01-31 DIAGNOSIS — Z9889 Other specified postprocedural states: Secondary | ICD-10-CM | POA: Diagnosis not present

## 2016-01-31 DIAGNOSIS — M25512 Pain in left shoulder: Secondary | ICD-10-CM

## 2016-01-31 NOTE — Patient Instructions (Signed)
1) Shoulder Protraction    Begin with elbows by your side, slowly "punch" straight out in front of you keeping arms/elbows straight.      2) Shoulder Flexion  Supine:     Standing:         Begin with arms at your side with thumbs pointed up, slowly raise both arms up and forward towards overhead.         3) Horizontal abduction/adduction  Supine:   Standing:           Begin with arms straight out in front of you, bring out to the side in at "T" shape. Keep arms straight entire time.         4) Internal & External Rotation    *No band* -Stand with elbows at the side and elbows bent 90 degrees. Move your forearms away from your body, then bring back inward toward the body.     5) Shoulder Abduction  Supine:     Standing:       Lying on your back begin with your arms flat on the table next to your side. Slowly move your arms out to the side so that they go overhead, in a jumping jack or snow angel movement.      Repeat all exercises 10-15 times, 1-2 times per day.  

## 2016-01-31 NOTE — Therapy (Signed)
[place], [place], [address] Phone: [phone]   Fax:  [phone]  Occupational Therapy Treatment  Patient Details  Name: Michael [name] MRN: [id number] Date of Birth: 01/23/1941 Referring Provider: Dr. Tania Ade  Encounter Date: 01/31/2016      OT End of Session - 01/31/16 1103    Visit Number 3   Number of Visits 8   Date for OT Re-Evaluation 02/23/16   Authorization Type UHC Medicare   Authorization Time Period Before 10th visit   Authorization - Visit Number 3   Authorization - Number of Visits 10   OT Start Time 0945   OT Stop Time 1028   OT Time Calculation (min) 43 min   Activity Tolerance Patient tolerated treatment well   Behavior During Therapy [facility] for tasks assessed/performed      Past Medical History  Diagnosis Date  . Hypertension   . Coronary artery disease     Past Surgical History  Procedure Laterality Date  . Colonoscopy  07/31/2011    Procedure: COLONOSCOPY;  Surgeon: Jamesetta So;  [facility];  Service: Gastroenterology;  Laterality: N/A;    There were no vitals filed for this visit.      Subjective Assessment - 01/31/16 0949    Subjective  S: That one exercise where you push your arm up the wall is the most uncomfortable.    Currently in Pain? Yes   Pain Score 1    Pain Location Shoulder   Pain Orientation Right   Pain Descriptors / Indicators Aching   Pain Type Acute pain   Pain Radiating Towards none   Pain Onset 1 to 4 weeks ago   Pain Frequency Intermittent   Aggravating Factors  movement, lifting   Pain Relieving Factors pain medications   Effect of Pain on Daily Activities Limited use of RUE during daily tasks   Multiple Pain Sites No            [facility] OT Assessment - 01/31/16 1102    Assessment   Diagnosis s/p debridement R shoulder    Precautions   Precautions None                  OT Treatments/Exercises (OP) -  01/31/16 0950    Exercises   Exercises Shoulder   Shoulder Exercises: Supine   Protraction PROM;5 reps;AROM;12 reps   Horizontal ABduction PROM;5 reps;AROM;12 reps   External Rotation PROM;5 reps;AROM;12 reps   Internal Rotation PROM;5 reps;AROM;12 reps   Flexion PROM;5 reps;AROM;12 reps   ABduction PROM;5 reps;AROM;12 reps   Shoulder Exercises: Standing   Protraction AROM;12 reps   Horizontal ABduction AROM;12 reps   External Rotation AROM;12 reps   Internal Rotation AROM;12 reps   Flexion AROM;12 reps   ABduction AROM;12 reps   Shoulder Exercises: Therapy Ball   Right/Left 5 reps  both sides   Shoulder Exercises: ROM/Strengthening   Wall Wash 2'   Proximal Shoulder Strengthening, Supine 10X each no rest breaks   Proximal Shoulder Strengthening, Seated 10X each no rest breaks   Prot/Ret//Elev/Dep 1'   Rhythmic Stabilization, Supine 90 and 120 degrees, 30X, min difficulty   Manual Therapy   Manual Therapy Myofascial release   Manual therapy comments Manual therapy was completed prior to exercises this session.   Myofascial Release Myofascial release and manual stretching to left upper arm, trapezius, and scapularis to decrease fascial restrictions and increase joint mobility in a pain free zone.  OT Education - 01/31/16 1025    Education provided Yes   Education Details A/ROM exercises    Person(s) Educated Patient   Methods Explanation;Demonstration;Handout   Comprehension Verbalized understanding;Returned demonstration          OT Short Term Goals - 01/27/16 1259    OT SHORT TERM GOAL #1   Title Pt will be educated on HEP.    Time 4   Period Weeks   Status On-going   OT SHORT TERM GOAL #2   Title Pt will decrease pain to 2/10 or less to increase ability to use RUE during functional tasks.    Time 4   Period Weeks   Status On-going   OT SHORT TERM GOAL #3   Title Pt will decrease fascial restrictions to min amounts or less in the RUE to  increase mobility necessary for daily tasks.    Time 4   Period Weeks   Status On-going   OT SHORT TERM GOAL #4   Title Pt will increase A/ROM to [facility] to increase ability to reach into overhead cabinets using right hand as dominant.    Time 4   Period Weeks   Status On-going   OT SHORT TERM GOAL #5   Title Pt will increase strength to 4/5 to increase ability to lift 25 pound bags of bird feed using RUE as dominant.    Time 4   Period Weeks   Status On-going                  Plan - 01/31/16 1103    Clinical Impression Statement A: Added A/ROM, prot/ret/elev/dep, increased wall wash to 2 minutes, added therapy ball circles. Pt with good form, intermittent verbal cuing required, minimal soreness noted at end of session. Provided pt with A/ROM HEP.    Rehab Potential Good   OT Frequency 2x / week   OT Duration 4 weeks   OT Treatment/Interventions Self-care/ADL training;Passive range of motion;Patient/family education;Cryotherapy;Electrical Stimulation;Moist Heat;Therapeutic exercise;Manual Therapy;Therapeutic activities   Plan P: Follow up on A/ROM HEP, add scapular theraband   OT Home Exercise Plan A/ROM HEP   Consulted and Agree with Plan of Care Patient      Patient will benefit from skilled therapeutic intervention in order to improve the following deficits and impairments:  Decreased strength, Pain, Impaired UE functional use, Decreased range of motion, Increased fascial restricitons, Impaired flexibility  Visit Diagnosis: Pain in left shoulder  Muscle weakness (generalized)    Problem List Patient Active Problem List   Diagnosis Date Noted  . Lumbosacral spondylosis without myelopathy 07/11/2013  . S/P lumbar fusion 07/11/2013  . Back pain, chronic 09/19/2012  . Leg weakness, bilateral 09/19/2012  . Difficulty in walking(719.7) 09/19/2012  . Tendonitis of shoulder, right 10/26/2011  . Vertigo 10/26/2011  . Pain in joint, shoulder region 10/26/2011  .  Shoulder weakness 10/26/2011    Guadelupe Sabin, OTR/L  [phone]  01/31/2016, 11:08 AM  [place] [address] [place], [place], [address] Phone: [phone]   Fax:  [phone]  Name: Michael [name] MRN: [id number] Date of Birth: [birth year]

## 2016-02-03 ENCOUNTER — Ambulatory Visit (HOSPITAL_COMMUNITY): Payer: Medicare Other | Admitting: Specialist

## 2016-02-03 DIAGNOSIS — Z9889 Other specified postprocedural states: Secondary | ICD-10-CM | POA: Diagnosis not present

## 2016-02-03 DIAGNOSIS — M25512 Pain in left shoulder: Secondary | ICD-10-CM

## 2016-02-03 DIAGNOSIS — M6281 Muscle weakness (generalized): Secondary | ICD-10-CM

## 2016-02-03 NOTE — Therapy (Signed)
[facility] [address] [place], [place], [address] Phone: [phone]   Fax:  [phone]  Occupational Therapy Treatment  Patient Details  Name: Michael [name] MRN: [id number] Date of Birth: 05/24/1941 Referring Provider: Dr. Jones BroomJustin Chandler  Encounter Date: 02/03/2016      OT End of Session - 02/03/16 1023    Visit Number 4   Number of Visits 8   Date for OT Re-Evaluation 02/23/16   Authorization Type UHC Medicare   Authorization Time Period Before 10th visit   Authorization - Visit Number 4   Authorization - Number of Visits 10   OT Start Time [phone]   OT Stop Time 1035   OT Time Calculation (min) 40 min   Activity Tolerance Patient tolerated treatment well   Behavior During Therapy [facility] for tasks assessed/performed      Past Medical History  Diagnosis Date  . Hypertension   . Coronary artery disease     Past Surgical History  Procedure Laterality Date  . Colonoscopy  07/31/2011    Procedure: COLONOSCOPY;  Surgeon: Dalia HeadingMark A Jenkins;  [facility];  Service: Gastroenterology;  Laterality: N/A;    There were no vitals filed for this visit.      Subjective Assessment - 02/03/16 0955    Subjective  S: I have been doing the new exercises they gave me.   Currently in Pain? Yes   Pain Score 3    Pain Location Shoulder            [facility] OT Assessment - 02/03/16 0001    Assessment   Diagnosis s/p debridement R shoulder    Precautions   Precautions None                  OT Treatments/Exercises (OP) - 02/03/16 0001    Exercises   Exercises Shoulder   Shoulder Exercises: Supine   Protraction PROM;5 reps;Strengthening;10 reps   Protraction Weight (lbs) 1   Horizontal ABduction PROM;5 reps;Strengthening;10 reps   Horizontal ABduction Weight (lbs) 1   External Rotation PROM;5 reps;Strengthening;10 reps   External Rotation Weight (lbs) 1   Internal Rotation PROM;5 reps;Strengthening;10 reps    Internal Rotation Weight (lbs) 1   Flexion PROM;5 reps;Strengthening;10 reps   Shoulder Flexion Weight (lbs) 1   ABduction PROM;5 reps;Strengthening;10 reps   Shoulder ABduction Weight (lbs) 1   Shoulder Exercises: Standing   Protraction AROM;15 reps   Horizontal ABduction AROM;15 reps   External Rotation AROM;Theraband;15 reps   Theraband Level (Shoulder External Rotation) Level 2 (Red)   Internal Rotation AROM;Theraband;15 reps   Theraband Level (Shoulder Internal Rotation) Level 2 (Red)   Flexion AROM;15 reps   ABduction AROM;15 reps   Extension Theraband;15 reps   Theraband Level (Shoulder Extension) Level 2 (Red)   Row Theraband;15 reps   Theraband Level (Shoulder Row) Level 2 (Red)   Retraction Theraband;15 reps   Theraband Level (Shoulder Retraction) Level 2 (Red)   Shoulder Exercises: ROM/Strengthening   UBE (Upper Arm Bike) 2' forward and 2' reverse at 1.0   Wall Wash 2'   Manual Therapy   Manual Therapy Myofascial release   Manual therapy comments Manual therapy was completed prior to exercises this session.   Myofascial Release Myofascial release and manual stretching to left upper arm, trapezius, and scapularis to decrease fascial restrictions and increase joint mobility in a pain free zone.  OT Short Term Goals - 01/27/16 1259    OT SHORT TERM GOAL #1   Title Pt will be educated on HEP.    Time 4   Period Weeks   Status On-going   OT SHORT TERM GOAL #2   Title Pt will decrease pain to 2/10 or less to increase ability to use RUE during functional tasks.    Time 4   Period Weeks   Status On-going   OT SHORT TERM GOAL #3   Title Pt will decrease fascial restrictions to min amounts or less in the RUE to increase mobility necessary for daily tasks.    Time 4   Period Weeks   Status On-going   OT SHORT TERM GOAL #4   Title Pt will increase A/ROM to [facility] to increase ability to reach into overhead cabinets using right hand as dominant.     Time 4   Period Weeks   Status On-going   OT SHORT TERM GOAL #5   Title Pt will increase strength to 4/5 to increase ability to lift 25 pound bags of bird feed using RUE as dominant.    Time 4   Period Weeks   Status On-going                  Plan - 02/03/16 1024    Clinical Impression Statement A:  Added 1# resistance in supine today.  Patient completed these strengthening activities with good form and min verbal gudiance for technique.   Plan P:  Increase strengthening repetitions to 12 in supine.  add sidelying A/ROM for increased difficulty as we increase ability to use right arm functionally in everyday tasks.  and issue scapular theraband exercises for HEP.      Patient will benefit from skilled therapeutic intervention in order to improve the following deficits and impairments:  Decreased strength, Pain, Impaired UE functional use, Decreased range of motion, Increased fascial restricitons, Impaired flexibility  Visit Diagnosis: Pain in left shoulder  Muscle weakness (generalized)    Problem List Patient Active Problem List   Diagnosis Date Noted  . Lumbosacral spondylosis without myelopathy 07/11/2013  . S/P lumbar fusion 07/11/2013  . Back pain, chronic 09/19/2012  . Leg weakness, bilateral 09/19/2012  . Difficulty in walking(719.7) 09/19/2012  . Tendonitis of shoulder, right 10/26/2011  . Vertigo 10/26/2011  . Pain in joint, shoulder region 10/26/2011  . Shoulder weakness 10/26/2011    Shirlean Mylar, OTR/L [phone]  02/03/2016, 10:34 AM  [facility] [address] [place], [place], [address] Phone: [phone]   Fax:  [phone]  Name: Michael [name] MRN: [id number] Date of Birth: 1941-05-08

## 2016-02-06 ENCOUNTER — Encounter (HOSPITAL_COMMUNITY): Payer: Medicare Other | Admitting: Occupational Therapy

## 2016-02-07 ENCOUNTER — Ambulatory Visit (HOSPITAL_COMMUNITY): Payer: Medicare Other | Admitting: Specialist

## 2016-02-07 DIAGNOSIS — M25512 Pain in left shoulder: Secondary | ICD-10-CM

## 2016-02-07 DIAGNOSIS — Z9889 Other specified postprocedural states: Secondary | ICD-10-CM | POA: Diagnosis not present

## 2016-02-07 DIAGNOSIS — M6281 Muscle weakness (generalized): Secondary | ICD-10-CM

## 2016-02-07 NOTE — Therapy (Signed)
[facility] [address] [place], [place], [address] Phone: [phone]   Fax:  [phone]  Occupational Therapy Treatment  Patient Details  Name: Michael [name] MRN: [id number] Date of Birth: August 25, 1941 Referring Provider: Dr. Jones Broom  Encounter Date: 02/07/2016      OT End of Session - 02/07/16 1046    Visit Number 5   Number of Visits 8   Date for OT Re-Evaluation 02/23/16   Authorization Type UHC Medicare   Authorization Time Period Before 10th visit   Authorization - Visit Number 5   Authorization - Number of Visits 10   OT Start Time [phone]   OT Stop Time 1039   OT Time Calculation (min) 44 min   Activity Tolerance Patient tolerated treatment well   Behavior During Therapy [facility] for tasks assessed/performed      Past Medical History  Diagnosis Date  . Hypertension   . Coronary artery disease     Past Surgical History  Procedure Laterality Date  . Colonoscopy  07/31/2011    Procedure: COLONOSCOPY;  Surgeon: Dalia Heading;  [facility];  Service: Gastroenterology;  Laterality: N/A;    There were no vitals filed for this visit.      Subjective Assessment - 02/07/16 0955    Subjective  S:  I may be doing a bit too much at home   Currently in Pain? Yes   Pain Score 3    Pain Location Shoulder   Pain Orientation Right   Pain Descriptors / Indicators Sharp   Pain Type Acute pain   Pain Radiating Towards none   Pain Onset 1 to 4 weeks ago   Pain Frequency Intermittent   Aggravating Factors  certain movements   Pain Relieving Factors rest   Effect of Pain on Daily Activities limited use of RUE wtih functional activities             [facility] OT Assessment - 02/07/16 0001    Assessment   Diagnosis s/p debridement R shoulder    Precautions   Precautions None                  OT Treatments/Exercises (OP) - 02/07/16 0001    Exercises   Exercises Shoulder   Shoulder Exercises:  Supine   Protraction PROM;5 reps;Strengthening;10 reps   Protraction Weight (lbs) 2   Horizontal ABduction PROM;5 reps;Strengthening;10 reps   Horizontal ABduction Weight (lbs) 2   External Rotation PROM;5 reps;Strengthening;10 reps   External Rotation Weight (lbs) 2   Internal Rotation PROM;5 reps;Strengthening;10 reps   Internal Rotation Weight (lbs) 2   Flexion PROM;5 reps;Strengthening;10 reps   Shoulder Flexion Weight (lbs) 2   ABduction PROM;5 reps;Strengthening;10 reps   Shoulder ABduction Weight (lbs) 2   Shoulder Exercises: Seated   Protraction AROM;15 reps   Horizontal ABduction AROM;15 reps   External Rotation AROM;15 reps   Internal Rotation AROM;15 reps   Flexion AROM;15 reps   Abduction AROM;15 reps   Shoulder Exercises: Sidelying   External Rotation AROM;10 reps   Internal Rotation AROM;10 reps   Flexion AROM;10 reps   ABduction AROM;10 reps   Shoulder Exercises: Standing   External Rotation Theraband;15 reps   Theraband Level (Shoulder External Rotation) Level 2 (Red)   Internal Rotation Theraband;15 reps   Theraband Level (Shoulder Internal Rotation) Level 2 (Red)   Extension Theraband;15 reps   Theraband Level (Shoulder Extension) Level 2 (Red)   Row Theraband;15 reps  Theraband Level (Shoulder Row) Level 2 (Red)   Retraction Theraband;15 reps   Theraband Level (Shoulder Retraction) Level 2 (Red)   Shoulder Exercises: ROM/Strengthening   UBE (Upper Arm Bike) 3' forward and 3' reverse at 1.0   "W" Arms 10 times with max verbal guidance   X to V Arms 10 times with max verbal guidance   Proximal Shoulder Strengthening, Supine 10X each no rest breaks   Proximal Shoulder Strengthening, Seated 10X each no rest breaks   Manual Therapy   Manual Therapy Myofascial release   Manual therapy comments Manual therapy was completed prior to exercises this session.   Myofascial Release Myofascial release and manual stretching to left upper arm, trapezius, and  scapularis to decrease fascial restrictions and increase joint mobility in a pain free zone.                   OT Short Term Goals - 01/27/16 1259    OT SHORT TERM GOAL #1   Title Pt will be educated on HEP.    Time 4   Period Weeks   Status On-going   OT SHORT TERM GOAL #2   Title Pt will decrease pain to 2/10 or less to increase ability to use RUE during functional tasks.    Time 4   Period Weeks   Status On-going   OT SHORT TERM GOAL #3   Title Pt will decrease fascial restrictions to min amounts or less in the RUE to increase mobility necessary for daily tasks.    Time 4   Period Weeks   Status On-going   OT SHORT TERM GOAL #4   Title Pt will [facility] to [facility] to increase ability to reach into overhead cabinets using right hand as dominant.    Time 4   Period Weeks   Status On-going   OT SHORT TERM GOAL #5   Title Pt will increase strength to 4/5 to increase ability to lift 25 pound bags of bird feed using RUE as dominant.    Time 4   Period Weeks   Status On-going                  Plan - 02/07/16 1046    Clinical Impression Statement A:  Attempted proximal shoulder strengthening in supine with 2# which was too difficult so compelted without resistance.  Increased to 2# for strengthening in supine and added sidelying A/ROM   Plan P:  Attempt 1# resistance in sidelying, issue scapular tband for hep.      Patient will benefit from skilled therapeutic intervention in order to improve the following deficits and impairments:  Decreased strength, Pain, Impaired UE functional use, Decreased range of motion, Increased fascial restricitons, Impaired flexibility  Visit Diagnosis: Pain in left shoulder  Muscle weakness (generalized)    Problem List Patient Active Problem List   Diagnosis Date Noted  . Lumbosacral spondylosis without myelopathy 07/11/2013  . S/P lumbar fusion 07/11/2013  . Back pain, chronic 09/19/2012  . Leg weakness, bilateral  09/19/2012  . Difficulty in walking(719.7) 09/19/2012  . Tendonitis of shoulder, right 10/26/2011  . Vertigo 10/26/2011  . Pain in joint, shoulder region 10/26/2011  . Shoulder weakness 10/26/2011    Shirlean MylarBethany H. Tenesha Garza, OTR/L [phone]  02/07/2016, 10:48 AM   [facility] [address] [place], [place], [address] Phone: [phone]   Fax:  [phone]  Name: Michael [name] MRN: [id number] Date of Birth: 11/11/1940

## 2016-02-11 ENCOUNTER — Ambulatory Visit (HOSPITAL_COMMUNITY): Payer: Medicare Other | Attending: Orthopedic Surgery

## 2016-02-11 ENCOUNTER — Encounter (HOSPITAL_COMMUNITY): Payer: Self-pay

## 2016-02-11 DIAGNOSIS — R29898 Other symptoms and signs involving the musculoskeletal system: Secondary | ICD-10-CM | POA: Diagnosis present

## 2016-02-11 DIAGNOSIS — M6281 Muscle weakness (generalized): Secondary | ICD-10-CM | POA: Insufficient documentation

## 2016-02-11 DIAGNOSIS — M25512 Pain in left shoulder: Secondary | ICD-10-CM | POA: Diagnosis not present

## 2016-02-11 NOTE — Therapy (Signed)
[facility] [address] [place], [place], [address] Phone: [phone]   Fax:  [phone]  Occupational Therapy Treatment  Patient Details  Name: Michael [name] MRN: [id number] Date of Birth: Feb 26, 1941 Referring Provider: Dr. Jones Broom  Encounter Date: 02/11/2016      OT End of Session - 02/11/16 1043    Visit Number 6   Number of Visits 8   Date for OT Re-Evaluation 02/23/16   Authorization Type UHC Medicare   Authorization Time Period Before 10th visit   Authorization - Visit Number 6   Authorization - Number of Visits 10   OT Start Time 0945   OT Stop Time 1035   OT Time Calculation (min) 50 min   Activity Tolerance Patient tolerated treatment well   Behavior During Therapy [facility] for tasks assessed/performed      Past Medical History  Diagnosis Date  . Hypertension   . Coronary artery disease     Past Surgical History  Procedure Laterality Date  . Colonoscopy  07/31/2011    Procedure: COLONOSCOPY;  Surgeon: Dalia Heading;  [facility];  Service: Gastroenterology;  Laterality: N/A;    There were no vitals filed for this visit.      Subjective Assessment - 02/11/16 0948    Subjective  S: I feel a little soreness in it from the exercises and doing work around the house.   Currently in Pain? Yes   Pain Score 3    Pain Location Shoulder   Pain Orientation Right   Pain Descriptors / Indicators Sore   Pain Type Acute pain            [facility] OT Assessment - 02/11/16 0950    Assessment   Diagnosis s/p debridement R shoulder    Precautions   Precautions None   AROM   Overall AROM  Within functional limits for tasks performed   Overall AROM Comments Assessed seated, ER/IR adducted   AROM Assessment Site Shoulder   Right/Left Shoulder Right   PROM   Overall PROM  Within functional limits for tasks performed   Overall PROM Comments Assessed supine, ER/IR adducted   PROM Assessment Site  Shoulder   Right/Left Shoulder Right   Strength   Overall Strength Comments Assessed seated, ER/IR adducted   Strength Assessment Site Shoulder   Right/Left Shoulder Right   Right Shoulder Flexion 4+/5  previous 4-/5   Right Shoulder ABduction 3/5 previous 3-/5   Right Shoulder Internal Rotation 5/5  previous 4-/5   Right Shoulder External Rotation 4+/5  previous 3+/5                  OT Treatments/Exercises (OP) - 02/11/16 0949    Exercises   Exercises Shoulder   Shoulder Exercises: Supine   Protraction PROM;5 reps;Strengthening;10 reps   Protraction Weight (lbs) 2   Horizontal ABduction PROM;5 reps;Strengthening;10 reps   Horizontal ABduction Weight (lbs) 2   External Rotation PROM;5 reps;Strengthening;10 reps   External Rotation Weight (lbs) 2   Internal Rotation PROM;5 reps;Strengthening;10 reps   Internal Rotation Weight (lbs) 2   Flexion PROM;5 reps;Strengthening;10 reps   Shoulder Flexion Weight (lbs) 2   ABduction PROM;5 reps;Strengthening;10 reps   Shoulder ABduction Weight (lbs) 2   Shoulder Exercises: Seated   Protraction Strengthening;10 reps   Protraction Weight (lbs) 1   Horizontal ABduction Strengthening;10 reps   Horizontal ABduction Weight (lbs) 1   External Rotation Strengthening;10 reps   External Rotation  Weight (lbs) 1   Internal Rotation Strengthening;10 reps   Internal Rotation Weight (lbs) 1   Flexion Strengthening;12 reps   Flexion Weight (lbs) 1   Abduction Strengthening;10 reps   ABduction Weight (lbs) 1   Shoulder Exercises: Sidelying   External Rotation Strengthening;10 reps   External Rotation Weight (lbs) 2   Internal Rotation Strengthening;10 reps   Internal Rotation Weight (lbs) 2   Flexion Strengthening;10 reps   Flexion Weight (lbs) 2   ABduction Strengthening;10 reps   ABduction Weight (lbs) 2   Shoulder Exercises: Standing   Extension Theraband;15 reps   Theraband Level (Shoulder Extension) Level 2 (Red)   Row  Theraband;15 reps   Theraband Level (Shoulder Row) Level 2 (Red)   Retraction Theraband;15 reps   Theraband Level (Shoulder Retraction) Level 2 (Red)   Manual Therapy   Manual Therapy Myofascial release   Manual therapy comments Manual therapy was completed prior to exercises this session.   Myofascial Release Myofascial release and manual stretching to left upper arm, trapezius, and scapularis to decrease fascial restrictions and increase joint mobility in a pain free zone.                 OT Education - 02/11/16 1030    Education provided Yes   Education Details Scapular Theraband HEP provided.   Person(s) Educated Patient   Methods Explanation;Demonstration;Tactile cues;Verbal cues;Handout   Comprehension Tactile cues required;Verbal cues required;Returned demonstration;Verbalized understanding          OT Short Term Goals - 02/11/16 1022    OT SHORT TERM GOAL #1   Title Pt will be educated on HEP.    Time 4   Period Weeks   Status Achieved   OT SHORT TERM GOAL #2   Title Pt will decrease pain to 2/10 or less while using RUE during functional tasks.    Time 4   Period Weeks   Status Revised  Upgraded   OT SHORT TERM GOAL #3   Title Pt will decrease fascial restrictions to trace amounts or less in the RUE to increase mobility necessary for daily tasks.    Time 4   Period Weeks   Status Revised  Upgraded   OT SHORT TERM GOAL #4   Title Pt will increase A/ROM to [facility] to increase ability to reach into overhead cabinets using right hand as dominant.    Time 4   Period Weeks   Status Achieved   OT SHORT TERM GOAL #5   Title Pt will increase strength to 5/5 to increase ability to lift 25 pound bags of bird feed using RUE as dominant.    Time 4   Period Weeks   Status Revised - upgraded abduction is still at 3/5                  Plan - 02/11/16 1048    Clinical Impression Statement A: Measurements taken today as patient has a follow up appointment  with MD on Monday. Patient's Active and Passive ROM is all WNL. Patient does continue to have decreased strength with shoulder abduction. Decreased shoulder and scapular stability noted during strengthening exercises.  Plan of care has been updated to focus on appropriate strengthening needed to complete daily tasks.    Plan P: Discontinue manual therapy, passive stretching, and supine exercises. Focus on sidelying flexion strengthning and seated exercises for strengthening.      Patient will benefit from skilled therapeutic intervention in order to improve the following deficits  and impairments:  Decreased strength, Pain, Impaired UE functional use, Decreased range of motion, Increased fascial restricitons, Impaired flexibility  Visit Diagnosis: Pain in left shoulder  Muscle weakness (generalized)    Problem List Patient Active Problem List   Diagnosis Date Noted  . Lumbosacral spondylosis without myelopathy 07/11/2013  . S/P lumbar fusion 07/11/2013  . Back pain, chronic 09/19/2012  . Leg weakness, bilateral 09/19/2012  . Difficulty in walking(719.7) 09/19/2012  . Tendonitis of shoulder, right 10/26/2011  . Vertigo 10/26/2011  . Pain in joint, shoulder region 10/26/2011  . Shoulder weakness 10/26/2011    Limmie PatriciaLaura Jessi Pitstick, OTR/L,CBIS  [phone]  02/11/2016, 11:37 AM  [facility] [address] [place], [place], [address] Phone: [phone]   Fax:  [phone]  Name: Michael [name] MRN: [id number] Date of Birth: 06/09/1941

## 2016-02-11 NOTE — Patient Instructions (Addendum)
(  Home) Extension: Isometric / Bilateral Arm Retraction - Sitting   Facing anchor, hold hands and elbow at shoulder height, with elbow bent.  Pull arms back to squeeze shoulder blades together. Repeat 10-15 times.  Copyright  VHI. All rights reserved.   (Home) Retraction: Row - Bilateral (Anchor)   Facing anchor, arms reaching forward, pull hands toward stomach, keeping elbows bent and at your sides and pinching shoulder blades together. Repeat 10-15 times.  Copyright  VHI. All rights reserved.   (Clinic) Extension / Flexion (Assist)   Face anchor, pull arms back, keeping elbow straight, and squeze shoulder blades together. Repeat 10-15 times.   Copyright  VHI. All rights reserved.     02/11/16 0950  Assessment  Diagnosis s/p debridement R shoulder   Precautions  Precautions None  AROM  Overall AROM Comments Assessed seated, ER/IR adducted  AROM Assessment Site Shoulder  Right/Left Shoulder Right  Overall AROM  Within functional limits for tasks performed  PROM  Overall PROM Comments Assessed supine, ER/IR adducted  PROM Assessment Site Shoulder  Right/Left Shoulder Right  Overall PROM  Within functional limits for tasks performed  Strength  Overall Strength Comments Assessed seated, ER/IR adducted  Strength Assessment Site Shoulder  Right/Left Shoulder Right  Right Shoulder Flexion 4+/5 (previous 4-/5)  Right Shoulder ABduction 3/5 (previous 3-/5)  Right Shoulder Internal Rotation 5/5 (previous 4-/5)  Right Shoulder External Rotation 4+/5 (previous 3+/5)   Plan: Discharge manual therapy, passive stretching, and supine strengthening. Focus on flexion strengthening in sidelying as well as seated strengthening. Overall focus on shoulder and scapular strengthening.    Herbert MoorsBriea Thao Bauza OTA student  Vernona RiegerLaura Essenmacher OTR/L, CBIS

## 2016-02-13 ENCOUNTER — Encounter (HOSPITAL_COMMUNITY): Payer: Medicare Other | Admitting: Occupational Therapy

## 2016-02-14 ENCOUNTER — Encounter (HOSPITAL_COMMUNITY): Payer: Self-pay

## 2016-02-14 ENCOUNTER — Ambulatory Visit (HOSPITAL_COMMUNITY): Payer: Medicare Other

## 2016-02-14 DIAGNOSIS — M25512 Pain in left shoulder: Secondary | ICD-10-CM | POA: Diagnosis not present

## 2016-02-14 DIAGNOSIS — R29898 Other symptoms and signs involving the musculoskeletal system: Secondary | ICD-10-CM

## 2016-02-14 NOTE — Therapy (Signed)
[facility] [address] , [place], [address] Phone: [phone]   Fax:  [phone]  Occupational Therapy Treatment  Patient Details  Name: Michael [name] MRN: [id number] Date of Birth: 04/07/1941 Referring Provider: Dr. Jones BroomJustin Chandler  Encounter Date: 02/14/2016      OT End of Session - 02/14/16 1142    Visit Number 7   Number of Visits 8   Date for OT Re-Evaluation 02/23/16   Authorization Type U[employer] Medicare   Authorization Time Period Before 10th visit   Authorization - Visit Number 7   Authorization - Number of Visits 10   OT Start Time [phone]   OT Stop Time 1030   OT Time Calculation (min) 40 min   Activity Tolerance Patient tolerated treatment well   Behavior During Therapy [facility] for tasks assessed/performed      Past Medical History  Diagnosis Date  . Hypertension   . Coronary artery disease     Past Surgical History  Procedure Laterality Date  . Colonoscopy  07/31/2011    Procedure: COLONOSCOPY;  Surgeon: Dalia HeadingMark A Jenkins;  [facility];  Service: Gastroenterology;  Laterality: N/A;    There were no vitals filed for this visit.      Subjective Assessment - 02/14/16 0951    Subjective  S: I saw the PA and he said to do therapy as long as I feel like I need to and then when I want to be done I can be done.   Currently in Pain? No/denies            [facility] - 02/14/16 [id number]    Assessment   Diagnosis s/p debridement R shoulder    Precautions   Precautions None                  OT Treatments/Exercises (OP) - 02/14/16 0952    Exercises   Exercises Shoulder   Shoulder Exercises: Prone   Other Prone Exercises Houghston exercises 10X   Other Prone Exercises scapular raises 10X   Shoulder Exercises: Sidelying   External Rotation Strengthening;12 reps   External Rotation Weight (lbs) 2   Internal Rotation Strengthening;12 reps   Internal Rotation Weight (lbs) 2   Flexion  Strengthening;12 reps   Flexion Weight (lbs) 2   ABduction Strengthening;12 reps   ABduction Weight (lbs) 2   Shoulder Exercises: Standing   Protraction Strengthening;12 reps   Protraction Weight (lbs) 2   Horizontal ABduction Strengthening;12 reps   Horizontal ABduction Weight (lbs) 2   External Rotation Strengthening;12 reps   External Rotation Weight (lbs) 2   Internal Rotation Strengthening;12 reps   Internal Rotation Weight (lbs) 2   Flexion Strengthening;12 reps   Shoulder Flexion Weight (lbs) 2   ABduction AROM;15 reps   Extension Theraband;12 reps   Theraband Level (Shoulder Extension) Level 4 (Blue)   Row Theraband;12 reps   Theraband Level (Shoulder Row) Level 4 (Blue)   Retraction Theraband;12 reps   Theraband Level (Shoulder Retraction) Level 4 (Blue)   Shoulder Exercises: ROM/Strengthening   UBE (Upper Arm Bike) Level 2 2' reverse   Cybex Press 3 plate;15 reps  4 plate 27O12X   Cybex Row 3.5 plate  53G12X   "W" Arms 12X   X to V Arms 12X   Other ROM/Strengthening Exercises arms on fire 1 min 45'   Shoulder Exercises: Body Blade   Flexion 30 seconds  good form for first 5" with elbow at 90  ABduction 30 seconds  good form for first 5" shoulder at 90 degrees aBduction                  OT Short Term Goals - 02/11/16 1022    OT SHORT TERM GOAL #1   Title Pt will be educated on HEP.    Time 4   Period Weeks   Status    OT SHORT TERM GOAL #2   Title Pt will decrease pain to 2/10 or less while using RUE during functional tasks.    Time 4   Period Weeks   Status On-Going   OT SHORT TERM GOAL #3   Title Pt will decrease fascial restrictions to trace amounts or less in the RUE to increase mobility necessary for daily tasks.    Time 4   Period Weeks   Status On-Going   OT SHORT TERM GOAL #4   Title Pt will increase A/ROM to [facility] to increase ability to reach into overhead cabinets using right hand as dominant.    Time 4   Period Weeks   Status On-Going    OT SHORT TERM GOAL #5   Title Pt will increase strength to 5/5 to increase ability to lift 25 pound bags of bird feed using RUE as dominant.    Time 4   Period Weeks   Status On-Going                  Plan - 02/14/16 1143    Clinical Impression Statement A: Completed [place] exercises in prone with mod verbal, visual, and tactile cueing from therapist for form and technique. Completed standing abduction without weight due to pain. Increased Theraband to blue and added Cybex row and press.   Plan P: Complete Cybex row and press with 3.5 plates. Continue with exercises added today to focus on strengthening the deltoid, supraspinatus, trapezious, and serratus anterior muscles. Continue to work on decreasing pain with abduction. Focus on proper form and technique during exercises.  Complete next two visits and then discharge with HEP.      Patient will benefit from skilled therapeutic intervention in order to improve the following deficits and impairments:  Decreased strength, Pain, Impaired UE functional use, Decreased range of motion, Increased fascial restricitons, Impaired flexibility  Visit Diagnosis: No diagnosis found.    Problem List Patient Active Problem List   Diagnosis Date Noted  . Lumbosacral spondylosis without myelopathy 07/11/2013  . S/P lumbar fusion 07/11/2013  . Back pain, chronic 09/19/2012  . Leg weakness, bilateral 09/19/2012  . Difficulty in walking(719.7) 09/19/2012  . Tendonitis of shoulder, right 10/26/2011  . Vertigo 10/26/2011  . Pain in joint, shoulder region 10/26/2011  . Shoulder weakness 10/26/2011    Herbert Moors OTA student 02/14/2016, 11:48 AM  [facility] [address] [place], [place], [address] Phone: [phone]   Fax:  [phone]  Name: Michael [name] MRN: [id number] Date of Birth: Feb 25, 1941 This entire session was guided, instructed, and directly supervised by Limmie Patricia, OTR/L, CBIS.  Limmie Patricia, OTR/L,CBIS  [phone]

## 2016-02-18 ENCOUNTER — Encounter (HOSPITAL_COMMUNITY): Payer: Self-pay | Admitting: Occupational Therapy

## 2016-02-18 ENCOUNTER — Ambulatory Visit (HOSPITAL_COMMUNITY): Payer: Medicare Other | Admitting: Occupational Therapy

## 2016-02-18 DIAGNOSIS — M25512 Pain in left shoulder: Secondary | ICD-10-CM | POA: Diagnosis not present

## 2016-02-18 DIAGNOSIS — R29898 Other symptoms and signs involving the musculoskeletal system: Secondary | ICD-10-CM

## 2016-02-18 DIAGNOSIS — M6281 Muscle weakness (generalized): Secondary | ICD-10-CM

## 2016-02-18 NOTE — Therapy (Signed)
[facility] [address] [place], [place], [address] Phone: [phone]   Fax:  [phone]  Occupational Therapy Treatment  Patient Details  Name: Michael [name] MRN: [id number] Date of Birth: 07/06/1941 Referring Provider: Dr. Jones BroomJustin Chandler  Encounter Date: 02/18/2016      OT End of Session - 02/18/16 1119    Visit Number 8   Number of Visits 9   Date for OT Re-Evaluation 02/23/16   Authorization Type U[employer] Medicare   Authorization Time Period Before 10th visit   Authorization - Visit Number 8   Authorization - Number of Visits 10   OT Start Time 1032   OT Stop Time 1114   OT Time Calculation (min) 42 min   Activity Tolerance Patient tolerated treatment well   Behavior During Therapy [facility] for tasks assessed/performed      Past Medical History  Diagnosis Date  . Hypertension   . Coronary artery disease     Past Surgical History  Procedure Laterality Date  . Colonoscopy  07/31/2011    Procedure: COLONOSCOPY;  Surgeon: Dalia HeadingMark A Jenkins;  [facility];  Service: Gastroenterology;  Laterality: N/A;    There were no vitals filed for this visit.      Subjective Assessment - 02/18/16 1036    Subjective  S: I'm sore after last time, they really worked me.   Currently in Pain? Yes   Pain Score 4    Pain Location Shoulder   Pain Orientation Right   Pain Descriptors / Indicators Sore   Pain Type Acute pain   Pain Radiating Towards none   Pain Onset 1 to 4 weeks ago   Pain Frequency Intermittent   Aggravating Factors  certain movements   Pain Relieving Factors rest    Effect of Pain on Daily Activities difficulty using RUE with daily tasks   Multiple Pain Sites No            [facility] OT Assessment - 02/18/16 1037    Assessment   Diagnosis s/p debridement R shoulder    Precautions   Precautions None                  OT Treatments/Exercises (OP) - 02/18/16 1037    Exercises   Exercises Shoulder    Shoulder Exercises: Prone   Other Prone Exercises [place] exercises 10X   Other Prone Exercises scapular raises 10X   Shoulder Exercises: Sidelying   External Rotation Strengthening;12 reps   External Rotation Weight (lbs) 2   Internal Rotation Strengthening;12 reps   Internal Rotation Weight (lbs) 2   Flexion Strengthening;12 reps   Flexion Weight (lbs) 2   ABduction Strengthening;12 reps   ABduction Weight (lbs) 2   Shoulder Exercises: Standing   Protraction Strengthening;12 reps   Protraction Weight (lbs) 2   Horizontal ABduction Strengthening;12 reps   Horizontal ABduction Weight (lbs) 1   External Rotation Strengthening;12 reps   External Rotation Weight (lbs) 2   Internal Rotation Strengthening;12 reps   Internal Rotation Weight (lbs) 2   Flexion Strengthening;12 reps   Shoulder Flexion Weight (lbs) 2   ABduction Strengthening;12 reps   Shoulder ABduction Weight (lbs) 1   Extension Theraband;15 reps   Theraband Level (Shoulder Extension) Level 4 (Blue)   Row Theraband;15 reps   Theraband Level (Shoulder Row) Level 4 (Blue)   Retraction Theraband;15 reps   Theraband Level (Shoulder Retraction) Level 4 (Blue)   Shoulder Exercises: ROM/Strengthening   UBE (Upper Arm  Bike) Level 2 3' forward 3' reverse   Cybex Press 3.5 plate;15 reps   Cybex Row 3.5 plate;15 reps   "W" Arms 12X   X to V Arms 10X with 1# weight   Other ROM/Strengthening Exercises arms on fire 1 min 45'   Shoulder Exercises: Body Blade   Flexion 30 seconds  good form with elbow flexed to 90   ABduction 30 seconds  good form for 10" elbow flexed to 90                  OT Short Term Goals - 02/14/16 1258    OT SHORT TERM GOAL #1   Title Pt will be educated on HEP.    Time 4   Period Weeks   OT SHORT TERM GOAL #2   Title Pt will decrease pain to 2/10 or less while using RUE during functional tasks.    Time 4   Period Weeks   Status On-going   OT SHORT TERM GOAL #3   Title Pt will  decrease fascial restrictions to trace amounts or less in the RUE to increase mobility necessary for daily tasks.    Time 4   Period Weeks   Status On-going   OT SHORT TERM GOAL #4   Title Pt will increase A/ROM to [facility] to increase ability to reach into overhead cabinets using right hand as dominant.    Time 4   Period Weeks   OT SHORT TERM GOAL #5   Title Pt will increase strength to 5/5 to increase ability to lift 25 pound bags of bird feed using RUE as dominant.    Time 4   Period Weeks   Status On-going                  Plan - 02/18/16 1119    Clinical Impression Statement A: Cybex row and press completed with 3.5# plate, verbal cuing for speed and form. Continued with strengthening exercises, added 1# to abduction and x to v arms during standing exercises. Pt completed exercises with verbal and occasional tactile cuing for form, continues to have difficulty with abduction exercises due to pain/soreness.    Rehab Potential Good   OT Frequency 2x / week   OT Duration 4 weeks   OT Treatment/Interventions Self-care/ADL training;Passive range of motion;Patient/family education;Cryotherapy;Electrical Stimulation;Moist Heat;Therapeutic exercise;Manual Therapy;Therapeutic activities   Plan P: Reassess & discharge with HEP.    Consulted and Agree with Plan of Care Patient      Patient will benefit from skilled therapeutic intervention in order to improve the following deficits and impairments:  Decreased strength, Pain, Impaired UE functional use, Decreased range of motion, Increased fascial restricitons, Impaired flexibility  Visit Diagnosis: Other symptoms and signs involving the musculoskeletal system  Pain in left shoulder  Muscle weakness (generalized)    Problem List Patient Active Problem List   Diagnosis Date Noted  . Lumbosacral spondylosis without myelopathy 07/11/2013  . S/P lumbar fusion 07/11/2013  . Back pain, chronic 09/19/2012  . Leg weakness,  bilateral 09/19/2012  . Difficulty in walking(719.7) 09/19/2012  . Tendonitis of shoulder, right 10/26/2011  . Vertigo 10/26/2011  . Pain in joint, shoulder region 10/26/2011  . Shoulder weakness 10/26/2011    Ezra Sites, OTR/L  [phone]  02/18/2016, 11:21 AM  [facility] [address] [place], [place], [address] Phone: [phone]   Fax:  [phone]  Name: Michael [name] MRN: [id number] Date of Birth: [birth year]

## 2016-02-20 ENCOUNTER — Ambulatory Visit (HOSPITAL_COMMUNITY): Payer: Medicare Other | Admitting: Occupational Therapy

## 2016-02-20 ENCOUNTER — Encounter (HOSPITAL_COMMUNITY): Payer: Self-pay | Admitting: Occupational Therapy

## 2016-02-20 DIAGNOSIS — M25512 Pain in left shoulder: Secondary | ICD-10-CM

## 2016-02-20 DIAGNOSIS — R29898 Other symptoms and signs involving the musculoskeletal system: Secondary | ICD-10-CM

## 2016-02-20 NOTE — Therapy (Signed)
[place], [place], [address] Phone: [phone]   Fax:  [phone]  Occupational Therapy Reassessment, Treatment, Discharge Summary  Patient Details  Name: Michael [name] MRN: [id number] Date of Birth: [birth year] Referring Provider: Dr. Tania Ade  Encounter Date: 02/20/2016      OT End of Session - 02/20/16 1208    Visit Number 9   Number of Visits 9   Date for OT Re-Evaluation 02/23/16   Authorization Type UHC Medicare   Authorization Time Period Before 10th visit   Authorization - Visit Number 9   Authorization - Number of Visits 10   OT Start Time 1032   OT Stop Time 1111   OT Time Calculation (min) 39 min   Activity Tolerance Patient tolerated treatment well   Behavior During Therapy [facility] for tasks assessed/performed      Past Medical History  Diagnosis Date  . Hypertension   . Coronary artery disease     Past Surgical History  Procedure Laterality Date  . Colonoscopy  07/31/2011    Procedure: COLONOSCOPY;  Surgeon: Jamesetta So;  [facility];  Service: Gastroenterology;  Laterality: N/A;    There were no vitals filed for this visit.      Subjective Assessment - 02/20/16 1205    Subjective  S: It's sore but no worse than last time.    Special Tests 69/100 (31% impairment)   Currently in Pain? Yes   Pain Score 4    Pain Location Shoulder   Pain Orientation Right   Pain Descriptors / Indicators Sore   Pain Type Acute pain   Pain Radiating Towards none   Pain Onset 1 to 4 weeks ago   Pain Frequency Intermittent   Aggravating Factors  certain movements-abduction, behind back   Pain Relieving Factors rest    Effect of Pain on Daily Activities difficulty using RUE during daily tasks   Multiple Pain Sites No           [facility] OT Assessment - 02/20/16 1033    Assessment   Diagnosis s/p debridement R shoulder    Precautions   Precautions None   AROM   Overall AROM   Within functional limits for tasks performed   Overall AROM Comments Assessed seated, ER/IR adducted   AROM Assessment Site Shoulder   Right/Left Shoulder Right   PROM   Overall PROM  Within functional limits for tasks performed   Overall PROM Comments Assessed supine, ER/IR adducted   PROM Assessment Site Shoulder   Right/Left Shoulder Right   Strength   Overall Strength Comments Assessed seated, ER/IR adducted   Strength Assessment Site Shoulder   Right/Left Shoulder Right   Right Shoulder Flexion 4+/5  same as previous   Right Shoulder ABduction 4/5  3/5 previous   Right Shoulder Internal Rotation 5/5  same as previous   Right Shoulder External Rotation 4+/5  same as previous                  OT Treatments/Exercises (OP) - 02/20/16 1047    Exercises   Exercises Shoulder   Shoulder Exercises: Sidelying   External Rotation Strengthening;12 reps   External Rotation Weight (lbs) 2   Internal Rotation Strengthening;12 reps   Internal Rotation Weight (lbs) 2   Flexion Strengthening;12 reps   Flexion Weight (lbs) 2   ABduction Strengthening;12 reps   ABduction Weight (lbs) 2   Shoulder Exercises: Standing   Protraction Strengthening;12  reps   Protraction Weight (lbs) 2   Horizontal ABduction Strengthening;12 reps   Horizontal ABduction Weight (lbs) 1   External Rotation Strengthening;12 reps   External Rotation Weight (lbs) 2   Internal Rotation Strengthening;12 reps   Internal Rotation Weight (lbs) 2   Flexion Strengthening;12 reps   Shoulder Flexion Weight (lbs) 2   ABduction Strengthening;12 reps   Shoulder ABduction Weight (lbs) 1   Other Standing Exercises Green theraband exercises: horizontal abduction, ER/IR, PNF patterns. 10X each   Shoulder Exercises: ROM/Strengthening   UBE (Upper Arm Bike) Level 2 3' forward 3' reverse   "W" Arms 12X   X to V Arms 10X with 1# weight   Prot/Ret//Elev/Dep 1' flexion, 1' abduction   Shoulder Exercises: Body Blade    Flexion 30 seconds  good form with elbow flexed to 90   ABduction 30 seconds  good form for 10", elbow flexed to 90               OT Education - 02/20/16 1208    Education provided Yes   Education Details green theraband strengthening exercises; reviewed current HEP, instructed pt to add 1 to 2 pound weights to A/ROM exercises   Person(s) Educated Patient   Methods Explanation;Demonstration;Handout   Comprehension Verbalized understanding;Returned demonstration          OT Short Term Goals - 02/20/16 1212    OT SHORT TERM GOAL #1   Title Pt will be educated on HEP.    Time 4   Period Weeks   OT SHORT TERM GOAL #2   Title Pt will decrease pain to 2/10 or less while using RUE during functional tasks.    Time 4   Period Weeks   Status Not Met   OT SHORT TERM GOAL #3   Title Pt will decrease fascial restrictions to trace amounts or less in the RUE to increase mobility necessary for daily tasks.    Time 4   Period Weeks   Status Achieved   OT SHORT TERM GOAL #4   Title Pt will increase A/ROM to [facility] to increase ability to reach into overhead cabinets using right hand as dominant.    Time 4   Period Weeks   OT SHORT TERM GOAL #5   Title Pt will increase strength to 5/5 to increase ability to lift 25 pound bags of bird feed using RUE as dominant.    Time 4   Period Weeks   Status Partially Met                  Plan - 02/20/16 1211    Clinical Impression Statement A: Reassessment completed this date, pt has met 3/5 STGs and partially met an additional 1/5 STGs. Pt demonstrates improvements in A/ROM, strength, and pain levels, reporting he is able to complete most daily tasks with minimal difficulty now. He continues to have pain during daily tasks, however reports it is not as severe as prior to therapy. Pt continues to demonstrate deficits in strength of the RUE. Pt was provided with green theraband HEP for RUE strengtheing, reviewed current HEP with pt and  instructed to begin using 1 to 2 pound weights. Pt is agreeable to discharge.    Rehab Potential Good   OT Frequency 2x / week   OT Duration 4 weeks   OT Treatment/Interventions Self-care/ADL training;Passive range of motion;Patient/family education;Cryotherapy;Electrical Stimulation;Moist Heat;Therapeutic exercise;Manual Therapy;Therapeutic activities   Plan P: Discharge pt.    OT Home Exercise Plan  green strengthening theraband   Consulted and Agree with Plan of Care Patient      Patient will benefit from skilled therapeutic intervention in order to improve the following deficits and impairments:  Decreased strength, Pain, Impaired UE functional use, Decreased range of motion, Increased fascial restricitons, Impaired flexibility  Visit Diagnosis: Other symptoms and signs involving the musculoskeletal system  Pain in left shoulder    Problem List Patient Active Problem List   Diagnosis Date Noted  . Lumbosacral spondylosis without myelopathy 07/11/2013  . S/P lumbar fusion 07/11/2013  . Back pain, chronic 09/19/2012  . Leg weakness, bilateral 09/19/2012  . Difficulty in walking(719.7) 09/19/2012  . Tendonitis of shoulder, right 10/26/2011  . Vertigo 10/26/2011  . Pain in joint, shoulder region 10/26/2011  . Shoulder weakness 10/26/2011    Guadelupe Sabin, OTR/L  [phone]  02/20/2016, 2:53 PM  [place] [address] [place], [place], [address] Phone: [phone]   Fax:  [phone]  Name: Michael [name] MRN: [id number] Date of Birth: 16-Mar-1941  OCCUPATIONAL THERAPY DISCHARGE SUMMARY  Visits from Start of Care: 9  Current functional level related to goals / functional outcomes: See above. Pt is now able to complete ADL tasks, including overhead tasks, with min difficulty. Pt has made gains in A/ROM, strength, and functional use of RUE.    Remaining deficits: Pt continues to experience increased pain in RUE during  daily tasks, demonstrates deficits in strength in RUE limiting ability to complete heavy work tasks or lifting tasks.    Education / Equipment: Pt HEP updated, provided with green theraband strengthening exercises, instructed to begin adding 1-2 pound weights to A/ROM exercises.  Plan: Patient agrees to discharge.  Patient goals were partially met. Patient is being discharged due to being pleased with the current functional level.  ?????

## 2016-02-20 NOTE — Patient Instructions (Signed)
Strengthening: Chest Pull - Resisted   Hold Theraband in front of body with hands about shoulder width a part. Pull band a part and back together slowly. Repeat _10-15___ times. Repeat _1-2___ session(s) per day.  http://orth.exer.us/926   Copyright  VHI. All rights reserved.   PNF Strengthening: Resisted   Standing with resistive band around each hand, bring right arm up and away, thumb back. Repeat _10-15___ times per set.  Do _1-2___ sessions per day.                           Resisted External Rotation: in Neutral - Bilateral   Sit or stand, tubing in both hands, elbows at sides, bent to 90, forearms forward. Pinch shoulder blades together and rotate forearms out. Keep elbows at sides. Repeat _10-15___ times per set.  Do _1-2___ sessions per day.  http://orth.exer.us/966   Copyright  VHI. All rights reserved.   PNF Strengthening: Resisted   Standing, hold resistive band above head. Bring right arm down and out from side. Repeat _10-15___ times per set.  Do _1-2___ sessions per day.  http://orth.exer.us/922   Copyright  VHI. All rights reserved.  

## 2016-03-30 ENCOUNTER — Other Ambulatory Visit: Payer: Self-pay | Admitting: Orthopedic Surgery

## 2016-04-08 ENCOUNTER — Ambulatory Visit (HOSPITAL_COMMUNITY)
Admission: RE | Admit: 2016-04-08 | Discharge: 2016-04-08 | Disposition: A | Discharge status: Home / Self Care | Payer: Medicare Other | Source: Ambulatory Visit | Attending: Orthopedic Surgery | Admitting: Orthopedic Surgery

## 2016-04-08 ENCOUNTER — Encounter (HOSPITAL_COMMUNITY)
Admission: RE | Admit: 2016-04-08 | Discharge: 2016-04-08 | Disposition: A | Discharge status: Home / Self Care | Payer: Medicare Other | Source: Ambulatory Visit | Attending: Orthopedic Surgery | Admitting: Orthopedic Surgery

## 2016-04-08 ENCOUNTER — Encounter (HOSPITAL_COMMUNITY): Payer: Self-pay

## 2016-04-08 DIAGNOSIS — Z01818 Encounter for other preprocedural examination: Secondary | ICD-10-CM

## 2016-04-08 DIAGNOSIS — Z01812 Encounter for preprocedural laboratory examination: Secondary | ICD-10-CM | POA: Diagnosis not present

## 2016-04-08 HISTORY — DX: Hyperlipidemia, unspecified: E78.5

## 2016-04-08 HISTORY — DX: Other seasonal allergic rhinitis: J30.2

## 2016-04-08 LAB — URINALYSIS, ROUTINE W REFLEX MICROSCOPIC
Bilirubin Urine: NEGATIVE
GLUCOSE, UA: NEGATIVE mg/dL
Ketones, ur: NEGATIVE mg/dL
LEUKOCYTES UA: NEGATIVE
Nitrite: NEGATIVE
PH: 6.5 (ref 5.0–8.0)
PROTEIN: NEGATIVE mg/dL
SPECIFIC GRAVITY, URINE: 1.011 (ref 1.005–1.030)

## 2016-04-08 LAB — COMPREHENSIVE METABOLIC PANEL
ALBUMIN: 4.5 g/dL (ref 3.5–5.0)
ALT: 11 U/L — AB (ref 17–63)
AST: 20 U/L (ref 15–41)
Alkaline Phosphatase: 44 U/L (ref 38–126)
Anion gap: 10 (ref 5–15)
BUN: 7 mg/dL (ref 6–20)
CHLORIDE: 95 mmol/L — AB (ref 101–111)
CO2: 29 mmol/L (ref 22–32)
CREATININE: 0.9 mg/dL (ref 0.61–1.24)
Calcium: 9.8 mg/dL (ref 8.9–10.3)
GFR calc Af Amer: >60 mL/min (ref >60)
GFR calc non Af Amer: >60 mL/min (ref >60)
GLUCOSE: 103 mg/dL — AB (ref 65–99)
Potassium: 3.8 mmol/L (ref 3.5–5.1)
SODIUM: 134 mmol/L — AB (ref 135–145)
Total Bilirubin: 1.2 mg/dL (ref 0.3–1.2)
Total Protein: 6.6 g/dL (ref 6.5–8.1)

## 2016-04-08 LAB — CBC WITH DIFFERENTIAL/PLATELET
BASOS ABS: 0.1 10*3/uL (ref 0.0–0.1)
Basophils Relative: 2 %
EOS ABS: 0.1 10*3/uL (ref 0.0–0.7)
EOS PCT: 2 %
HCT: 45.3 % (ref 39.0–52.0)
HEMOGLOBIN: 14.9 g/dL (ref 13.0–17.0)
Lymphocytes Relative: 30 %
Lymphs Abs: 1.6 10*3/uL (ref 0.7–4.0)
MCH: 28.4 pg (ref 26.0–34.0)
MCHC: 32.9 g/dL (ref 30.0–36.0)
MCV: 86.3 fL (ref 78.0–100.0)
Monocytes Absolute: 0.4 10*3/uL (ref 0.1–1.0)
Monocytes Relative: 8 %
NEUTROS PCT: 58 %
Neutro Abs: 3 10*3/uL (ref 1.7–7.7)
PLATELETS: 233 10*3/uL (ref 150–400)
RBC: 5.25 MIL/uL (ref 4.22–5.81)
RDW: 13.7 % (ref 11.5–15.5)
WBC: 5.2 10*3/uL (ref 4.0–10.5)

## 2016-04-08 LAB — SURGICAL PCR SCREEN
MRSA, PCR: NEGATIVE
Staphylococcus aureus: POSITIVE — AB

## 2016-04-08 LAB — URINE MICROSCOPIC-ADD ON
BACTERIA UA: NONE SEEN
RBC / HPF: NONE SEEN RBC/hpf (ref 0–5)
WBC, UA: NONE SEEN WBC/hpf (ref 0–5)

## 2016-04-08 LAB — APTT: APTT: 31 s (ref 24–37)

## 2016-04-08 LAB — PROTIME-INR
INR: 1.03 (ref 0.00–1.49)
Prothrombin Time: 13.7 seconds (ref 11.6–15.2)

## 2016-04-08 NOTE — Progress Notes (Signed)
Pt. Notified of results of PCR. RX. [facility] [address] [place].

## 2016-04-08 NOTE — Pre-Procedure Instructions (Signed)
    Michael [name]  04/08/2016      [id number] - [place] - [id number] [place] [id number] Phone: [phone] Fax: [phone]    Your procedure is scheduled on 04-16-2016   Thursday  .  [facility].M.   Call this number if you have problems the morning of surgery:  [phone]   Remember:  Do not eat food or drink liquids after midnight.   Take these medicines the morning of surgery with A SIP OF WATER fexofenadine(allegra),Advair(Inhaler),rosuvastatin(Crestor)                           STOP ASPIRIN,ANTIINFLAMATORIES (IBUPROFEN,ALEVE,MOTRIN,ADVIL,GOODY'S POWDERS),HERBAL SUPPLEMENTS,FISH OIL,AND VITAMINS 5-7 DAYS PRIOR TO SURGERY   Do not wear jewelry  Do not wear lotions, powders, or perfumes.  You may NOT wear deoderant.   Do not shave 48 hours prior to surgery.  Men may shave face and neck.   Do not bring valuables to the hospital.  [facility] is not responsible for any belongings or valuables.  Contacts, dentures or bridgework may not be worn into surgery.  Leave your suitcase in the car.  After surgery it may be brought to your room.  For patients admitted to the hospital, discharge time will be determined by your treatment team.  Patients discharged the day of surgery will not be allowed to drive home.    Special instructions:  See attached Sheet for instructions on CHG showers.  Please read over the following fact sheets that you were given. Surgical Site Infection Prevention,Incentive Spirometry,

## 2016-04-08 NOTE — Progress Notes (Signed)
EKG requested from Dr. Carylon Perchesoy Fagan in [place].  Pt. Does not see a cardiologist,denies any cardiac symptoms,no chest pain,discomfort, or SOB.

## 2016-04-15 MED ORDER — CEFAZOLIN SODIUM-DEXTROSE 2-4 GM/100ML-% IV SOLN
2.0000 g | INTRAVENOUS | Status: AC
  Administered 2016-04-16: 2 g via INTRAVENOUS
  Filled 2016-04-15: qty 100

## 2016-04-15 NOTE — Anesthesia Preprocedure Evaluation (Addendum)
Anesthesia Evaluation  Patient identified by MRN, date of birth, ID band Patient awake    Reviewed: Allergy & Precautions, NPO status , Patient's Chart, lab work & pertinent test results  Airway Mallampati: II  TM Distance: >3 FB Neck ROM: Full    Dental no notable dental hx.    Pulmonary neg pulmonary ROS, former smoker,    Pulmonary exam normal breath sounds clear to auscultation       Cardiovascular hypertension, Pt. on medications + CAD  Normal cardiovascular exam Rhythm:Regular Rate:Normal     Neuro/Psych negative neurological ROS  negative psych ROS   GI/Hepatic negative GI ROS, Neg liver ROS,   Endo/Other  negative endocrine ROS  Renal/GU negative Renal ROS     Musculoskeletal  (+) Arthritis ,   Abdominal   Peds  Hematology negative hematology ROS (+)   Anesthesia Other Findings   Reproductive/Obstetrics negative OB ROS                             Anesthesia Physical Anesthesia Plan  ASA: II  Anesthesia Plan: General and Regional   Post-op Pain Management: GA combined w/ Regional for post-op pain   Induction: Intravenous  Airway Management Planned: Oral ETT  Additional Equipment:   Intra-op Plan:   Post-operative Plan: Extubation in OR  Informed Consent: I have reviewed the patients History and Physical, chart, labs and discussed the procedure including the risks, benefits and alternatives for the proposed anesthesia with the patient or authorized representative who has indicated his/her understanding and acceptance.   Dental advisory given  Plan Discussed with: CRNA  Anesthesia Plan Comments:         Anesthesia Quick Evaluation

## 2016-04-16 ENCOUNTER — Encounter (HOSPITAL_COMMUNITY): Payer: Self-pay | Admitting: *Deleted

## 2016-04-16 ENCOUNTER — Encounter (HOSPITAL_COMMUNITY)
Admission: RE | Disposition: A | Discharge status: Home / Self Care | Payer: Self-pay | Source: Ambulatory Visit | Attending: Orthopedic Surgery

## 2016-04-16 ENCOUNTER — Inpatient Hospital Stay (HOSPITAL_COMMUNITY): Payer: Medicare Other | Admitting: Anesthesiology

## 2016-04-16 ENCOUNTER — Inpatient Hospital Stay (HOSPITAL_COMMUNITY)
Admission: RE | Admit: 2016-04-16 | Discharge: 2016-04-17 | DRG: 483 | Disposition: A | Discharge status: Home / Self Care | Payer: Medicare Other | Source: Ambulatory Visit | Attending: Orthopedic Surgery | Admitting: Orthopedic Surgery

## 2016-04-16 ENCOUNTER — Inpatient Hospital Stay (HOSPITAL_COMMUNITY): Payer: Medicare Other

## 2016-04-16 DIAGNOSIS — I2510 Atherosclerotic heart disease of native coronary artery without angina pectoris: Secondary | ICD-10-CM | POA: Diagnosis present

## 2016-04-16 DIAGNOSIS — Z96619 Presence of unspecified artificial shoulder joint: Secondary | ICD-10-CM

## 2016-04-16 DIAGNOSIS — M75101 Unspecified rotator cuff tear or rupture of right shoulder, not specified as traumatic: Principal | ICD-10-CM | POA: Diagnosis present

## 2016-04-16 DIAGNOSIS — E785 Hyperlipidemia, unspecified: Secondary | ICD-10-CM | POA: Diagnosis present

## 2016-04-16 DIAGNOSIS — I10 Essential (primary) hypertension: Secondary | ICD-10-CM | POA: Diagnosis present

## 2016-04-16 DIAGNOSIS — J302 Other seasonal allergic rhinitis: Secondary | ICD-10-CM | POA: Diagnosis present

## 2016-04-16 DIAGNOSIS — Z96611 Presence of right artificial shoulder joint: Secondary | ICD-10-CM

## 2016-04-16 DIAGNOSIS — Z79899 Other long term (current) drug therapy: Secondary | ICD-10-CM | POA: Diagnosis not present

## 2016-04-16 DIAGNOSIS — Z87891 Personal history of nicotine dependence: Secondary | ICD-10-CM | POA: Diagnosis not present

## 2016-04-16 HISTORY — PX: REVERSE SHOULDER ARTHROPLASTY: SHX5054

## 2016-04-16 SURGERY — ARTHROPLASTY, SHOULDER, TOTAL, REVERSE
Anesthesia: Regional | Site: Shoulder | Laterality: Right

## 2016-04-16 MED ORDER — BUPIVACAINE-EPINEPHRINE (PF) 0.5% -1:200000 IJ SOLN
INTRAMUSCULAR | Status: DC | PRN
  Administered 2016-04-16: 30 mL via PERINEURAL

## 2016-04-16 MED ORDER — VALSARTAN-HYDROCHLOROTHIAZIDE 160-12.5 MG PO TABS: 1.0000 | ORAL_TABLET | Freq: Every day | ORAL | Status: DC

## 2016-04-16 MED ORDER — ESMOLOL HCL 100 MG/10ML IV SOLN
INTRAVENOUS | Status: DC | PRN
  Administered 2016-04-16: 20 mg via INTRAVENOUS

## 2016-04-16 MED ORDER — ASPIRIN EC 81 MG PO TBEC
81.0000 mg | DELAYED_RELEASE_TABLET | Freq: Two times a day (BID) | ORAL | Status: DC
  Administered 2016-04-16 – 2016-04-17 (×2): 81 mg via ORAL
  Filled 2016-04-16 (×2): qty 1

## 2016-04-16 MED ORDER — POLYETHYLENE GLYCOL 3350 17 G PO PACK
17.0000 g | PACK | Freq: Every day | ORAL | Status: DC | PRN
Start: 2016-04-16 — End: 2016-04-17

## 2016-04-16 MED ORDER — ACETAMINOPHEN 500 MG PO TABS
1000.0000 mg | ORAL_TABLET | Freq: Four times a day (QID) | ORAL | Status: AC
  Administered 2016-04-16 – 2016-04-17 (×4): 1000 mg via ORAL
  Filled 2016-04-16 (×4): qty 2

## 2016-04-16 MED ORDER — SUGAMMADEX SODIUM 200 MG/2ML IV SOLN
INTRAVENOUS | Status: DC | PRN
  Administered 2016-04-16: 150 mg via INTRAVENOUS

## 2016-04-16 MED ORDER — LIDOCAINE HCL (CARDIAC) 20 MG/ML IV SOLN
INTRAVENOUS | Status: DC | PRN
  Administered 2016-04-16: 100 mg via INTRAVENOUS

## 2016-04-16 MED ORDER — PROPOFOL 10 MG/ML IV BOLUS
INTRAVENOUS | Status: AC
  Filled 2016-04-16: qty 20

## 2016-04-16 MED ORDER — METOCLOPRAMIDE HCL 5 MG PO TABS: 5.0000 mg | ORAL_TABLET | Freq: Three times a day (TID) | ORAL | Status: DC | PRN

## 2016-04-16 MED ORDER — BISACODYL 5 MG PO TBEC
5.0000 mg | DELAYED_RELEASE_TABLET | Freq: Every day | ORAL | Status: DC | PRN
Start: 2016-04-16 — End: 2016-04-17

## 2016-04-16 MED ORDER — METOCLOPRAMIDE HCL 5 MG/ML IJ SOLN: 5.0000 mg | Freq: Three times a day (TID) | INTRAMUSCULAR | Status: DC | PRN

## 2016-04-16 MED ORDER — PROPOFOL 10 MG/ML IV BOLUS
INTRAVENOUS | Status: DC | PRN
  Administered 2016-04-16: 110 mg via INTRAVENOUS

## 2016-04-16 MED ORDER — PROMETHAZINE HCL 25 MG/ML IJ SOLN: 6.2500 mg | INTRAMUSCULAR | Status: DC | PRN

## 2016-04-16 MED ORDER — DEXTROSE 5 % IV SOLN
10.0000 mg | INTRAVENOUS | Status: DC | PRN
  Administered 2016-04-16: 50 ug/min via INTRAVENOUS

## 2016-04-16 MED ORDER — ONDANSETRON HCL 4 MG PO TABS: 4.0000 mg | ORAL_TABLET | Freq: Four times a day (QID) | ORAL | Status: DC | PRN

## 2016-04-16 MED ORDER — ACETAMINOPHEN 650 MG RE SUPP: 650.0000 mg | Freq: Four times a day (QID) | RECTAL | Status: DC | PRN

## 2016-04-16 MED ORDER — ONDANSETRON HCL 4 MG/2ML IJ SOLN: 4.0000 mg | Freq: Four times a day (QID) | INTRAMUSCULAR | Status: DC | PRN

## 2016-04-16 MED ORDER — ONDANSETRON HCL 4 MG/2ML IJ SOLN
INTRAMUSCULAR | Status: AC
  Filled 2016-04-16: qty 2

## 2016-04-16 MED ORDER — LORATADINE 10 MG PO TABS
10.0000 mg | ORAL_TABLET | Freq: Every day | ORAL | Status: DC
  Administered 2016-04-17: 10 mg via ORAL
  Filled 2016-04-16: qty 1

## 2016-04-16 MED ORDER — DIPHENHYDRAMINE HCL 12.5 MG/5ML PO ELIX: 12.5000 mg | ORAL_SOLUTION | ORAL | Status: DC | PRN

## 2016-04-16 MED ORDER — FENTANYL CITRATE (PF) 100 MCG/2ML IJ SOLN
INTRAMUSCULAR | Status: DC | PRN
  Administered 2016-04-16 (×2): 50 ug via INTRAVENOUS

## 2016-04-16 MED ORDER — CEFAZOLIN IN D5W 1 GM/50ML IV SOLN
1.0000 g | Freq: Four times a day (QID) | INTRAVENOUS | Status: AC
  Administered 2016-04-16 – 2016-04-17 (×3): 1 g via INTRAVENOUS
  Filled 2016-04-16 (×4): qty 50

## 2016-04-16 MED ORDER — MORPHINE SULFATE (PF) 2 MG/ML IV SOLN
1.0000 mg | INTRAVENOUS | Status: DC | PRN
  Administered 2016-04-16: 1 mg via INTRAVENOUS
  Filled 2016-04-16: qty 1

## 2016-04-16 MED ORDER — LIDOCAINE 2% (20 MG/ML) 5 ML SYRINGE
INTRAMUSCULAR | Status: AC
  Filled 2016-04-16: qty 5

## 2016-04-16 MED ORDER — HYDROCHLOROTHIAZIDE 12.5 MG PO CAPS
12.5000 mg | ORAL_CAPSULE | Freq: Every day | ORAL | Status: DC
  Administered 2016-04-16 – 2016-04-17 (×2): 12.5 mg via ORAL
  Filled 2016-04-16 (×2): qty 1

## 2016-04-16 MED ORDER — MIDAZOLAM HCL 2 MG/2ML IJ SOLN
INTRAMUSCULAR | Status: AC
  Filled 2016-04-16: qty 2

## 2016-04-16 MED ORDER — DONEPEZIL HCL 10 MG PO TABS
10.0000 mg | ORAL_TABLET | Freq: Every day | ORAL | Status: DC
  Administered 2016-04-16: 10 mg via ORAL
  Filled 2016-04-16: qty 1

## 2016-04-16 MED ORDER — ESMOLOL HCL 100 MG/10ML IV SOLN
INTRAVENOUS | Status: AC
  Filled 2016-04-16: qty 10

## 2016-04-16 MED ORDER — SODIUM CHLORIDE 0.9 % IR SOLN
Status: DC | PRN
  Administered 2016-04-16: 3000 mL

## 2016-04-16 MED ORDER — EPHEDRINE 5 MG/ML INJ
INTRAVENOUS | Status: AC
  Filled 2016-04-16: qty 10

## 2016-04-16 MED ORDER — SUGAMMADEX SODIUM 200 MG/2ML IV SOLN
INTRAVENOUS | Status: AC
  Filled 2016-04-16: qty 2

## 2016-04-16 MED ORDER — MENTHOL 3 MG MT LOZG: 1.0000 | LOZENGE | OROMUCOSAL | Status: DC | PRN

## 2016-04-16 MED ORDER — DOCUSATE SODIUM 100 MG PO CAPS
100.0000 mg | ORAL_CAPSULE | Freq: Two times a day (BID) | ORAL | Status: DC
  Administered 2016-04-16 – 2016-04-17 (×3): 100 mg via ORAL
  Filled 2016-04-16 (×3): qty 1

## 2016-04-16 MED ORDER — ROCURONIUM BROMIDE 100 MG/10ML IV SOLN
INTRAVENOUS | Status: DC | PRN
  Administered 2016-04-16: 40 mg via INTRAVENOUS
  Administered 2016-04-16: 10 mg via INTRAVENOUS

## 2016-04-16 MED ORDER — PHENYLEPHRINE 40 MCG/ML (10ML) SYRINGE FOR IV PUSH (FOR BLOOD PRESSURE SUPPORT)
PREFILLED_SYRINGE | INTRAVENOUS | Status: AC
  Filled 2016-04-16: qty 10

## 2016-04-16 MED ORDER — FLEET ENEMA 7-19 GM/118ML RE ENEM: 1.0000 | ENEMA | Freq: Once | RECTAL | Status: DC | PRN

## 2016-04-16 MED ORDER — POTASSIUM CHLORIDE IN NACL 20-0.45 MEQ/L-% IV SOLN
INTRAVENOUS | Status: DC
  Administered 2016-04-16 – 2016-04-17 (×2): via INTRAVENOUS
  Filled 2016-04-16 (×4): qty 1000

## 2016-04-16 MED ORDER — MOMETASONE FURO-FORMOTEROL FUM 100-5 MCG/ACT IN AERO
2.0000 | INHALATION_SPRAY | Freq: Two times a day (BID) | RESPIRATORY_TRACT | Status: DC
  Administered 2016-04-16 – 2016-04-17 (×2): 2 via RESPIRATORY_TRACT
  Filled 2016-04-16: qty 8.8

## 2016-04-16 MED ORDER — EPHEDRINE SULFATE 50 MG/ML IJ SOLN
INTRAMUSCULAR | Status: DC | PRN
  Administered 2016-04-16 (×2): 5 mg via INTRAVENOUS
  Administered 2016-04-16: 10 mg via INTRAVENOUS

## 2016-04-16 MED ORDER — PHENYLEPHRINE HCL 10 MG/ML IJ SOLN
INTRAMUSCULAR | Status: DC | PRN
  Administered 2016-04-16: 80 ug via INTRAVENOUS

## 2016-04-16 MED ORDER — ONDANSETRON HCL 4 MG/2ML IJ SOLN
INTRAMUSCULAR | Status: DC | PRN
  Administered 2016-04-16: 4 mg via INTRAVENOUS

## 2016-04-16 MED ORDER — DEXAMETHASONE SODIUM PHOSPHATE 10 MG/ML IJ SOLN
INTRAMUSCULAR | Status: DC | PRN
  Administered 2016-04-16: 5 mg via INTRAVENOUS

## 2016-04-16 MED ORDER — OXYCODONE HCL 5 MG PO TABS
5.0000 mg | ORAL_TABLET | ORAL | Status: DC | PRN
  Administered 2016-04-16 – 2016-04-17 (×3): 10 mg via ORAL
  Filled 2016-04-16 (×3): qty 2
  Filled 2016-04-16: qty 1

## 2016-04-16 MED ORDER — HYDROMORPHONE HCL 1 MG/ML IJ SOLN: 0.2500 mg | INTRAMUSCULAR | Status: DC | PRN

## 2016-04-16 MED ORDER — DEXAMETHASONE SODIUM PHOSPHATE 10 MG/ML IJ SOLN
INTRAMUSCULAR | Status: AC
  Filled 2016-04-16: qty 1

## 2016-04-16 MED ORDER — MEPERIDINE HCL 25 MG/ML IJ SOLN: 6.2500 mg | INTRAMUSCULAR | Status: DC | PRN

## 2016-04-16 MED ORDER — ROSUVASTATIN CALCIUM 10 MG PO TABS
10.0000 mg | ORAL_TABLET | Freq: Every day | ORAL | Status: DC
  Administered 2016-04-17: 10 mg via ORAL
  Filled 2016-04-16: qty 1

## 2016-04-16 MED ORDER — ACETAMINOPHEN 325 MG PO TABS
650.0000 mg | ORAL_TABLET | Freq: Four times a day (QID) | ORAL | Status: DC | PRN
  Filled 2016-04-16: qty 2

## 2016-04-16 MED ORDER — SODIUM CHLORIDE 0.9 % IV SOLN
1000.0000 mg | INTRAVENOUS | Status: AC
  Administered 2016-04-16: 1000 mg via INTRAVENOUS
  Filled 2016-04-16: qty 10

## 2016-04-16 MED ORDER — IRBESARTAN 150 MG PO TABS
150.0000 mg | ORAL_TABLET | Freq: Every day | ORAL | Status: DC
  Administered 2016-04-16 – 2016-04-17 (×2): 150 mg via ORAL
  Filled 2016-04-16 (×2): qty 1

## 2016-04-16 MED ORDER — FENTANYL CITRATE (PF) 250 MCG/5ML IJ SOLN
INTRAMUSCULAR | Status: AC
  Filled 2016-04-16: qty 5

## 2016-04-16 MED ORDER — POVIDONE-IODINE 7.5 % EX SOLN
Freq: Once | CUTANEOUS | Status: DC
  Filled 2016-04-16: qty 118

## 2016-04-16 MED ORDER — LACTATED RINGERS IV SOLN
INTRAVENOUS | Status: DC | PRN
  Administered 2016-04-16 (×2): via INTRAVENOUS

## 2016-04-16 MED ORDER — 0.9 % SODIUM CHLORIDE (POUR BTL) OPTIME
TOPICAL | Status: DC | PRN
  Administered 2016-04-16: 1000 mL

## 2016-04-16 MED ORDER — PHENOL 1.4 % MT LIQD: 1.0000 | OROMUCOSAL | Status: DC | PRN

## 2016-04-16 MED ORDER — ALUM & MAG HYDROXIDE-SIMETH 200-200-20 MG/5ML PO SUSP: 30.0000 mL | ORAL | Status: DC | PRN

## 2016-04-16 SURGICAL SUPPLY — 79 items
BASEPLATE P2 COATD GLND 6.5X30 (Shoulder) ×1 IMPLANT
BIT DRILL 5/64X5 DISP (BIT) ×2 IMPLANT
BLADE SAW SAG 73X25 THK (BLADE) ×1
BLADE SAW SGTL 73X25 THK (BLADE) ×1 IMPLANT
BLADE SURG 15 STRL LF DISP TIS (BLADE) ×1 IMPLANT
BLADE SURG 15 STRL SS (BLADE) ×1
BOWL SMART MIX CTS (DISPOSABLE) IMPLANT
BSPLAT GLND 30 STRL LF SHLDR (Shoulder) ×1 IMPLANT
CHLORAPREP W/TINT 26ML (MISCELLANEOUS) ×2 IMPLANT
COVER MAYO STAND STRL (DRAPES) IMPLANT
COVER SURGICAL LIGHT HANDLE (MISCELLANEOUS) ×2 IMPLANT
DRAPE INCISE IOBAN 66X45 STRL (DRAPES) ×2 IMPLANT
DRAPE ORTHO SPLIT 77X108 STRL (DRAPES) ×4
DRAPE SURG ORHT 6 SPLT 77X108 (DRAPES) ×2 IMPLANT
DRSG AQUACEL AG ADV 3.5X10 (GAUZE/BANDAGES/DRESSINGS) ×2 IMPLANT
ELECT BLADE 4.0 EZ CLEAN MEGAD (MISCELLANEOUS) ×2
ELECT REM PT RETURN 9FT ADLT (ELECTROSURGICAL) ×2
ELECTRODE BLDE 4.0 EZ CLN MEGD (MISCELLANEOUS) ×1 IMPLANT
ELECTRODE REM PT RTRN 9FT ADLT (ELECTROSURGICAL) ×1 IMPLANT
EVACUATOR 1/8 PVC DRAIN (DRAIN) IMPLANT
GLOVE BIO SURGEON STRL SZ7 (GLOVE) ×2 IMPLANT
GLOVE BIO SURGEON STRL SZ7.5 (GLOVE) ×2 IMPLANT
GLOVE BIOGEL PI IND STRL 7.0 (GLOVE) ×1 IMPLANT
GLOVE BIOGEL PI IND STRL 8 (GLOVE) ×1 IMPLANT
GLOVE BIOGEL PI INDICATOR 7.0 (GLOVE) ×1
GLOVE BIOGEL PI INDICATOR 8 (GLOVE) ×1
GOWN STRL REUS W/ TWL LRG LVL3 (GOWN DISPOSABLE) ×2 IMPLANT
GOWN STRL REUS W/ TWL XL LVL3 (GOWN DISPOSABLE) ×2 IMPLANT
GOWN STRL REUS W/TWL LRG LVL3 (GOWN DISPOSABLE) ×4
GOWN STRL REUS W/TWL XL LVL3 (GOWN DISPOSABLE) ×4
HANDPIECE INTERPULSE COAX TIP (DISPOSABLE) ×2
HOOD PEEL AWAY FLYTE STAYCOOL (MISCELLANEOUS) ×4 IMPLANT
INSERT EPOLY STND HUMERUS 32MM (Shoulder) ×2 IMPLANT
INSERT EPOLYSTD HUMERUS 32MM (Shoulder) ×1 IMPLANT
KIT BASIN OR (CUSTOM PROCEDURE TRAY) ×2 IMPLANT
KIT ROOM TURNOVER OR (KITS) ×2 IMPLANT
MANIFOLD NEPTUNE II (INSTRUMENTS) ×2 IMPLANT
NEEDLE HYPO 25GX1X1/2 BEV (NEEDLE) IMPLANT
NEEDLE MAYO TROCAR (NEEDLE) IMPLANT
NOZZLE PRISM 8.5MM (MISCELLANEOUS) IMPLANT
NS IRRIG 1000ML POUR BTL (IV SOLUTION) ×2 IMPLANT
P2 COATDE GLNOID BSEPLT 6.5X30 (Shoulder) ×2 IMPLANT
PACK SHOULDER (CUSTOM PROCEDURE TRAY) ×2 IMPLANT
PAD ARMBOARD 7.5X6 YLW CONV (MISCELLANEOUS) ×4 IMPLANT
RESTRAINT HEAD UNIVERSAL NS (MISCELLANEOUS) ×2 IMPLANT
RETRIEVER SUT HEWSON (MISCELLANEOUS) ×2 IMPLANT
SCREW BONE LOCKING RSP 5.0X14 (Screw) ×2 IMPLANT
SCREW BONE LOCKING RSP 5.0X30 (Screw) ×2 IMPLANT
SCREW BONE LOCKING RSP 5.0X34 (Screw) ×2 IMPLANT
SCREW BONE RSP LOCK 5X14 (Screw) ×1 IMPLANT
SCREW BONE RSP LOCK 5X18 (Screw) ×1 IMPLANT
SCREW BONE RSP LOCK 5X30 (Screw) ×1 IMPLANT
SCREW BONE RSP LOCK 5X34 (Screw) ×1 IMPLANT
SCREW BONE RSP LOCKING 18MM LG (Screw) ×2 IMPLANT
SCREW RETAIN W/HEAD 32MM (Shoulder) ×2 IMPLANT
SET HNDPC FAN SPRY TIP SCT (DISPOSABLE) ×1 IMPLANT
SLING ARM IMMOBILIZER LRG (SOFTGOODS) ×2 IMPLANT
SLING ARM IMMOBILIZER MED (SOFTGOODS) IMPLANT
SPONGE LAP 18X18 X RAY DECT (DISPOSABLE) ×2 IMPLANT
SPONGE LAP 4X18 X RAY DECT (DISPOSABLE) IMPLANT
STEM REV PRIMARY 8X108 (Shoulder) ×2 IMPLANT
STRIP CLOSURE SKIN 1/2X4 (GAUZE/BANDAGES/DRESSINGS) ×2 IMPLANT
SUCTION FRAZIER HANDLE 10FR (MISCELLANEOUS) ×1
SUCTION TUBE FRAZIER 10FR DISP (MISCELLANEOUS) ×1 IMPLANT
SUPPORT WRAP ARM LG (MISCELLANEOUS) ×2 IMPLANT
SUT ETHIBOND NAB CT1 #1 30IN (SUTURE) ×4 IMPLANT
SUT MNCRL AB 4-0 PS2 18 (SUTURE) ×2 IMPLANT
SUT SILK 2 0 TIES 17X18 (SUTURE)
SUT SILK 2-0 18XBRD TIE BLK (SUTURE) IMPLANT
SUT VIC AB 0 CTB1 27 (SUTURE) ×2 IMPLANT
SUT VIC AB 2-0 CT1 27 (SUTURE) ×2
SUT VIC AB 2-0 CT1 TAPERPNT 27 (SUTURE) ×1 IMPLANT
SYR CONTROL 10ML LL (SYRINGE) IMPLANT
SYRINGE TOOMEY DISP (SYRINGE) IMPLANT
TAPE FIBER 2MM 7IN #2 BLUE (SUTURE) IMPLANT
TOWEL OR 17X24 6PK STRL BLUE (TOWEL DISPOSABLE) ×2 IMPLANT
TOWEL OR 17X26 10 PK STRL BLUE (TOWEL DISPOSABLE) ×2 IMPLANT
WATER STERILE IRR 1000ML POUR (IV SOLUTION) IMPLANT
YANKAUER SUCT BULB TIP NO VENT (SUCTIONS) IMPLANT

## 2016-04-16 NOTE — Discharge Instructions (Signed)

## 2016-04-16 NOTE — Anesthesia Procedure Notes (Addendum)
Anesthesia Regional Block:  Interscalene brachial plexus block  Pre-Anesthetic Checklist: ,, timeout performed, Correct Patient, Correct Site, Correct Laterality, Correct Procedure, Correct Position, site marked, Risks and benefits discussed, Surgical consent,  Pre-op evaluation,  Post-op pain management  Laterality: Right  Prep: chloraprep       Needles:  Injection technique: Single-shot  Needle Type: Stimulator Needle - 40     Needle Length: 4cm 4 cm Needle Gauge: 22 and 22 G    Additional Needles:  Procedures: ultrasound guided (picture in chart) Interscalene brachial plexus block Narrative:  Injection made incrementally with aspirations every 5 mL. Anesthesiologist: Lewie LoronGERMEROTH, JOHN  Additional Notes: BP cuff, EKG monitors applied. Sedation begun. Nerve location verified with U/S. Anesthetic injected incrementally, slowly , and after neg aspirations under direct u/s guidance. Good perineural spread. Tolerated well.   Procedure Name: Intubation Date/Time: 04/16/2016 8:20 AM Performed by: Army FossaPULLIAM, Adryan Shin DANE Pre-anesthesia Checklist: Patient identified, Emergency Drugs available, Suction available, Patient being monitored and Timeout performed Patient Re-evaluated:Patient Re-evaluated prior to inductionOxygen Delivery Method: Circle system utilized Preoxygenation: Pre-oxygenation with 100% oxygen Intubation Type: IV induction Ventilation: Mask ventilation without difficulty Laryngoscope Size: Mac and 3 Grade View: Grade I Tube type: Oral Number of attempts: 1 Airway Equipment and Method: Patient positioned with wedge pillow and Stylet Placement Confirmation: ETT inserted through vocal cords under direct vision,  positive ETCO2,  CO2 detector and breath sounds checked- equal and bilateral Secured at: 23 cm Tube secured with: Tape Dental Injury: Teeth and Oropharynx as per pre-operative assessment

## 2016-04-16 NOTE — Transfer of Care (Signed)
Immediate Anesthesia Transfer of Care Note  Patient: Michael [name]  Procedure(s) Performed: Procedure(s) with comments: RIGHT REVERSE SHOULDER ARTHROPLASTY (Right) - Right reverse total shoulder  Patient Location: PACU  Anesthesia Type:General  Level of Consciousness:  sedated, patient cooperative and responds to stimulation  Airway & Oxygen Therapy:Patient Spontanous Breathing and Patient connected to face mask oxgen  Post-op Assessment:  Report given to PACU RN and Post -op Vital signs reviewed and stable  Post vital signs:  Reviewed and stable  Last Vitals:  Filed Vitals:   04/16/16 0549  BP: 151/71  Pulse: 59  Temp: 36.6 C  Resp: 20    Complications: No apparent anesthesia complications

## 2016-04-16 NOTE — Anesthesia Postprocedure Evaluation (Signed)
Anesthesia Post Note  Patient: Michael DimmerCurtis T Bertoni  Procedure(s) Performed: Procedure(s) (LRB): RIGHT REVERSE SHOULDER ARTHROPLASTY (Right)  Patient location during evaluation: PACU Anesthesia Type: General and Regional Level of consciousness: sedated and patient cooperative Pain management: pain level controlled Vital Signs Assessment: post-procedure vital signs reviewed and stable Respiratory status: spontaneous breathing Cardiovascular status: stable Anesthetic complications: no    Last Vitals:  Filed Vitals:   04/16/16 0948 04/16/16 1000  BP:    Pulse: 58   Temp:  36.4 C  Resp: 14     Last Pain: There were no vitals filed for this visit.               Lewie LoronJohn Akeen Ledyard

## 2016-04-16 NOTE — Op Note (Signed)
Procedure(s): RIGHT REVERSE SHOULDER ARTHROPLASTY Procedure Note  Allie DimmerCurtis T Weber male 75 y.o. 04/16/2016  Procedure(s) and Anesthesia Type:    * RIGHT REVERSE SHOULDER ARTHROPLASTY - General   Indications:  75 y.o. male  With irrepairable right shoulder rotator cuff tear. Pain and dysfunction interfered with quality of life and nonoperative treatment with activity modification, NSAIDS, injections and arthroscopic management failed.     Surgeon: Mable ParisHANDLER,Dorette Hartel WILLIAM   Assistants: Damita Lackanielle Lalibert PA-C [facility](Danielle was present and scrubbed throughout the procedure and was essential in positioning, retraction, exposure, and closure)  Anesthesia: General endotracheal anesthesia with preoperative interscalene block given by the attending anesthesiologist    Procedure Detail  RIGHT REVERSE SHOULDER ARTHROPLASTY   Estimated Blood Loss:  200 mL         Drains: none  Blood Given: none          Specimens: none        Complications:  * No complications entered in OR log *         Disposition: PACU - hemodynamically stable.         Condition: stable      OPERATIVE FINDINGS:  A DJO Altivate pressfit reverse total shoulder arthroplasty was placed with a  size 8 stem, a 32 glenosphere, and a standard poly insert. The base plate  fixation was excellent.  PROCEDURE: The patient was identified in the preoperative holding area  where I personally marked the operative site after verifying site, side,  and procedure with the patient. An interscalene block given by  the attending anesthesiologist in the holding area and the patient was taken back to the operating room where all extremities were  carefully padded in position after general anesthesia was induced. She  was placed in a beach-chair position and the operative upper extremity was  prepped and draped in a standard sterile fashion. An approximately 10-  cm incision was made from the tip of the coracoid process to the center   point of the humerus at the level of the axilla. Dissection was carried  down through subcutaneous tissues to the level of the cephalic vein  which was taken laterally with the deltoid. The pectoralis major was  retracted medially. The subdeltoid space was developed and the lateral  edge of the conjoined tendon was identified. The undersurface of  conjoined tendon was palpated and the musculocutaneous nerve was not in  the field. Retractor was placed underneath the conjoined and second  retractor was placed lateral into the deltoid. The circumflex humeral  artery and vessels were identified and clamped and coagulated. The  biceps tendon was absent.  The subscapularis was taken down as a peel.  The  joint was then gently externally rotated while the capsule was released  from the humeral neck around to just beyond the 6 o'clock position. At  this point, the joint was dislocated and the humeral head was presented  into the wound. The excessive osteophyte formation was removed with a  large rongeur.  The cutting guide was used to make the appropriate  head cut and the head was saved for potentially bone grafting.  The glenoid was exposed with the arm in an  abducted extended position. The anterior and posterior labrum were  completely excised and the capsule was released circumferentially to  allow for exposure of the glenoid for preparation. The 2.5 mm drill was  placed using the guide in 5-10 inferior angulation and the tap was then advanced in the same hole. Small  and large reamers were then used. The tap was then removed and the Metaglene was then screwed in with excellent purchase.  The peripheral guide was then used to drilled measured and filled peripheral locking screws. The size  32  glenosphere was then impacted on the [facility] taper and the central screw was placed. The humerus was then again exposed and the diaphyseal reamers were used followed by the metaphyseal reamers. The final broach  was left in place in the proximal trial was placed. The joint was reduced and with this implant it was felt that soft tissue tensioning was appropriate with excellent stability and excellent range of motion. Therefore, final humeral stem was placed press-fit with bone graft from the humeral head.  And then the trial polyethylene inserts were tested again and the above implant was felt to be the most appropriate for final insertion. The joint was reduced taken through full range of motion and felt to be stable. Soft tissue tension was appropriate.  The joint was then copiously irrigated with pulse  lavage and the wound was then closed. The subscapularis was not repaired.  Skin was closed with 2-0 Vicryl in a deep dermal layer and 4-0  Monocryl for skin closure. Steri-Strips were applied. Sterile  dressings were then applied as well as a sling. The patient was allowed  to awaken from general anesthesia, transferred to stretcher, and taken  to recovery room in stable condition.   POSTOPERATIVE PLAN: The patient will be kept in the hospital postoperatively  for pain control and therapy.

## 2016-04-16 NOTE — H&P (Signed)
Michael [name] is an 75 y.o. male.   Chief Complaint: R shoulder pain and dysfunction. HPI: 75 yo with R shoulder severe pain and dysfunction with irrepairable rotator cuff tear.  Failed extensive conservative management and arthroscopic treatment.  Past Medical History  Diagnosis Date  . Hypertension   . Coronary artery disease     does not see a cardiologist, reports no cardiac symptoms  . Hyperlipidemia   . Seasonal allergies     Past Surgical History  Procedure Laterality Date  . Colonoscopy  07/31/2011    Procedure: COLONOSCOPY;  Surgeon: Dalia HeadingMark A Jenkins;  Location: [facility];  Service: Gastroenterology;  Laterality: N/A;  . Back surgery      fusion,rods and cage    Family History  Problem Relation Age of Onset  . Heart disease    . Asthma     Social History:  reports that he quit smoking about 25 years ago. His smoking use included Cigarettes. He has a 60 pack-year smoking history. He has never used smokeless tobacco. He reports that he does not drink alcohol or use illicit drugs.  Allergies: No Known Allergies  Medications Prior to Admission  Medication Sig Dispense Refill  . donepezil (ARICEPT) 10 MG tablet Take 10 mg by mouth at bedtime.    . fexofenadine (ALLEGRA) 180 MG tablet Take 180 mg by mouth daily.    . Fluticasone-Salmeterol (ADVAIR) 100-50 MCG/DOSE AEPB Inhale 1 puff into the lungs every 12 (twelve) hours.      . rosuvastatin (CRESTOR) 10 MG tablet Take 10 mg by mouth daily.      . valsartan-hydrochlorothiazide (DIOVAN-HCT) 160-12.5 MG per tablet Take 1 tablet by mouth daily.        No results found for this or any previous visit (from the past 48 hour(s)). No results found.  Review of Systems  All other systems reviewed and are negative.   Blood pressure 151/71, pulse 59, temperature 97.8 F (36.6 C), resp. rate 20, height 5\' 8"  (1.727 m), weight 69.854 kg (154 lb), SpO2 97 %. Physical Exam  Constitutional: He is oriented to person, place, and  time. He appears well-developed and well-nourished.  HENT:  Head: Atraumatic.  Eyes: EOM are normal.  Cardiovascular: Intact distal pulses.   Respiratory: Effort normal.  Musculoskeletal:  R shoulder pain and weakness with RC testing.  Neurological: He is alert and oriented to person, place, and time.  Skin: Skin is warm and dry.  Psychiatric: He has a normal mood and affect.     Assessment/Plan 75 yo with R shoulder severe pain and dysfunction with irrepairable rotator cuff tear.  Failed extensive conservative management and arthroscopic treatment. Plan R reverse TSA Risks / benefits of surgery discussed Consent on chart  NPO for OR Preop antibiotics   Mable ParisHANDLER,Michael Hagemann WILLIAM, Michael [name] 04/16/2016, 7:15 AM

## 2016-04-17 ENCOUNTER — Encounter (HOSPITAL_COMMUNITY): Payer: Self-pay | Admitting: Orthopedic Surgery

## 2016-04-17 LAB — CBC
HEMATOCRIT: 38.3 % — AB (ref 39.0–52.0)
HEMOGLOBIN: 12.9 g/dL — AB (ref 13.0–17.0)
MCH: 29.8 pg (ref 26.0–34.0)
MCHC: 33.7 g/dL (ref 30.0–36.0)
MCV: 88.5 fL (ref 78.0–100.0)
Platelets: 196 10*3/uL (ref 150–400)
RBC: 4.33 MIL/uL (ref 4.22–5.81)
RDW: 13.7 % (ref 11.5–15.5)
WBC: 9.4 10*3/uL (ref 4.0–10.5)

## 2016-04-17 MED ORDER — OXYCODONE-ACETAMINOPHEN 5-325 MG PO TABS: 1.0000 | ORAL_TABLET | ORAL | Status: DC | PRN

## 2016-04-17 MED ORDER — DOCUSATE SODIUM 100 MG PO CAPS: 100.0000 mg | ORAL_CAPSULE | Freq: Three times a day (TID) | ORAL | Status: DC | PRN

## 2016-04-17 NOTE — Discharge Summary (Signed)
Patient ID: Michael [name] MRN: [id number] DOB/AGE: 77/10/1940 77 y.o.  Admit date: 04/16/2016 Discharge date: 04/17/2016  Admission Diagnoses:  Active Problems:   S/p reverse total shoulder arthroplasty   Discharge Diagnoses:  Same  Past Medical History  Diagnosis Date  . Hypertension   . Coronary artery disease     does not see a cardiologist, reports no cardiac symptoms  . Hyperlipidemia   . Seasonal allergies     Surgeries: Procedure(s): RIGHT REVERSE SHOULDER ARTHROPLASTY on 04/16/2016   Consultants:    Discharged Condition: Improved  Hospital Course: Michael [name] is an 75 y.o. male who was admitted 04/16/2016 for operative treatment of right shoulder rotator cuff tear arthropathy. Patient has severe unremitting pain that affects sleep, daily activities, and work/hobbies. After pre-op clearance the patient was taken to the operating room on 04/16/2016 and underwent  Procedure(s): RIGHT REVERSE SHOULDER ARTHROPLASTY.    Patient was given perioperative antibiotics: Anti-infectives    Start     Dose/Rate Route Frequency Ordered Stop   04/16/16 1400  ceFAZolin (ANCEF) IVPB 1 g/50 mL premix     1 g 100 mL/hr over 30 Minutes Intravenous Every 6 hours 04/16/16 1009 04/17/16 0236   04/16/16 0700  ceFAZolin (ANCEF) IVPB 2g/100 mL premix     2 g 200 mL/hr over 30 Minutes Intravenous To ShortStay Surgical 04/15/16 0814 04/16/16 0750       Patient was given sequential compression devices, early ambulation, and aspiring BID to prevent DVT.  Patient benefited maximally from hospital stay and there were no complications.    Recent vital signs: Patient Vitals for the past 24 hrs:  BP Temp Temp src Pulse Resp SpO2  04/17/16 0753 - - - - - 96 %  04/17/16 0514 (!) 110/58 mmHg 97.8 F (36.6 C) Oral (!) 58 16 96 %  04/17/16 0030 118/72 mmHg 98.2 F (36.8 C) Oral 65 16 100 %  04/16/16 2100 128/61 mmHg 98.3 F (36.8 C) Oral 68 16 100 %  04/16/16 1700 133/68 mmHg 97 F (36.1 C) Oral  68 16 100 %  04/16/16 1431 (!) 141/64 mmHg - - (!) 58 - -  04/16/16 1000 - 97.6 F (36.4 C) - - - -  04/16/16 0950 (!) 133/52 mmHg - - (!) 53 15 100 %  04/16/16 0948 - - - (!) 58 14 100 %  04/16/16 0947 (!) 145/72 mmHg - - (!) 58 14 99 %  04/16/16 0945 - - - 60 15 98 %  04/16/16 0936 - - - 67 14 98 %  04/16/16 0932 (!) 147/65 mmHg - - (!) 58 13 98 %  04/16/16 0924 - - - 60 14 99 %  04/16/16 0915 - 97.9 F (36.6 C) - - - -     Recent laboratory studies:  Recent Labs  04/17/16 0616  WBC 9.4  HGB 12.9*  HCT 38.3*  PLT 196     Discharge Medications:     Medication List    TAKE these medications        docusate sodium 100 MG capsule  Commonly known as:  COLACE  Take 1 capsule (100 mg total) by mouth 3 (three) times daily as needed.     donepezil 10 MG tablet  Commonly known as:  ARICEPT  Take 10 mg by mouth at bedtime.     fexofenadine 180 MG tablet  Commonly known as:  ALLEGRA  Take 180 mg by mouth daily.     Fluticasone-Salmeterol 100-50 MCG/DOSE  Aepb  Commonly known as:  ADVAIR  Inhale 1 puff into the lungs every 12 (twelve) hours.     oxyCODONE-acetaminophen 5-325 MG tablet  Commonly known as:  ROXICET  Take 1-2 tablets by mouth every 4 (four) hours as needed for severe pain.     rosuvastatin 10 MG tablet  Commonly known as:  CRESTOR  Take 10 mg by mouth daily.     valsartan-hydrochlorothiazide 160-12.5 MG tablet  Commonly known as:  DIOVAN-HCT  Take 1 tablet by mouth daily.        Diagnostic Studies: Dg Chest 2 View  04/08/2016  CLINICAL DATA:  Arthroplasty. History of hypertension coronary artery disease. History smoking . EXAM: CHEST  2 VIEW COMPARISON:  10/28/2012. FINDINGS: Mediastinum hilar structures normal. Lungs are clear. Heart size normal. No pleural effusion or pneumothorax. Degenerative changes thoracic spine. Pectus deformity. Prior lumbar spine fusion. IMPRESSION: No acute cardiopulmonary disease . Electronically Signed   By: Maisie Fushomas   Register   On: 04/08/2016 11:41   Dg Shoulder Right Port  04/16/2016  CLINICAL DATA:  Postop right total shoulder arthroplasty. EXAM: PORTABLE RIGHT SHOULDER - 2+ VIEW COMPARISON:  12/14/2015 FINDINGS: The hardware components of her right shoulder arthroplasty device are identified. The components appear to be in anatomic alignment. Gas is noted within the surrounding soft tissues. 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 IMPRESSION: 1. No complications identified status post right total shoulder arthroplasty. Electronically Signed   By: Signa Kellaylor  Stroud M.D.   On: 04/16/2016 09:38    Disposition: 01-Home or Self Care        Follow-up Information    Follow up with Mable ParisHANDLER,JUSTIN WILLIAM, MD. Schedule an appointment as soon as possible for a visit in 2 days.   Specialty:  Orthopedic Surgery   Contact information:   [address] [place] [address] [phone]        Signed: Jiles HaroldLALIBERTE, Daman Steffenhagen 04/17/2016, 7:55 AM

## 2016-04-17 NOTE — Progress Notes (Signed)
   04/17/16 0945  Clinical Encounter Type  Visited With Patient;Family  Visit Type Initial  Spiritual Encounters  Spiritual Needs Prayer  On morning rounds chaplain met with patient.  Patient shared he was being discharged today.  Chaplain had prayer with patient and wife.

## 2016-04-17 NOTE — Progress Notes (Signed)
Reviewed discharge instructions/medications with patient and patient's wife.  Answered all their questions.

## 2016-04-17 NOTE — Care Management Important Message (Signed)
Important Message  Patient Details  Name: Michael [name] MRN: [id number] Date of Birth: 06/12/1941   Medicare Important Message Given:  Yes    Bernadette HoitShoffner, Burns Timson Coleman 04/17/2016, 10:34 AM

## 2016-04-17 NOTE — Progress Notes (Signed)
   PATIENT ID: Michael [name]   1 Day Post-Op Procedure(s) (LRB): RIGHT REVERSE SHOULDER ARTHROPLASTY (Right)  Subjective: Doing well this am. Reports affects from block has not worn off. No pain in right shoulder or arm. Resting comfortably.   Objective:  Filed Vitals:   04/17/16 0030 04/17/16 0514  BP: 118/72 110/58  Pulse: 65 58  Temp: 98.2 F (36.8 C) 97.8 F (36.6 C)  Resp: 16 16     R UE dressing c/d/i Reports decreased sensation in hand, wiggles digits minimally Minimal hand swelling, radial pulse 2+   Labs:   Recent Labs  04/17/16 0616  HGB 12.9*   Recent Labs  04/17/16 0616  WBC 9.4  RBC 4.33  HCT 38.3*  PLT 196  No results for input(s): NA, K, CL, CO2, BUN, CREATININE, GLUCOSE, CALCIUM in the last 72 hours.  Assessment and Plan: Doing well this morning, block has not work off OT- hand wrist elbow ROM only, sling otherwise Okay to d/c today when cleared by OT/pain controlled Percocet for home pain control, script in chart FU with Dr. Ave Filterhandler in 2 weeks  VTE proph: ASA 81 mg BID, SCDs

## 2016-04-17 NOTE — Evaluation (Addendum)
Occupational Therapy Evaluation Patient Details Name: Michael [name] MRN: [id number] DOB: 08/16/1941 Today's Date: 04/17/2016    History of Present Illness 75 yo male s/p RIGHT reverse total shoulder arthroplasty. PMH: HTN, CAD, seasonal allergies    Clinical Impression   Patient presenting with decreased ADL and functional mobility independence secondary to above. Patient independent PTA. Patient currently functioning at an overall mod assist level for ADLs and overall supervision for functional mobility/ambulation. Patient will benefit from acute OT to increase overall independence in the areas of ADLs, functional mobility, RUE restrictions, limitations, & HEP, and overall safety in order to safely discharge home with wife.  See Navigator for information regarding RUE exercises completed and taught during session AND Shoulder instructions taught.      Follow Up Recommendations  Outpatient OT;Supervision/Assistance - 24 hour (OPOT once MD clears)    Equipment Recommendations  None recommended by OT    Recommendations for Other Services   None at this time   Precautions / Restrictions Precautions Precautions: Fall;Shoulder Type of Shoulder Precautions: NO PROM/AROM to shoulder, NWB, AROM to elbow, wrist, hand OK Shoulder Interventions: Shoulder sling/immobilizer;Off for dressing/bathing/exercises Restrictions Weight Bearing Restrictions: Yes RUE Weight Bearing: Non weight bearing     Mobility Bed Mobility Overal bed mobility: Needs Assistance Bed Mobility: Supine to Sit     Supine to sit: Modified independent (Device/Increase time)     General bed mobility comments: Increased time and use of bed rails (getting off on Lt side of bed), HOB down  Transfers Overall transfer level: Modified independent Equipment used: None General transfer comment: Mod I for increased time     Balance Overall balance assessment: No apparent balance deficits (not formally assessed)     ADL Overall ADL's : Needs assistance/impaired Eating/Feeding: Minimal assistance;Sitting   Grooming: Minimal assistance;Standing   Upper Body Bathing: Moderate assistance;Sitting   Lower Body Bathing: Moderate assistance;Sit to/from stand   Upper Body Dressing : Moderate assistance;Sitting   Lower Body Dressing: Moderate assistance;Sit to/from stand   Toilet Transfer: Supervision/safety;Ambulation General ADL Comments: Limited due to RUE restricitions, wife available to assist with ADLs prn    Vision Vision Assessment?: No apparent visual deficits          Pertinent Vitals/Pain Pain Assessment: No/denies pain (Per patient report, block has not worn off yet)     Hand Dominance Right   Extremity/Trunk Assessment Upper Extremity Assessment Upper Extremity Assessment: RUE deficits/detail RUE Deficits / Details: Recent reverse TSA, unable to fully assess due to restricitions and limitations.  RUE Sensation: decreased light touch;decreased proprioception (Pt reports block has not worn off yet) RUE Coordination: decreased fine motor;decreased gross motor (due to block??)   Lower Extremity Assessment Lower Extremity Assessment: Overall WFL for tasks assessed   Cervical / Trunk Assessment Cervical / Trunk Assessment: Normal   Communication Communication Communication: No difficulties   Cognition Arousal/Alertness: Awake/alert Behavior During Therapy: WFL for tasks assessed/performed Overall Cognitive Status: Within Functional Limits for tasks assessed              Home Living Family/patient expects to be discharged to:: Private residence Living Arrangements: Spouse/significant other Available Help at Discharge: Family;Available 24 hours/day Type of Home: House Bathroom Shower/Tub: Walk-in shower;Door   Foot LockerBathroom Toilet: Standard     Home Equipment: Hand held shower head;Shower seat - built in    Prior Functioning/Environment Level of Independence:  Independent     OT Diagnosis: Generalized weakness;Acute pain   OT Problem List: Decreased strength;Decreased activity tolerance;Decreased range of  motion;Decreased coordination;Decreased safety awareness;Decreased knowledge of precautions;Pain;Impaired UE functional use   OT Treatment/Interventions: Self-care/ADL training;Therapeutic exercise;Energy conservation;DME and/or AE instruction;Therapeutic activities;Patient/family education    OT Goals(Current goals can be found in the care plan section) Acute Rehab OT Goals Patient Stated Goal: go home today! OT Goal Formulation: With patient Time For Goal Achievement: 04/24/16 Potential to Achieve Goals: Good ADL Goals Pt/caregiver will Perform Home Exercise Program: Increased ROM;Right Upper extremity;With written HEP provided (Elbow, wrist, hand ONLY) Additional ADL Goal #1: Pt will be independent with donning/doffing of sling Additional ADL Goal #2: Pt will be independent with functional mobility/ambulation during ADL  OT Frequency: Min 2X/week   Barriers to D/C: None known at this time   End of Session Equipment Utilized During Treatment: Other (comment) (sling) Nurse Communication: Mobility status;Other (comment) (ready to D/C from OT standpoint)  Activity Tolerance: Patient tolerated treatment well Patient left: in chair;with call bell/phone within reach   Time: 0826-0856 OT Time Calculation (min): 30 min Charges:  OT General Charges $OT Visit: 1 Procedure OT Evaluation $OT Eval Low Complexity: 1 Procedure OT Treatments $Therapeutic Exercise: 8-22 mins  Edwin CapPatricia Gale Klar , MS, OTR/L, CLT Pager: [phone]  04/17/2016, 9:36 AM

## 2016-04-17 NOTE — Care Management (Signed)
Patient s/p right reverse shoulder arthroplasty, has no home health or DME needs.

## 2016-06-23 ENCOUNTER — Telehealth: Payer: Self-pay | Admitting: Orthopedic Surgery

## 2016-06-23 NOTE — Telephone Encounter (Signed)
Patient called, states had a fall from a ladder 2 days ago, and asked if he may see Dr Romeo AppleHarrison today.  York SpanielSaid he has not yet had any treatment, but not feeling better and "thinks he may have a broken rib".  Relayed that he unfortunately does not have any available appointments today, and discussed Dr Mort SawyersHarrison's protocol regarding falls - recommend that patient go on to the Emergency room for work-up.  Patient said will do this today; aware we will be glad to schedule accordingly.

## 2017-07-09 ENCOUNTER — Emergency Department (HOSPITAL_COMMUNITY)
Admission: EM | Admit: 2017-07-09 | Discharge: 2017-07-09 | Disposition: A | Discharge status: Home / Self Care | Payer: Medicare Other | Attending: Emergency Medicine | Admitting: Emergency Medicine

## 2017-07-09 ENCOUNTER — Encounter (HOSPITAL_COMMUNITY): Payer: Self-pay

## 2017-07-09 DIAGNOSIS — Z87891 Personal history of nicotine dependence: Secondary | ICD-10-CM | POA: Insufficient documentation

## 2017-07-09 DIAGNOSIS — Z79899 Other long term (current) drug therapy: Secondary | ICD-10-CM | POA: Diagnosis not present

## 2017-07-09 DIAGNOSIS — R55 Syncope and collapse: Secondary | ICD-10-CM | POA: Diagnosis not present

## 2017-07-09 DIAGNOSIS — I10 Essential (primary) hypertension: Secondary | ICD-10-CM | POA: Insufficient documentation

## 2017-07-09 DIAGNOSIS — N179 Acute kidney failure, unspecified: Secondary | ICD-10-CM | POA: Diagnosis not present

## 2017-07-09 DIAGNOSIS — I2510 Atherosclerotic heart disease of native coronary artery without angina pectoris: Secondary | ICD-10-CM | POA: Insufficient documentation

## 2017-07-09 DIAGNOSIS — E876 Hypokalemia: Secondary | ICD-10-CM | POA: Diagnosis not present

## 2017-07-09 LAB — CBC WITH DIFFERENTIAL/PLATELET
BASOS ABS: 0 10*3/uL (ref 0.0–0.1)
Basophils Relative: 0 %
EOS ABS: 0.1 10*3/uL (ref 0.0–0.7)
EOS PCT: 1 %
HCT: 38.6 % — ABNORMAL LOW (ref 39.0–52.0)
Hemoglobin: 12.9 g/dL — ABNORMAL LOW (ref 13.0–17.0)
Lymphocytes Relative: 13 %
Lymphs Abs: 0.9 10*3/uL (ref 0.7–4.0)
MCH: 28.4 pg (ref 26.0–34.0)
MCHC: 33.4 g/dL (ref 30.0–36.0)
MCV: 84.8 fL (ref 78.0–100.0)
MONO ABS: 1.1 10*3/uL — AB (ref 0.1–1.0)
Monocytes Relative: 17 %
Neutro Abs: 4.6 10*3/uL (ref 1.7–7.7)
Neutrophils Relative %: 69 %
PLATELETS: 157 10*3/uL (ref 150–400)
RBC: 4.55 MIL/uL (ref 4.22–5.81)
RDW: 13.8 % (ref 11.5–15.5)
WBC: 6.6 10*3/uL (ref 4.0–10.5)

## 2017-07-09 LAB — BASIC METABOLIC PANEL
Anion gap: 10 (ref 5–15)
BUN: 21 mg/dL — ABNORMAL HIGH (ref 6–20)
CALCIUM: 8.2 mg/dL — AB (ref 8.9–10.3)
CO2: 25 mmol/L (ref 22–32)
Chloride: 99 mmol/L — ABNORMAL LOW (ref 101–111)
Creatinine, Ser: 1.53 mg/dL — ABNORMAL HIGH (ref 0.61–1.24)
GFR calc Af Amer: 48 mL/min — ABNORMAL LOW (ref >60)
GFR, EST NON AFRICAN AMERICAN: 42 mL/min — AB (ref >60)
GLUCOSE: 103 mg/dL — AB (ref 65–99)
POTASSIUM: 3.2 mmol/L — AB (ref 3.5–5.1)
SODIUM: 134 mmol/L — AB (ref 135–145)

## 2017-07-09 LAB — TROPONIN I

## 2017-07-09 MED ORDER — SODIUM CHLORIDE 0.9 % IV BOLUS (SEPSIS)
500.0000 mL | Freq: Once | INTRAVENOUS | Status: AC
  Administered 2017-07-09: 500 mL via INTRAVENOUS

## 2017-07-09 MED ORDER — POTASSIUM CHLORIDE CRYS ER 20 MEQ PO TBCR
40.0000 meq | EXTENDED_RELEASE_TABLET | Freq: Once | ORAL | Status: AC
  Administered 2017-07-09: 40 meq via ORAL
  Filled 2017-07-09: qty 2

## 2017-07-09 NOTE — ED Provider Notes (Signed)
[facility] Provider Note   CSN: [id number] Arrival date & time: 07/09/17  1856     History   Chief Complaint Chief Complaint  Patient presents with  . Loss of Consciousness    HPI JOVONI BORKENHAGEN is a 76 y.o. male.  Patient with history of high blood pressure, cholesterol presents after near syncopal episode. Patient is at a gathering with his friends and he suddenly felt lightheaded and almost passed out. Patient was helped to the ground by family. Patient denies history of similar. No history of valve or cardiac issues per patient. Patient has had diarrhea recently mild fatigue and possibly decreased oral intake today. No chest pain or shortness of breath. Patient walks daily and has no symptoms with this. Patient received 1 L fluid from EMS and feels improved. Currently no symptoms. No blood in the stools.      Past Medical History:  Diagnosis Date  . Coronary artery disease    does not see a cardiologist, reports no cardiac symptoms  . Hyperlipidemia   . Hypertension   . Seasonal allergies     Patient Active Problem List   Diagnosis Date Noted  . 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 Lumbosacral spondylosis without myelopathy 07/11/2013  . S/P lumbar fusion 07/11/2013  . Back pain, chronic 09/19/2012  . Leg weakness, bilateral 09/19/2012  . Difficulty in walking(719.7) 09/19/2012  . Tendonitis of shoulder, right 10/26/2011  . Vertigo 10/26/2011  . Pain in joint, shoulder region 10/26/2011  . Shoulder weakness 10/26/2011    Past Surgical History:  Procedure Laterality Date  . BACK SURGERY     fusion,rods and cage  . COLONOSCOPY  07/31/2011   Procedure: COLONOSCOPY;  Surgeon: Dalia Heading;  Location: [facility];  Service: Gastroenterology;  Laterality: N/A;  . 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  . 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   Procedure: RIGHT REVERSE SHOULDER ARTHROPLASTY;  Surgeon: Jones Broom,  MD;  Location: [facility];  Service: Orthopedics;  Laterality: Right;  Right reverse total shoulder       Home Medications    Prior to Admission medications   Medication Sig Start Date End Date Taking? Authorizing Provider  donepezil (ARICEPT) 10 MG tablet Take 10 mg by mouth at bedtime.   Yes [provider]  fexofenadine (ALLEGRA) 180 MG tablet Take 180 mg by mouth daily.   Yes [provider]  Fluticasone-Salmeterol (ADVAIR) 100-50 MCG/DOSE AEPB Inhale 1 puff into the lungs every 12 (twelve) hours.     Yes [provider]  losartan-hydrochlorothiazide (HYZAAR) 100-12.5 MG tablet Take 1 tablet by mouth daily. 05/24/17  Yes [provider]  rosuvastatin (CRESTOR) 10 MG tablet Take 10 mg by mouth daily.     Yes [provider]  oxyCODONE-acetaminophen (ROXICET) 5-325 MG tablet Take 1-2 tablets by mouth every 4 (four) hours as needed for severe pain. Patient not taking: Reported on 07/09/2017 04/17/16   Jiles Harold, PA-C    Family History Family History  Problem Relation Age of Onset  . Heart disease Unknown   . Asthma Unknown     Social History Social History  Substance Use Topics  . Smoking status: Former Smoker    Packs/day: 1.50    Years: 40.00    Types: Cigarettes    Quit date: 10/12/1990  . Smokeless tobacco: Never Used  . Alcohol use No     Allergies   Patient has no known allergies.   Review of Systems Review of  Systems  Constitutional: Positive for fatigue. Negative for chills and fever.  HENT: Negative for congestion.   Eyes: Negative for visual disturbance.  Respiratory: Negative for shortness of breath.   Cardiovascular: Negative for chest pain.  Gastrointestinal: Negative for abdominal pain and vomiting.  Genitourinary: Negative for dysuria and flank pain.  Musculoskeletal: Negative for back pain, neck pain and neck stiffness.  Skin: Negative for rash.  Neurological: Positive for light-headedness. Negative for  headaches.     Physical Exam Updated Vital Signs BP 126/63   Pulse 64   Temp 97.6 F (36.4 C) (Oral)   Resp 18   Ht 5' 8.5" (1.74 m)   Wt 70.3 kg (155 lb)   SpO2 100%   BMI 23.22 kg/m   Physical Exam  Constitutional: He is oriented to person, place, and time. He appears well-developed and well-nourished.  HENT:  Head: Normocephalic and atraumatic.  Dry mucous membranes  Eyes: Conjunctivae are normal. Right eye exhibits no discharge. Left eye exhibits no discharge.  Neck: Normal range of motion. Neck supple. No tracheal deviation present.  Cardiovascular: Normal rate and regular rhythm.   Pulmonary/Chest: Effort normal and breath sounds normal.  Abdominal: Soft. He exhibits no distension. There is no tenderness. There is no guarding.  Musculoskeletal: He exhibits no edema.  Neurological: He is alert and oriented to person, place, and time.  Skin: Skin is warm. No rash noted.  Psychiatric: He has a normal mood and affect.  Nursing note and vitals reviewed.    ED Treatments / Results  Labs (all labs ordered are listed, but only abnormal results are displayed) Labs Reviewed  CBC WITH DIFFERENTIAL/PLATELET - Abnormal; Notable for the following:       Result Value   Hemoglobin 12.9 (*)    HCT 38.6 (*)    Monocytes Absolute 1.1 (*)    All other components within normal limits  BASIC METABOLIC PANEL - Abnormal; Notable for the following:    Sodium 134 (*)    Potassium 3.2 (*)    Chloride 99 (*)    Glucose, Bld 103 (*)    BUN 21 (*)    Creatinine, Ser 1.53 (*)    Calcium 8.2 (*)    GFR calc non Af Amer 42 (*)    GFR calc Af Amer 48 (*)    All other components within normal limits  TROPONIN I    EKG  EKG Interpretation  Date/Time:  Friday July 09 2017 19:01:09 EDT Ventricular Rate:  63 PR Interval:    QRS Duration: 118 QT Interval:  464 QTC Calculation: 475 R Axis:   28 Text Interpretation:  Sinus rhythm Ventricular premature complex Nonspecific  intraventricular conduction delay Confirmed by Blane Ohara [phone]) on 07/09/2017 7:27:07 PM       Radiology No results found.  Procedures Procedures (including critical care time)  Medications Ordered in ED Medications  potassium chloride SA (K-DUR,KLOR-CON) CR tablet 40 mEq (not administered)  sodium chloride 0.9 % bolus 500 mL (0 mLs Intravenous Stopped 07/09/17 2052)     Initial Impression / Assessment and Plan / ED Course  I have reviewed the triage vital signs and the nursing notes.  Pertinent labs & imaging results that were available during my care of the patient were reviewed by me and considered in my medical decision making (see chart for details).    Patient presents after near syncopal episode. Currently no symptoms. No obvious aortic stenosis murmur on exam however discussed importance of needing echocardiogram in  the near future. Discussed observation the hospital versus close outpatient follow. Plan for oral and IV fluids and reassessment.  Labs consistent with dehydration, ARF.  Pt improved in ED with po and IV fluids.  Results and differential diagnosis were discussed with the patient/parent/guardian. Xrays were independently reviewed by myself.  Close follow up outpatient was discussed, comfortable with the plan.   Medications  potassium chloride SA (K-DUR,KLOR-CON) CR tablet 40 mEq (not administered)  sodium chloride 0.9 % bolus 500 mL (0 mLs Intravenous Stopped 07/09/17 2052)    Vitals:   07/09/17 2000 07/09/17 2015 07/09/17 2030 07/09/17 2045  BP: 109/62  126/63   Pulse: 69 61 69 64  Resp: Temp:      TempSrc:      SpO2: 98% 97% 100% 100%  Weight:      Height:        Final diagnoses:  Near syncope  Hypokalemia  Acute renal failure, unspecified acute renal failure type [facility])     Final Clinical Impressions(s) / ED Diagnoses   Final diagnoses:  Near syncope  Hypokalemia  Acute renal failure, unspecified acute renal failure  type [facility])    New Prescriptions New Prescriptions   No medications on file     Blane Ohara, MD 07/09/17 2104

## 2017-07-09 NOTE — ED Triage Notes (Addendum)
Pt was at a gathering this evening when he stepped outside and got dizzy, passed out but did not hit the ground, was helped to the ground by family.  Pt denies pain or other complaints, states he still feels a little dizzy. EMS obtained initial blood pressure 80/40.   Pt has received 1 liter bolus of Normal Saline per ems

## 2017-07-09 NOTE — ED Notes (Signed)
Pt tolerating fluids well. 

## 2017-07-09 NOTE — Discharge Instructions (Addendum)
STay well-hydrated. Discuss ultrasound of your heart with your primary doctor Monday. Have electrolytes/ kidney function blood work rechecked next week Return for worsening symptoms over the weekend or chest pain.

## 2017-08-03 MED FILL — Sodium Chloride IV Soln 0.9%: INTRAVENOUS | Qty: 500 | Status: AC

## 2017-12-14 IMAGING — MR MR SHOULDER*R* W/O CM
5 series · 34 of 40 positions shown · non-contrast
Comparison: Radiographs 12/04/2015

CLINICAL DATA: Two year history of right shoulder pain and limited
range of motion.

EXAM:
MRI OF THE RIGHT SHOULDER WITHOUT CONTRAST
TECHNIQUE: Multiplanar, multisequence MR imaging of the shoulder was performed.
No intravenous contrast was administered.

[Series 3: T2 fat-sat · axial · 4.0mm · 0.55mm/px · z∈[-28,+66]mm · 8 of 21 slices shown (1 of 3)]
[im 1/21]
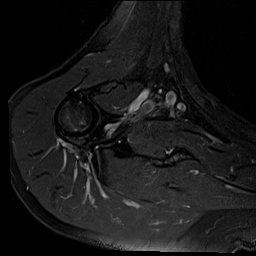
[im 3/21]
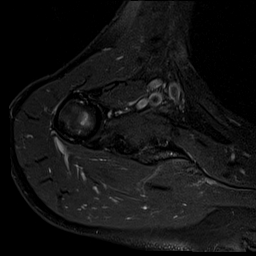
[im 6/21]
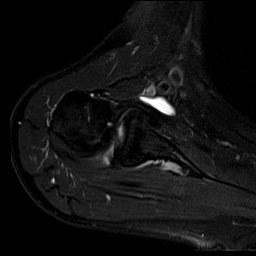
[im 9/21]
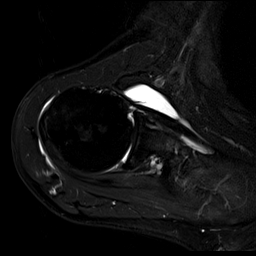
[im 12/21]
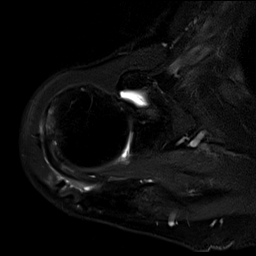
[im 15/21]
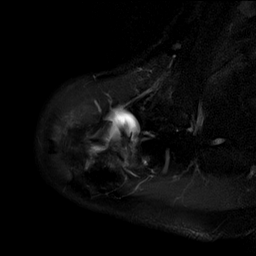
[im 18/21]
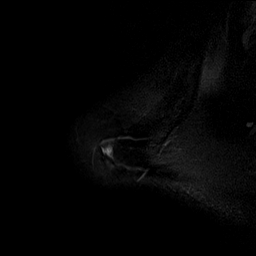
[im 21/21]
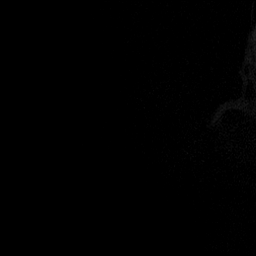

[Series 4: T2 fat-sat · oblique · 4.0mm · 0.55mm/px · 8 of 21 slices shown (2 of 3)]
[im 1/21]
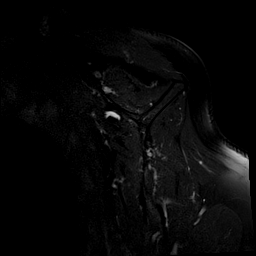
[im 3/21]
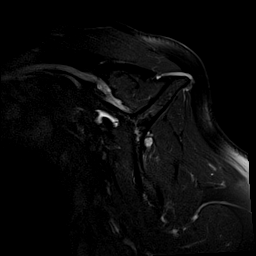
[im 6/21]
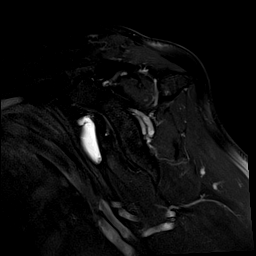
[im 9/21]
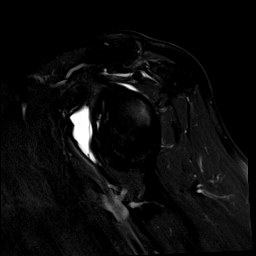
[im 12/21]
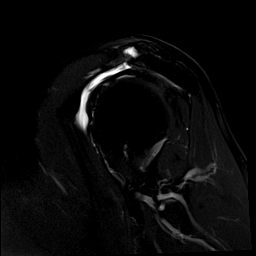
[im 15/21]
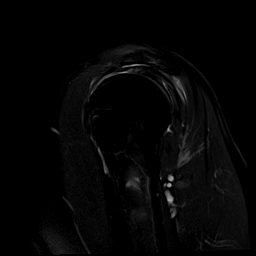
[im 18/21]
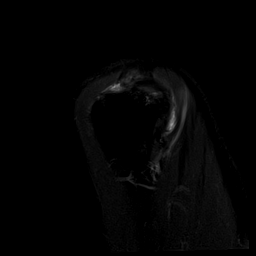
[im 21/21]
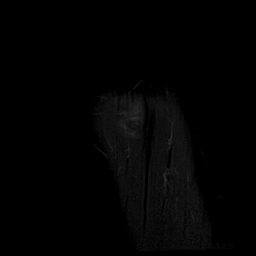

[Series 5: T2 fat-sat · oblique · 4.0mm · 0.27mm/px · 8 of 19 slices shown (3 of 3)]
[im 1/19]
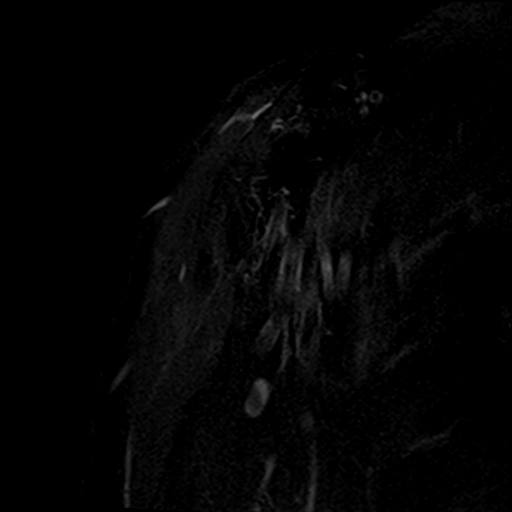
[im 3/19]
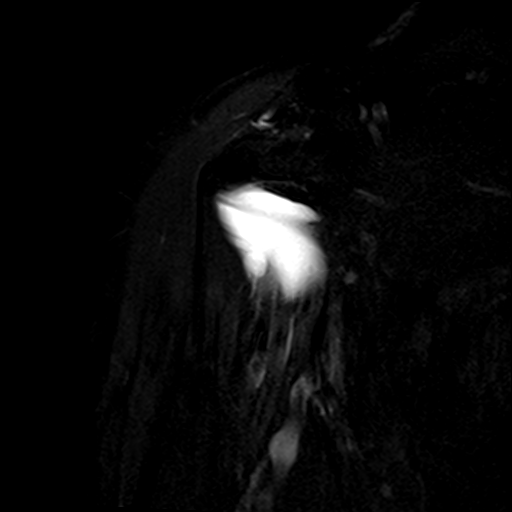
[im 6/19]
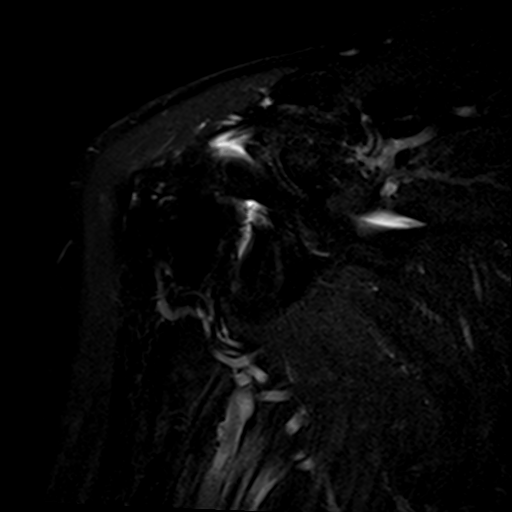
[im 8/19]
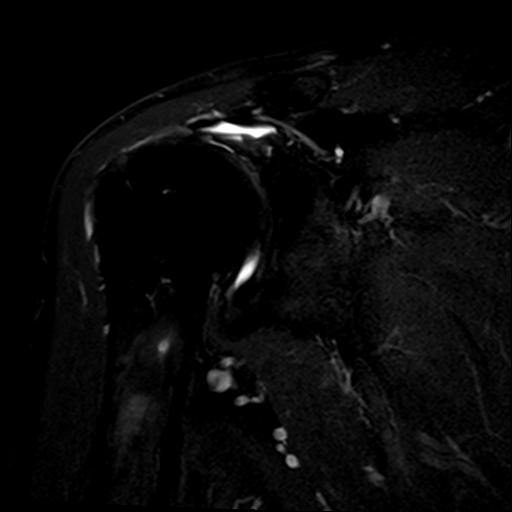
[im 11/19]
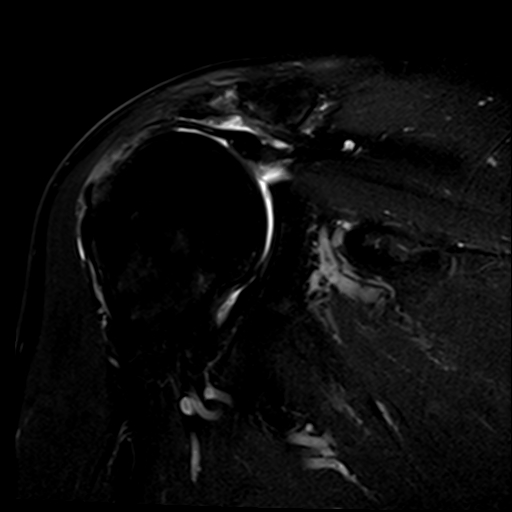
[im 13/19]
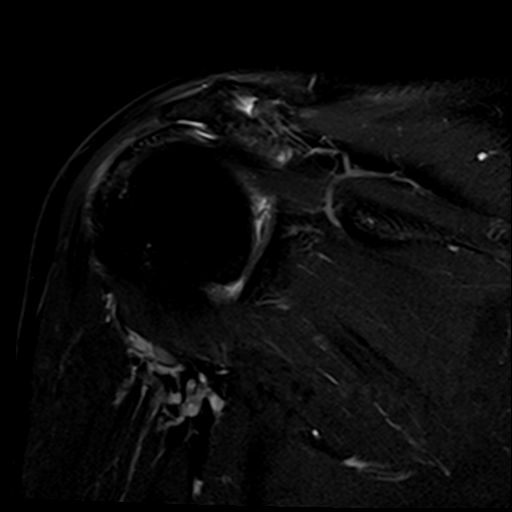
[im 16/19]
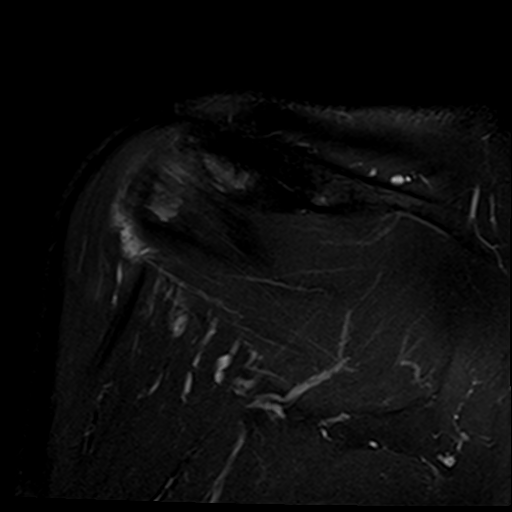
[im 19/19]
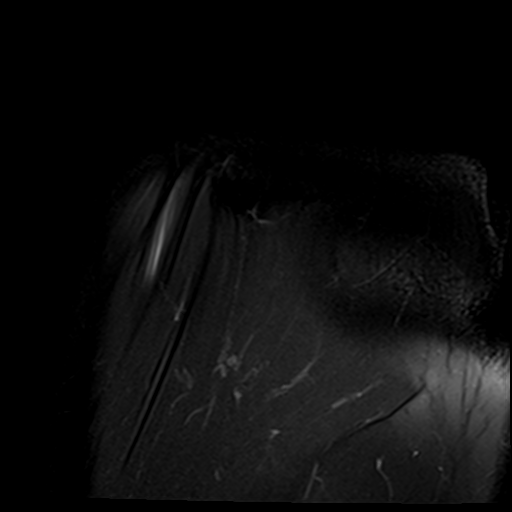

[Series 6: T1 · oblique · 4.0mm · 0.22mm/px · 2 of 21 slices shown]
[im 1/21]
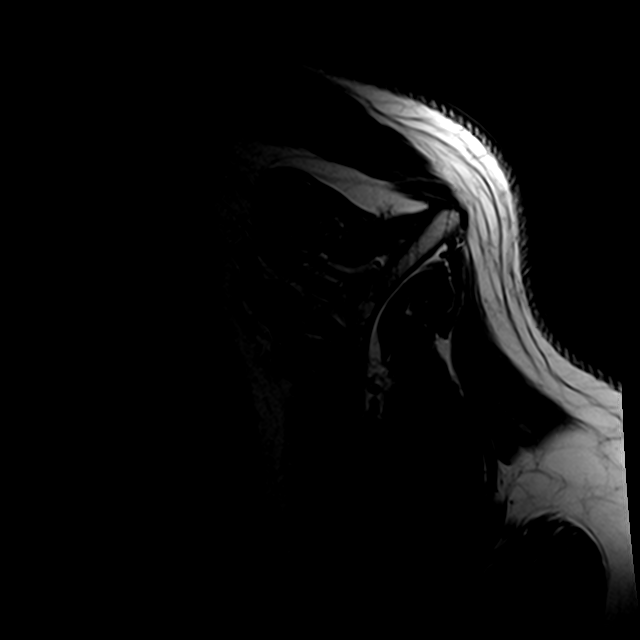
[im 3/21]
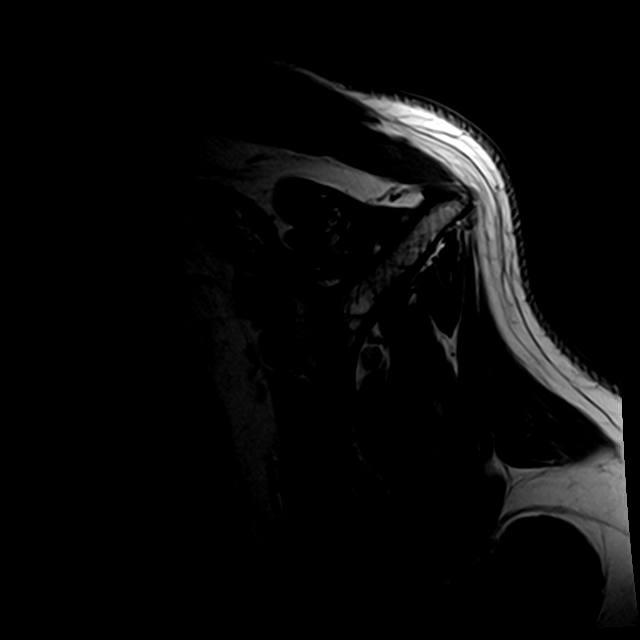

[Series 7: PD · oblique · 4.0mm · 0.44mm/px · 8 of 19 slices shown]
[im 1/19]
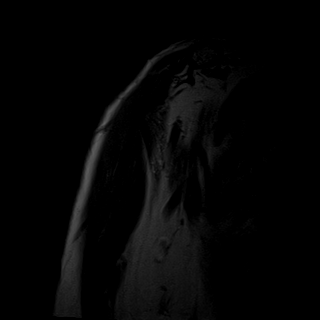
[im 3/19]
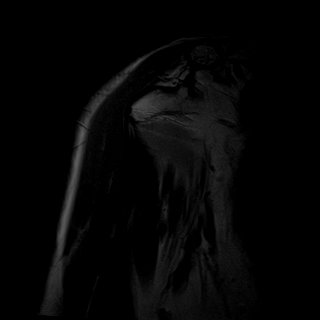
[im 6/19]
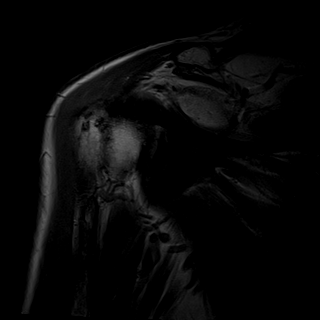
[im 8/19]
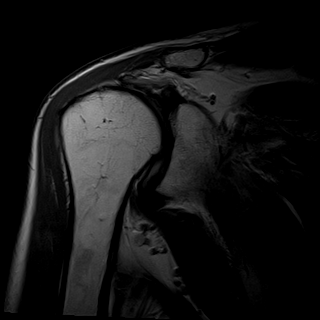
[im 11/19]
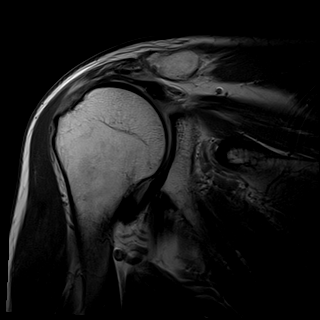
[im 13/19]
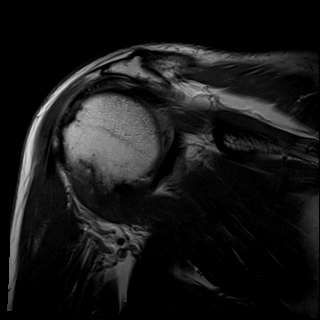
[im 16/19]
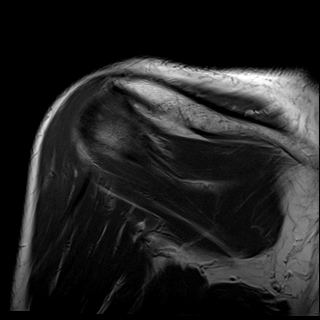
[im 19/19]
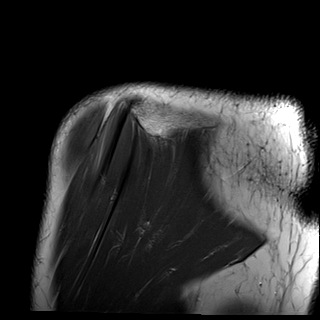

[34 of 40 positions shown; findings below may reference images not displayed]

FINDINGS: Rotator cuff: Full-thickness retracted supraspinatus tendon tear
with approximately 3.3 cm of retraction. The tear is 23 mm wide.
There is moderate tendinopathy/tendinosis involving the
infraspinatus tendon. The subscapularis tendon is markedly thinned
and but appears intact.

Muscles: Mild fatty atrophy of the supraspinatus and subscapularis
muscles.

Biceps long head: The intra-articular portion of the tendon is not
identified. It is likely torn and retracted. There may be a piece of
the biceps tendon which is still attached to the superior labrum.

Acromioclavicular Joint: Moderate degenerative changes. The acromion
is type 2 in shape. No lateral downsloping or undersurface spurring.

Glenohumeral Joint: Mild to moderate glenohumeral joint degenerative
changes. Small joint effusion and mild synovitis.

Labrum: The posterior labrum is degenerated and torn. The superior
alignment is blunted and likely torn.

Bones:  No acute bony findings.
IMPRESSION: 1. Full-thickness retracted supraspinatus tendon tear.
2. Moderate tendinopathy/tendinosis involving the infraspinatus
tendon and marked thinning of the subscapularis tendon.
3. Mild fatty atrophy of the supraspinatus and subscapularis
muscles.
4. Torn and retracted long head biceps tendon.
5. Degenerated and torn superior and posterior labrum.

## 2019-09-12 ENCOUNTER — Other Ambulatory Visit: Payer: Self-pay

## 2019-09-12 DIAGNOSIS — Z20822 Contact with and (suspected) exposure to covid-19: Secondary | ICD-10-CM

## 2019-09-14 LAB — NOVEL CORONAVIRUS, NAA: SARS-CoV-2, NAA: NOT DETECTED

## 2019-12-11 ENCOUNTER — Ambulatory Visit: Payer: Medicare PPO | Admitting: Neurology

## 2019-12-11 ENCOUNTER — Encounter: Payer: Self-pay | Admitting: Neurology

## 2019-12-11 ENCOUNTER — Other Ambulatory Visit: Payer: Self-pay

## 2019-12-11 VITALS — BP 138/87 | HR 76 | Ht 70.0 in | Wt 149.0 lb

## 2019-12-11 DIAGNOSIS — E538 Deficiency of other specified B group vitamins: Secondary | ICD-10-CM

## 2019-12-11 DIAGNOSIS — R413 Other amnesia: Secondary | ICD-10-CM | POA: Diagnosis not present

## 2019-12-11 MED ORDER — MEMANTINE HCL 10 MG PO TABS: 10.0000 mg | ORAL_TABLET | Freq: Two times a day (BID) | ORAL | 3 refills | Status: DC

## 2019-12-11 NOTE — Progress Notes (Signed)
Reason for visit: Memory disturbance  Referring physician: Dr. Steva Ready Michael [name] is a 79 y.o. male  History of present illness:  Michael [name] is an 79 year old right-handed white male with a history of some problems with memory since at or before 2014.  The patient has had slow progression of his memory over time, he has been on Aricept for a number of years and recently was placed on Namenda.  The patient comes in with his wife today.  The patient is still performing all of his activities of daily living, he reports minimal problems with names, he does have some short-term memory issues and may repeat himself at times.  Occasionally he may misplace things about the house.  He is still operating a motor vehicle without difficulty, he is able to do his own finances and keep up with his own medications and appointments.  He denies any problems with hallucinations or confusion, he has a good energy level during the day.  The sleeps well at night and tries to stay active during the day usually walking 2 or 3 miles a day.  The patient denies any balance issues or difficulty controlling the bowels or the bladder.  He reports no numbness or weakness of the face, arms, legs.  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  He claims that his mother and his older sister has dementia.  His mother had a stroke that started the onset of her memory problem.  Past Medical History:  Diagnosis Date  . Coronary artery disease    does not see a cardiologist, reports no cardiac symptoms  . Hyperlipidemia   . Hypertension   . Seasonal allergies     Past Surgical History:  Procedure Laterality Date  . BACK SURGERY     fusion,rods and cage  . COLONOSCOPY  07/31/2011   Procedure: COLONOSCOPY;  Surgeon: Jamesetta So;  [facility];  Service: Gastroenterology;  Laterality: N/A;  . 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  . REVERSE  SHOULDER ARTHROPLASTY Right 04/16/2016   Procedure: RIGHT REVERSE SHOULDER ARTHROPLASTY;  Surgeon: Tania Ade, MD;  Location: [facility];  Service: Orthopedics;  Laterality: Right;  Right reverse total shoulder    Family History  Problem Relation Age of Onset  . Heart disease Unknown   . Asthma Unknown     Social history:  reports that he quit smoking about 29 years ago. His smoking use included cigarettes. He has a 60.00 pack-year smoking history. He has never used smokeless tobacco. He reports that he does not drink alcohol or use drugs.  Medications:  Prior to Admission medications   Medication Sig Start Date End Date Taking? Authorizing Provider  donepezil (ARICEPT) 10 MG tablet Take 10 mg by mouth at bedtime.   Yes [provider]  Fluticasone-Salmeterol (ADVAIR) 100-50 MCG/DOSE AEPB Inhale 1 puff into the lungs every 12 (twelve) hours.     Yes [provider]  levocetirizine (XYZAL) 5 MG tablet Take 5 mg by mouth every evening.   Yes [provider]  losartan-hydrochlorothiazide (HYZAAR) 100-12.5 MG tablet Take 1 tablet by mouth daily. 05/24/17  Yes [provider]  memantine (NAMENDA) 10 MG tablet Take 10 mg by mouth daily.   Yes [provider]  rosuvastatin (CRESTOR) 10 MG tablet Take 10 mg by mouth daily.     Yes [provider]     No Known Allergies  ROS:  Out of a complete 14 system review of symptoms, the patient complains only of the following symptoms, and all other reviewed systems are negative.  Memory disturbance Decreased vision, left eye Feeling cold Muscle cramps  Blood pressure 138/87, pulse 76, height 5\' 10"  (1.778 m), weight 149 lb (67.6 kg).  Physical Exam  General: The patient is alert and cooperative at the time of the examination.  Eyes: Pupils are anisocoric, the left pupil being about 3 to 4 mm, the right pupil 2 to 3 mm. Discs are flat bilaterally.  Neck: The neck is supple, no carotid bruits  are noted.  Respiratory: The respiratory examination is clear.  Cardiovascular: The cardiovascular examination reveals a regular rate and rhythm, no obvious murmurs or rubs are noted.  Skin: Extremities are without significant edema.  Neurologic Exam  Mental status: The patient is alert and oriented x 2 at the time of the examination (not oriented to date). The Mini-Mental Status Examination done today shows a total score of 19/30.  Cranial nerves: Facial symmetry is present. There is good sensation of the face to pinprick and soft touch bilaterally. The strength of the facial muscles and the muscles to head turning and shoulder shrug are normal bilaterally. Speech is well enunciated, no aphasia or dysarthria is noted. Extraocular movements are full. Visual fields are full. The tongue is midline, and the patient has symmetric elevation of the soft palate. No obvious hearing deficits are noted.  Motor: The motor testing reveals 5 over 5 strength of all 4 extremities. Good symmetric motor tone is noted throughout.  Sensory: Sensory testing is intact to pinprick, soft touch, vibration sensation, and position sense on all 4 extremities. No evidence of extinction is noted.  Coordination: Cerebellar testing reveals good finger-nose-finger and heel-to-shin bilaterally.  Gait and station: Gait is slightly wide-based. Tandem gait is unsteady. Romberg is negative. No drift is seen.  Reflexes: Deep tendon reflexes are symmetric and normal bilaterally. Toes are downgoing bilaterally.   MRI brain 02/14/13:   IMPRESSION: Age appropriate atrophy, stable.  Mild chronic microvascular ischemic change in the white matter, with mild progression.  No acute infarct.  * MRI scan images were reviewed online. I agree with the written report.    Assessment/Plan:  1.  Progressive memory disturbance  The patient does have a family history of dementia.  The patient has had a slow progression of memory  problems over the last 6 or 7 years.  He is on Aricept 10 mg daily, he is only taking 10 mg daily of Namenda, we will increase this dose to 15 mg daily for 1 week and then go to 10 mg twice daily.  A prescription was sent in.  The driving issue will need to be scrutinized closely.  The patient will have blood work today, he will follow-up here in 6 months.  04/16/13 MD 12/11/2019 11:59 AM  [facility] [address] [place], [place]  Phone [phone] Fax [phone]

## 2019-12-11 NOTE — Patient Instructions (Signed)
We will go up on the Nemenda to 5 mg (1/2 tablet) in the morning and 10 mg (full tablet) at night for one week, then convert to one full tablet twice a day.

## 2019-12-12 LAB — VITAMIN B12: Vitamin B-12: 266 pg/mL (ref 232–1245)

## 2019-12-12 LAB — RPR: RPR Ser Ql: NONREACTIVE

## 2019-12-14 DIAGNOSIS — L570 Actinic keratosis: Secondary | ICD-10-CM | POA: Diagnosis not present

## 2019-12-14 DIAGNOSIS — X32XXXD Exposure to sunlight, subsequent encounter: Secondary | ICD-10-CM | POA: Diagnosis not present

## 2019-12-28 DIAGNOSIS — G309 Alzheimer's disease, unspecified: Secondary | ICD-10-CM | POA: Diagnosis not present

## 2019-12-28 DIAGNOSIS — J449 Chronic obstructive pulmonary disease, unspecified: Secondary | ICD-10-CM | POA: Diagnosis not present

## 2020-01-02 DIAGNOSIS — H33022 Retinal detachment with multiple breaks, left eye: Secondary | ICD-10-CM | POA: Diagnosis not present

## 2020-01-02 DIAGNOSIS — Z961 Presence of intraocular lens: Secondary | ICD-10-CM | POA: Diagnosis not present

## 2020-01-02 DIAGNOSIS — H35371 Puckering of macula, right eye: Secondary | ICD-10-CM | POA: Diagnosis not present

## 2020-05-07 DIAGNOSIS — H33022 Retinal detachment with multiple breaks, left eye: Secondary | ICD-10-CM | POA: Diagnosis not present

## 2020-05-07 DIAGNOSIS — H35371 Puckering of macula, right eye: Secondary | ICD-10-CM | POA: Diagnosis not present

## 2020-05-07 DIAGNOSIS — Z961 Presence of intraocular lens: Secondary | ICD-10-CM | POA: Diagnosis not present

## 2020-05-17 DIAGNOSIS — Z79899 Other long term (current) drug therapy: Secondary | ICD-10-CM | POA: Diagnosis not present

## 2020-05-17 DIAGNOSIS — J449 Chronic obstructive pulmonary disease, unspecified: Secondary | ICD-10-CM | POA: Diagnosis not present

## 2020-05-17 DIAGNOSIS — F1021 Alcohol dependence, in remission: Secondary | ICD-10-CM | POA: Diagnosis not present

## 2020-05-17 DIAGNOSIS — E785 Hyperlipidemia, unspecified: Secondary | ICD-10-CM | POA: Diagnosis not present

## 2020-05-17 DIAGNOSIS — R413 Other amnesia: Secondary | ICD-10-CM | POA: Diagnosis not present

## 2020-05-17 DIAGNOSIS — I10 Essential (primary) hypertension: Secondary | ICD-10-CM | POA: Diagnosis not present

## 2020-05-24 DIAGNOSIS — E785 Hyperlipidemia, unspecified: Secondary | ICD-10-CM | POA: Diagnosis not present

## 2020-05-24 DIAGNOSIS — Z0001 Encounter for general adult medical examination with abnormal findings: Secondary | ICD-10-CM | POA: Diagnosis not present

## 2020-05-24 DIAGNOSIS — R001 Bradycardia, unspecified: Secondary | ICD-10-CM | POA: Diagnosis not present

## 2020-05-24 DIAGNOSIS — F1021 Alcohol dependence, in remission: Secondary | ICD-10-CM | POA: Diagnosis not present

## 2020-05-24 DIAGNOSIS — I10 Essential (primary) hypertension: Secondary | ICD-10-CM | POA: Diagnosis not present

## 2020-05-24 DIAGNOSIS — R413 Other amnesia: Secondary | ICD-10-CM | POA: Diagnosis not present

## 2020-06-13 DIAGNOSIS — X32XXXD Exposure to sunlight, subsequent encounter: Secondary | ICD-10-CM | POA: Diagnosis not present

## 2020-06-13 DIAGNOSIS — L570 Actinic keratosis: Secondary | ICD-10-CM | POA: Diagnosis not present

## 2020-06-24 ENCOUNTER — Other Ambulatory Visit: Payer: Self-pay

## 2020-06-24 ENCOUNTER — Ambulatory Visit: Payer: Medicare PPO | Admitting: Neurology

## 2020-06-24 ENCOUNTER — Encounter: Payer: Self-pay | Admitting: Neurology

## 2020-06-24 DIAGNOSIS — F0390 Unspecified dementia without behavioral disturbance: Secondary | ICD-10-CM | POA: Diagnosis not present

## 2020-06-24 MED ORDER — MEMANTINE HCL 10 MG PO TABS: 10.0000 mg | ORAL_TABLET | Freq: Two times a day (BID) | ORAL | 3 refills | Status: DC

## 2020-06-24 NOTE — Progress Notes (Signed)
PATIENT: Michael [name] DOB: [birth year]  REASON FOR VISIT: follow up HISTORY FROM: patient  HISTORY OF PRESENT ILLNESS: Today 06/24/20 Michael [name] is 79 year old male with history of memory disturbance with strong family history.  He is on Aricept and Namenda. Tolerating well. Has adjusted to twice daily dosing, he manages his medications well.  Memory seems overall stable, has a gradual decline over last several years. He makes lots of notes.  He splits the bills with his wife, does this well.  They have 2 sons who are willing to help.  He has a good appetite and sleeps well.  No falls.  He walks his dog, Sugar, daily 2-3 miles.  He does limited driving in [place], wife does most, does not get lost.  He has limited vision in the left eye. He does his own ADLs, cuts the grass.  RPR, B12 were unremarkable.  Presents today for evaluation accompanied by his wife.  HISTORY 12/11/2019 Dr. Anne Hahn: Michael [name] is an 79 year old right-handed white male with a history of some problems with memory since at or before 2014.  The patient has had slow progression of his memory over time, he has been on Aricept for a number of years and recently was placed on Namenda.  The patient comes in with his wife today.  The patient is still performing all of his activities of daily living, he reports minimal problems with names, he does have some short-term memory issues and may repeat himself at times.  Occasionally he may misplace things about the house.  He is still operating a motor vehicle without difficulty, he is able to do his own finances and keep up with his own medications and appointments.  He denies any problems with hallucinations or confusion, he has a good energy level during the day.  The sleeps well at night and tries to stay active during the day usually walking 2 or 3 miles a day.  The patient denies any balance issues or difficulty controlling the bowels or the bladder.  He reports no numbness or  weakness of the face, arms, legs.  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  He claims that his mother and his older sister has dementia.  His mother had a stroke that started the onset of her memory problem.   REVIEW OF SYSTEMS: Out of a complete 14 system review of symptoms, the patient complains only of the following symptoms, and all other reviewed systems are negative.  Memory loss  ALLERGIES: No Known Allergies  HOME MEDICATIONS: Outpatient Medications Prior to Visit  Medication Sig Dispense Refill  . brimonidine (ALPHAGAN) 0.2 % ophthalmic solution 1 drop in the morning, at noon, and at bedtime.    . donepezil (ARICEPT) 10 MG tablet Take 10 mg by mouth at bedtime.    . dorzolamide-timolol (COSOPT) 22.3-6.8 MG/ML ophthalmic solution Place 1 drop into the left eye 2 times daily.    . Fluticasone-Salmeterol (ADVAIR) 100-50 MCG/DOSE AEPB Inhale 1 puff into the lungs every 12 (twelve) hours.      Marland Kitchen levocetirizine (XYZAL) 5 MG tablet Take 5 mg by mouth every evening.    Marland Kitchen losartan-hydrochlorothiazide (HYZAAR) 100-12.5 MG tablet Take 1 tablet by mouth daily.  0  . memantine (NAMENDA) 10 MG tablet Take 1 tablet (10 mg total) by mouth 2 (two) times daily. 180 tablet 3  . rosuvastatin (CRESTOR) 10 MG tablet Take 10 mg by mouth daily.  No facility-administered medications prior to visit.    PAST MEDICAL HISTORY: Past Medical History:  Diagnosis Date  . Coronary artery disease    does not see a cardiologist, reports no cardiac symptoms  . Hyperlipidemia   . Hypertension   . Seasonal allergies     PAST SURGICAL HISTORY: Past Surgical History:  Procedure Laterality Date  . BACK SURGERY     fusion,rods and cage  . COLONOSCOPY  07/31/2011   Procedure: COLONOSCOPY;  Surgeon: Dalia Heading;  [facility];  Service: Gastroenterology;  Laterality: N/A;  . 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  . REVERSE  SHOULDER ARTHROPLASTY Right 04/16/2016   Procedure: RIGHT REVERSE SHOULDER ARTHROPLASTY;  Surgeon: Jones Broom, MD;  Location: [facility];  Service: Orthopedics;  Laterality: Right;  Right reverse total shoulder    FAMILY HISTORY: Family History  Problem Relation Age of Onset  . Heart disease Unknown   . Asthma Unknown     SOCIAL HISTORY: Social History   Socioeconomic History  . Marital status: Married    Spouse name: Not on file  . Number of children: Not on file  . Years of education: college  . Highest education level: Not on file  Occupational History  . Occupation: retired    Associate Professor: RETIRED  Tobacco Use  . Smoking status: Former Smoker    Packs/day: 1.50    Years: 40.00    Pack years: 60.00    Types: Cigarettes    Quit date: 10/12/1990    Years since quitting: 29.7  . Smokeless tobacco: Never Used  Substance and Sexual Activity  . Alcohol use: No  . Drug use: No  . Sexual activity: Not on file  Other Topics Concern  . Not on file  Social History Narrative  . Not on file   Social Determinants of Health   Financial Resource Strain:   . Difficulty of Paying Living Expenses: Not on file  Food Insecurity:   . Worried About Programme researcher, broadcasting/film/video in the Last Year: Not on file  . Ran Out of Food in the Last Year: Not on file  Transportation Needs:   . Lack of Transportation (Medical): Not on file  . Lack of Transportation (Non-Medical): Not on file  Physical Activity:   . Days of Exercise per Week: Not on file  . Minutes of Exercise per Session: Not on file  Stress:   . Feeling of Stress : Not on file  Social Connections:   . Frequency of Communication with Friends and Family: Not on file  . Frequency of Social Gatherings with Friends and Family: Not on file  . Attends Religious Services: Not on file  . Active Member of Clubs or Organizations: Not on file  . Attends Banker Meetings: Not on file  . Marital Status: Not on file  Intimate Partner  Violence:   . Fear of Current or Ex-Partner: Not on file  . Emotionally Abused: Not on file  . Physically Abused: Not on file  . Sexually Abused: Not on file   PHYSICAL EXAM  Vitals:   06/24/20 1007  BP: 123/71  Pulse: (!) 45  Weight: 146 lb 3.2 oz (66.3 kg)  Height: 5\' 10"  (1.778 m)   Body mass index is 20.98 kg/m.  Generalized: Well developed, in no acute distress  MMSE - Mini Mental State Exam 06/24/2020 12/11/2019  Orientation to time 4 0  Orientation to Place 3 5  Registration 3 3  Attention/ Calculation 1  1  Recall 0 1  Language- name 2 objects 2 2  Language- repeat 1 1  Language- follow 3 step command 3 3  Language- read & follow direction 1 1  Write a sentence 1 1  Copy design 1 1  Total score 20 19    Neurological examination  Mentation: Alert oriented to time, place, history taking. Follows all commands speech and language fluent Cranial nerve II-XII: Pupils were equal round reactive to light. Extraocular movements were full, visual field were full on confrontational test. Facial sensation and strength were normal.  Head turning and shoulder shrug  were normal and symmetric. Motor: The motor testing reveals 5 over 5 strength of all 4 extremities. Good symmetric motor tone is noted throughout.  Sensory: Sensory testing is intact to soft touch on all 4 extremities. No evidence of extinction is noted.  Coordination: Cerebellar testing reveals good finger-nose-finger and heel-to-shin bilaterally.  Gait and station: Gait is slightly wide-based but steady.  No assistive device. Reflexes: Deep tendon reflexes are symmetric and normal bilaterally.   DIAGNOSTIC DATA (LABS, IMAGING, TESTING) - I reviewed patient records, labs, notes, testing and imaging myself where available.  Lab Results  Component Value Date   WBC 6.6 07/09/2017   HGB 12.9 (L) 07/09/2017   HCT 38.6 (L) 07/09/2017   MCV 84.8 07/09/2017   PLT 157 07/09/2017      Component Value Date/Time   NA 134  (L) 07/09/2017 1915   K 3.2 (L) 07/09/2017 1915   CL 99 (L) 07/09/2017 1915   CO2 25 07/09/2017 1915   GLUCOSE 103 (H) 07/09/2017 1915   BUN 21 (H) 07/09/2017 1915   CREATININE 1.53 (H) 07/09/2017 1915   CALCIUM 8.2 (L) 07/09/2017 1915   PROT 6.6 04/08/2016 1054   ALBUMIN 4.5 04/08/2016 1054   AST 20 04/08/2016 1054   ALT 11 (L) 04/08/2016 1054   ALKPHOS 44 04/08/2016 1054   BILITOT 1.2 04/08/2016 1054   GFRNONAA 42 (L) 07/09/2017 1915   GFRAA 48 (L) 07/09/2017 1915   No results found for: CHOL, HDL, LDLCALC, LDLDIRECT, TRIG, CHOLHDL No results found for: IRCV8L Lab Results  Component Value Date   VITAMINB12 266 12/11/2019   No results found for: TSH    ASSESSMENT AND PLAN 79 y.o. year old male  has a past medical history of Coronary artery disease, Hyperlipidemia, Hypertension, and Seasonal allergies. here with:  1.  Progressive memory disturbance  -Memory has remained overall stable, MMSE 20/30 today, is overall very pleasant -Continue Aricept 10 mg at bedtime -Continue Namenda 10 mg twice a day -The driving will need to be closely scrutinized -Encouraged to continue to remain active -Continue routine follow-up with PCP -Follow-up in 1 year or sooner if needed  I spent 30 minutes of face-to-face and non-face-to-face time with patient.  This included previsit chart review, lab review, study review, order entry, electronic health record documentation, patient education.  Margie Ege, AGNP-C, DNP 06/24/2020, 10:26 AM [facility] [address] [place], [place] [address] [phone]

## 2020-06-24 NOTE — Progress Notes (Signed)
I have read the note, and I agree with the clinical assessment and plan.  Nickola Lenig K Lorretta Kerce   

## 2020-06-24 NOTE — Patient Instructions (Addendum)
Continue current medications at current dosages  Monitor the driving closely  Memory score is overall stable  See you back in 1 year

## 2020-06-27 ENCOUNTER — Ambulatory Visit: Payer: Self-pay | Attending: Internal Medicine

## 2020-06-27 DIAGNOSIS — Z23 Encounter for immunization: Secondary | ICD-10-CM

## 2020-06-27 NOTE — Progress Notes (Signed)
   Covid-19 Vaccination Clinic  Name:  AIJALON KIRTZ    MRN: [id number] DOB: [birth year]  06/27/2020  Mr. Pappalardo was observed post Covid-19 immunization for 15 minutes without incident. He was provided with Vaccine Information Sheet and instruction to access the V-Safe system.   Mr. Deschene was instructed to call 911 with any severe reactions post vaccine: Marland Kitchen Difficulty breathing  . Swelling of face and throat  . A fast heartbeat  . A bad rash all over body  . Dizziness and weakness

## 2020-07-18 DIAGNOSIS — L570 Actinic keratosis: Secondary | ICD-10-CM | POA: Diagnosis not present

## 2020-07-18 DIAGNOSIS — X32XXXD Exposure to sunlight, subsequent encounter: Secondary | ICD-10-CM | POA: Diagnosis not present

## 2020-09-25 DIAGNOSIS — G3184 Mild cognitive impairment, so stated: Secondary | ICD-10-CM | POA: Diagnosis not present

## 2020-09-25 DIAGNOSIS — I10 Essential (primary) hypertension: Secondary | ICD-10-CM | POA: Diagnosis not present

## 2020-09-25 DIAGNOSIS — J40 Bronchitis, not specified as acute or chronic: Secondary | ICD-10-CM | POA: Diagnosis not present

## 2020-10-01 DIAGNOSIS — Z961 Presence of intraocular lens: Secondary | ICD-10-CM | POA: Diagnosis not present

## 2020-10-01 DIAGNOSIS — H33022 Retinal detachment with multiple breaks, left eye: Secondary | ICD-10-CM | POA: Diagnosis not present

## 2020-10-01 DIAGNOSIS — H35371 Puckering of macula, right eye: Secondary | ICD-10-CM | POA: Diagnosis not present

## 2021-01-23 DIAGNOSIS — L570 Actinic keratosis: Secondary | ICD-10-CM | POA: Diagnosis not present

## 2021-01-23 DIAGNOSIS — X32XXXD Exposure to sunlight, subsequent encounter: Secondary | ICD-10-CM | POA: Diagnosis not present

## 2021-02-03 DIAGNOSIS — I10 Essential (primary) hypertension: Secondary | ICD-10-CM | POA: Diagnosis not present

## 2021-02-03 DIAGNOSIS — G309 Alzheimer's disease, unspecified: Secondary | ICD-10-CM | POA: Diagnosis not present

## 2021-05-22 DIAGNOSIS — R7303 Prediabetes: Secondary | ICD-10-CM | POA: Diagnosis not present

## 2021-05-22 DIAGNOSIS — R413 Other amnesia: Secondary | ICD-10-CM | POA: Diagnosis not present

## 2021-05-22 DIAGNOSIS — I10 Essential (primary) hypertension: Secondary | ICD-10-CM | POA: Diagnosis not present

## 2021-05-22 DIAGNOSIS — J449 Chronic obstructive pulmonary disease, unspecified: Secondary | ICD-10-CM | POA: Diagnosis not present

## 2021-05-22 DIAGNOSIS — E785 Hyperlipidemia, unspecified: Secondary | ICD-10-CM | POA: Diagnosis not present

## 2021-05-22 DIAGNOSIS — Z79899 Other long term (current) drug therapy: Secondary | ICD-10-CM | POA: Diagnosis not present

## 2021-05-22 DIAGNOSIS — N183 Chronic kidney disease, stage 3 unspecified: Secondary | ICD-10-CM | POA: Diagnosis not present

## 2021-05-22 DIAGNOSIS — R001 Bradycardia, unspecified: Secondary | ICD-10-CM | POA: Diagnosis not present

## 2021-05-27 DIAGNOSIS — E785 Hyperlipidemia, unspecified: Secondary | ICD-10-CM | POA: Diagnosis not present

## 2021-05-27 DIAGNOSIS — R001 Bradycardia, unspecified: Secondary | ICD-10-CM | POA: Diagnosis not present

## 2021-05-27 DIAGNOSIS — Z6822 Body mass index (BMI) 22.0-22.9, adult: Secondary | ICD-10-CM | POA: Diagnosis not present

## 2021-05-27 DIAGNOSIS — F1021 Alcohol dependence, in remission: Secondary | ICD-10-CM | POA: Diagnosis not present

## 2021-05-27 DIAGNOSIS — E875 Hyperkalemia: Secondary | ICD-10-CM | POA: Diagnosis not present

## 2021-05-27 DIAGNOSIS — I10 Essential (primary) hypertension: Secondary | ICD-10-CM | POA: Diagnosis not present

## 2021-05-27 DIAGNOSIS — N1831 Chronic kidney disease, stage 3a: Secondary | ICD-10-CM | POA: Diagnosis not present

## 2021-05-27 DIAGNOSIS — J449 Chronic obstructive pulmonary disease, unspecified: Secondary | ICD-10-CM | POA: Diagnosis not present

## 2021-06-24 ENCOUNTER — Encounter: Payer: Self-pay | Admitting: Neurology

## 2021-06-24 ENCOUNTER — Ambulatory Visit: Payer: Medicare PPO | Admitting: Neurology

## 2021-06-24 VITALS — BP 122/64 | HR 51 | Ht 68.0 in | Wt 141.5 lb

## 2021-06-24 DIAGNOSIS — F0280 Dementia in other diseases classified elsewhere without behavioral disturbance: Secondary | ICD-10-CM | POA: Diagnosis not present

## 2021-06-24 DIAGNOSIS — G301 Alzheimer's disease with late onset: Secondary | ICD-10-CM | POA: Diagnosis not present

## 2021-06-24 MED ORDER — MEMANTINE HCL 10 MG PO TABS: 10.0000 mg | ORAL_TABLET | Freq: Two times a day (BID) | ORAL | 3 refills | Status: DC

## 2021-06-24 NOTE — Progress Notes (Signed)
Reason for visit: Memory disturbance, dementia  ANTIONNE ENRIQUE is an 80 y.o. male  History of present illness:  Mr. Westry is an 80 year old right-handed white male with a history of a progressive memory disorder.  There have been some mild changes in memory over time.  He is still operating a motor vehicle, but he only drives short distances during the day light hours.  He has not had any issues with this.  His wife currently does all of the finances, she helps keep up with medications and appointments.  The patient remains active, they walk on a regular basis.  He does have chronic issues with low back pain following prior lumbar spine surgery.  The patient is sleeping well at night, he tolerates the Aricept and Namenda well.  He returns for further evaluation.  Past Medical History:  Diagnosis Date   Coronary artery disease    does not see a cardiologist, reports no cardiac symptoms   Hyperlipidemia    Hypertension    Seasonal allergies     Past Surgical History:  Procedure Laterality Date   BACK SURGERY     fusion,rods and cage   COLONOSCOPY  07/31/2011   Procedure: COLONOSCOPY;  Surgeon: Dalia Heading;  [facility];  Service: Gastroenterology;  Laterality: N/A;   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   Procedure: RIGHT REVERSE SHOULDER ARTHROPLASTY;  Surgeon: Jones Broom, MD;  Location: [facility];  Service: Orthopedics;  Laterality: Right;  Right reverse total shoulder    Family History  Problem Relation Age of Onset   Heart disease Unknown    Asthma Unknown     Social history:  reports that he quit smoking about 30 years ago. His smoking use included cigarettes. He has a 60.00 pack-year smoking history. He has never used smokeless tobacco. He reports that he does not drink alcohol and does not use drugs.   No Known Allergies  Medications:  Prior to Admission medications   Medication Sig Start  Date End Date Taking? Authorizing Provider  albuterol (VENTOLIN HFA) 108 (90 Base) MCG/ACT inhaler Inhale into the lungs every 6 (six) hours as needed for wheezing or shortness of breath.   Yes [provider]  brimonidine (ALPHAGAN) 0.2 % ophthalmic solution 1 drop in the morning, at noon, and at bedtime. 05/07/20  Yes [provider]  donepezil (ARICEPT) 10 MG tablet Take 10 mg by mouth at bedtime.   Yes [provider]  dorzolamide-timolol (COSOPT) 22.3-6.8 MG/ML ophthalmic solution Place 1 drop into the left eye 2 times daily. 05/07/20  Yes [provider]  fluticasone-salmeterol (ADVAIR) 100-50 MCG/ACT AEPB Inhale 1 puff into the lungs 2 (two) times daily. 10/02/19  Yes [provider]  levocetirizine (XYZAL) 5 MG tablet Take 5 mg by mouth every evening.   Yes [provider]  losartan-hydrochlorothiazide (HYZAAR) 100-12.5 MG tablet Take 1 tablet by mouth daily. 05/24/17  Yes [provider]  memantine (NAMENDA) 10 MG tablet Take 1 tablet (10 mg total) by mouth 2 (two) times daily. 06/24/20  Yes Glean Salvo, NP  rosuvastatin (CRESTOR) 10 MG tablet Take 10 mg by mouth daily.     Yes [provider]    ROS:  Out of a complete 14 system review of symptoms, the patient complains only of the following symptoms, and all other reviewed systems are negative.  Low back pain Memory problems  Height 5\' 8"  (1.727 m), weight  141 lb 8 oz (64.2 kg).  Physical Exam  General: The patient is alert and cooperative at the time of the examination.  Skin: No significant peripheral edema is noted.   Neurologic Exam  Mental status: The patient is alert and oriented x 3 at the time of the examination. The Mini-Mental status examination done today shows a total score 19/30.   Cranial nerves: Facial symmetry is present. Speech is normal, no aphasia or dysarthria is noted. Extraocular movements are full. Visual fields are  full.  Motor: The patient has good strength in all 4 extremities.  Sensory examination: Soft touch sensation is symmetric on the face, arms, and legs.  Coordination: The patient has good finger-nose-finger and heel-to-shin bilaterally.  Gait and station: The patient has a slightly wide-based gait, he can walk independently.  Tandem gait was not attempted.  Romberg is negative.  Reflexes: Deep tendon reflexes are symmetric.   Assessment/Plan:  1.  Dementia  The patient remain on Namenda and Aricept, he is progressing slowly with the memory.  He will be given a prescription for the Namenda, he gets his Aricept through his primary care physician.  He will follow-up here in 8 months, in the future he can be followed through Dr. Marjory Lies.  Marlan Palau MD 06/24/2021 11:02 AM  [facility] [address] [place], [place] [address]  Phone [phone] Fax [phone]

## 2021-07-24 DIAGNOSIS — X32XXXD Exposure to sunlight, subsequent encounter: Secondary | ICD-10-CM | POA: Diagnosis not present

## 2021-07-24 DIAGNOSIS — L570 Actinic keratosis: Secondary | ICD-10-CM | POA: Diagnosis not present

## 2021-07-29 DIAGNOSIS — Z961 Presence of intraocular lens: Secondary | ICD-10-CM | POA: Diagnosis not present

## 2021-07-29 DIAGNOSIS — H33022 Retinal detachment with multiple breaks, left eye: Secondary | ICD-10-CM | POA: Diagnosis not present

## 2021-07-29 DIAGNOSIS — H35371 Puckering of macula, right eye: Secondary | ICD-10-CM | POA: Diagnosis not present

## 2021-09-19 DIAGNOSIS — E876 Hypokalemia: Secondary | ICD-10-CM | POA: Diagnosis not present

## 2021-09-19 DIAGNOSIS — Z79899 Other long term (current) drug therapy: Secondary | ICD-10-CM | POA: Diagnosis not present

## 2021-09-19 DIAGNOSIS — I10 Essential (primary) hypertension: Secondary | ICD-10-CM | POA: Diagnosis not present

## 2021-09-19 DIAGNOSIS — E785 Hyperlipidemia, unspecified: Secondary | ICD-10-CM | POA: Diagnosis not present

## 2021-09-19 DIAGNOSIS — G309 Alzheimer's disease, unspecified: Secondary | ICD-10-CM | POA: Diagnosis not present

## 2021-09-26 DIAGNOSIS — E876 Hypokalemia: Secondary | ICD-10-CM | POA: Diagnosis not present

## 2021-09-26 DIAGNOSIS — I10 Essential (primary) hypertension: Secondary | ICD-10-CM | POA: Diagnosis not present

## 2021-09-26 DIAGNOSIS — G300 Alzheimer's disease with early onset: Secondary | ICD-10-CM | POA: Diagnosis not present

## 2022-01-21 DIAGNOSIS — Z0001 Encounter for general adult medical examination with abnormal findings: Secondary | ICD-10-CM | POA: Diagnosis not present

## 2022-01-21 DIAGNOSIS — E039 Hypothyroidism, unspecified: Secondary | ICD-10-CM | POA: Diagnosis not present

## 2022-01-21 DIAGNOSIS — I10 Essential (primary) hypertension: Secondary | ICD-10-CM | POA: Diagnosis not present

## 2022-01-26 DIAGNOSIS — E309 Disorder of puberty, unspecified: Secondary | ICD-10-CM | POA: Diagnosis not present

## 2022-01-26 DIAGNOSIS — E785 Hyperlipidemia, unspecified: Secondary | ICD-10-CM | POA: Diagnosis not present

## 2022-01-26 DIAGNOSIS — I10 Essential (primary) hypertension: Secondary | ICD-10-CM | POA: Diagnosis not present

## 2022-02-05 DIAGNOSIS — L570 Actinic keratosis: Secondary | ICD-10-CM | POA: Diagnosis not present

## 2022-02-05 DIAGNOSIS — X32XXXD Exposure to sunlight, subsequent encounter: Secondary | ICD-10-CM | POA: Diagnosis not present

## 2022-02-05 DIAGNOSIS — C4442 Squamous cell carcinoma of skin of scalp and neck: Secondary | ICD-10-CM | POA: Diagnosis not present

## 2022-02-23 NOTE — Progress Notes (Signed)
? ? ?Patient: Michael [name] ?Date of Birth: Dec 07, 1940 ? ?Reason for Visit: Follow up for dementia ?History from: Patient, wife ?Primary Neurologist: Dr. Julius Bowels. Leta Baptist ? ?ASSESSMENT AND PLAN ?81 y.o. year old male  ? ?1.  Dementia ?-Michael [name] is delightful, overall memory has remained stable, MMSE 21/30 today ?-Continue Aricept and Namenda ?-Have recommended against driving ?-Encouraged exercise, brain stimulating activities ?-Continue follow-up with PCP, return here on an as-needed basis, PCP can follow memory over time ? ?HISTORY OF PRESENT ILLNESS: ?Today 02/24/22 ?Michael [name] here today for follow-up.  On Aricept and Namenda. MMSE 21/30. No major changes. Rarely drives now, he got lost one night after dark coming home from choir practice. His wife had to come out and find him. He got back to the church. He wears fit bit, walks daily for exercise 20 minutes daily with wife. He manages his medications. He keeps a calendar on his desk of appointments. He likes to walk his dog, Sparkey. Has chronic low back pain. He sleeps well, has good appetite. 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 He mowed the lawn yesterday. 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  ? ?HISTORY  ?06/24/2021 Dr. Jannifer Franklin: Michael [name] is an 81 year old right-handed white male with a history of a progressive memory disorder.  There have been some mild changes in memory over time.  He is still operating a motor vehicle, but he only drives short distances during the day light hours.  He has not had any issues with this.  His wife currently does all of the finances, she helps keep up with medications and appointments.  The patient remains active, they walk on a regular basis.  He does have chronic issues with low back pain following prior lumbar spine surgery.  The patient is sleeping well at night, he tolerates the Aricept and Namenda well.  He returns for further evaluation. ? ?REVIEW OF SYSTEMS: Out of a  complete 14 system review of symptoms, the patient complains only of the following symptoms, and all other reviewed systems are negative. ? ?See HPI ? ?ALLERGIES: ?No Known Allergies ? ?HOME MEDICATIONS: ?Outpatient Medications Prior to Visit  ?Medication Sig Dispense Refill  ? albuterol (VENTOLIN HFA) 108 (90 Base) MCG/ACT inhaler Inhale into the lungs every 6 (six) hours as needed for wheezing or shortness of breath.    ? brimonidine (ALPHAGAN) 0.2 % ophthalmic solution 1 drop in the morning, at noon, and at bedtime.    ? donepezil (ARICEPT) 10 MG tablet Take 10 mg by mouth at bedtime.    ? dorzolamide-timolol (COSOPT) 22.3-6.8 MG/ML ophthalmic solution Place 1 drop into the left eye 2 times daily.    ? fluticasone-salmeterol (ADVAIR) 100-50 MCG/ACT AEPB Inhale 1 puff into the lungs 2 (two) times daily.    ? levocetirizine (XYZAL) 5 MG tablet Take 5 mg by mouth every evening.    ? losartan-hydrochlorothiazide (HYZAAR) 100-12.5 MG tablet Take 1 tablet by mouth daily.  0  ? memantine (NAMENDA) 10 MG tablet Take 1 tablet (10 mg total) by mouth 2 (two) times daily. 180 tablet 3  ? rosuvastatin (CRESTOR) 10 MG tablet Take 10 mg by mouth daily.      ? ?No facility-administered medications prior to visit.  ? ? ?PAST MEDICAL HISTORY: ?Past Medical History:  ?Diagnosis Date  ? Coronary artery disease   ? does not see a cardiologist, reports no cardiac symptoms  ? Hyperlipidemia   ? Hypertension   ? Seasonal  allergies   ? ? ?PAST SURGICAL HISTORY: ?Past Surgical History:  ?Procedure Laterality Date  ? BACK SURGERY    ? fusion,rods and cage  ? COLONOSCOPY  07/31/2011  ? Procedure: COLONOSCOPY;  Surgeon: Jamesetta So;  [facility];  Service: Gastroenterology;  Laterality: N/A;  ? 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  ? 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  ? Procedure: RIGHT REVERSE SHOULDER ARTHROPLASTY;  Surgeon: Tania Ade, MD;  Location: [facility];  Service: Orthopedics;  Laterality:  Right;  Right reverse total shoulder  ? ? ?FAMILY HISTORY: ?Family History  ?Problem Relation Age of Onset  ? Heart disease Unknown   ? Asthma Unknown   ? ? ?SOCIAL HISTORY: ?Social History  ? ?Socioeconomic History  ? Marital status: Married  ?  Spouse name: Not on file  ? Number of children: Not on file  ? Years of education: college  ? Highest education level: Not on file  ?Occupational History  ? Occupation: retired  ?  Employer: RETIRED  ?Tobacco Use  ? Smoking status: Former  ?  Packs/day: 1.50  ?  Years: 40.00  ?  Pack years: 60.00  ?  Types: Cigarettes  ?  Quit date: 10/12/1990  ?  Years since quitting: 31.3  ? Smokeless tobacco: Never  ?Substance and Sexual Activity  ? Alcohol use: No  ? Drug use: No  ? Sexual activity: Not on file  ?Other Topics Concern  ? Not on file  ?Social History Narrative  ? Not on file  ? ?Social Determinants of Health  ? ?Financial Resource Strain: Not on file  ?Food Insecurity: Not on file  ?Transportation Needs: Not on file  ?Physical Activity: Not on file  ?Stress: Not on file  ?Social Connections: Not on file  ?Intimate Partner Violence: Not on file  ? ? ?PHYSICAL EXAM ? ?Vitals:  ? 02/24/22 1029  ?BP: 114/66  ?Weight: 145 lb (65.8 kg)  ?Height: 5\' 8"  (1.727 m)  ? ?Body mass index is 22.05 kg/m?. ? ?  02/24/2022  ? 10:32 AM 06/24/2021  ? 11:06 AM 06/24/2020  ? 10:09 AM  ?MMSE - Mini Mental State Exam  ?Orientation to time 2 2 4   ?Orientation to Place 3 3 3   ?Registration 3 3 3   ?Attention/ Calculation 2 2 1   ?Recall 2 0 0  ?Language- name 2 objects 2 2 2   ?Language- repeat 1 1 1   ?Language- follow 3 step command 3 3 3   ?Language- read & follow direction 1 1 1   ?Write a sentence 1 1 1   ?Copy design 1 1 1   ?Total score 21 19 20   ? ? ?Generalized: Well developed, in no acute distress, well-appearing ?Neurological examination  ?Mentation: Alert oriented to time, place, history taking.  Speech is fluent.  Mild difficulty following commands. ?Cranial nerve II-XII: Left pupil is 4 mm,  right is 3 mm, blind in the left eye. Extraocular movements were full, visual field were full on confrontational test. Facial sensation and strength were normal. Head turning and shoulder shrug  were normal and symmetric. ?Motor: The motor testing reveals 5 over 5 strength of all 4 extremities. Good symmetric motor tone is noted throughout.  ?Sensory: Sensory testing is intact to soft touch on all 4 extremities. No evidence of extinction is noted.  ?Coordination: Cerebellar testing reveals good finger-nose-finger and heel-to-shin bilaterally.  ?Gait and station: Gait is slightly wide-based, but steady and independent. ?Reflexes: Deep tendon reflexes are symmetric and normal bilaterally.  ? ?  DIAGNOSTIC DATA (LABS, IMAGING, TESTING) ?- I reviewed patient records, labs, notes, testing and imaging myself where available. ? ?Lab Results  ?Component Value Date  ? WBC 6.6 07/09/2017  ? HGB 12.9 (L) 07/09/2017  ? HCT 38.6 (L) 07/09/2017  ? MCV 84.8 07/09/2017  ? PLT 157 07/09/2017  ? ?   ?Component Value Date/Time  ? NA 134 (L) 07/09/2017 1915  ? K 3.2 (L) 07/09/2017 1915  ? CL 99 (L) 07/09/2017 1915  ? CO2 25 07/09/2017 1915  ? GLUCOSE 103 (H) 07/09/2017 1915  ? BUN 21 (H) 07/09/2017 1915  ? CREATININE 1.53 (H) 07/09/2017 1915  ? CALCIUM 8.2 (L) 07/09/2017 1915  ? PROT 6.6 04/08/2016 1054  ? ALBUMIN 4.5 04/08/2016 1054  ? AST 20 04/08/2016 1054  ? ALT 11 (L) 04/08/2016 1054  ? ALKPHOS 44 04/08/2016 1054  ? BILITOT 1.2 04/08/2016 1054  ? GFRNONAA 42 (L) 07/09/2017 1915  ? GFRAA 48 (L) 07/09/2017 1915  ? ?No results found for: CHOL, HDL, LDLCALC, LDLDIRECT, TRIG, CHOLHDL ?No results found for: HGBA1C ?Lab Results  ?Component Value Date  ? [id number] 12/11/2019  ? ?No results found for: TSH ? ?Butler Denmark, AGNP-C, DNP 02/24/2022, 10:44 AM ?[facility] ?[address], [address] ?[place], [place] [address] ?[phone] ? ?

## 2022-02-24 ENCOUNTER — Ambulatory Visit: Payer: Medicare PPO | Admitting: Neurology

## 2022-02-24 VITALS — BP 114/66 | Ht 68.0 in | Wt 145.0 lb

## 2022-02-24 DIAGNOSIS — F02B0 Dementia in other diseases classified elsewhere, moderate, without behavioral disturbance, psychotic disturbance, mood disturbance, and anxiety: Secondary | ICD-10-CM | POA: Diagnosis not present

## 2022-02-24 DIAGNOSIS — G301 Alzheimer's disease with late onset: Secondary | ICD-10-CM | POA: Diagnosis not present

## 2022-02-24 MED ORDER — MEMANTINE HCL 10 MG PO TABS: 10.0000 mg | ORAL_TABLET | Freq: Two times a day (BID) | ORAL | 3 refills | Status: DC

## 2022-02-24 NOTE — Patient Instructions (Addendum)
Recommend against driving at this point ?Continue Aricept and Namenda ?Continue to see your primary care doctor ?See you back as needed  ?

## 2022-03-05 DIAGNOSIS — Z08 Encounter for follow-up examination after completed treatment for malignant neoplasm: Secondary | ICD-10-CM | POA: Diagnosis not present

## 2022-03-05 DIAGNOSIS — Z85828 Personal history of other malignant neoplasm of skin: Secondary | ICD-10-CM | POA: Diagnosis not present

## 2022-05-27 DIAGNOSIS — F419 Anxiety disorder, unspecified: Secondary | ICD-10-CM | POA: Diagnosis not present

## 2022-05-27 DIAGNOSIS — Z79899 Other long term (current) drug therapy: Secondary | ICD-10-CM | POA: Diagnosis not present

## 2022-05-27 DIAGNOSIS — F1021 Alcohol dependence, in remission: Secondary | ICD-10-CM | POA: Diagnosis not present

## 2022-05-27 DIAGNOSIS — E876 Hypokalemia: Secondary | ICD-10-CM | POA: Diagnosis not present

## 2022-05-27 DIAGNOSIS — G309 Alzheimer's disease, unspecified: Secondary | ICD-10-CM | POA: Diagnosis not present

## 2022-05-27 DIAGNOSIS — E785 Hyperlipidemia, unspecified: Secondary | ICD-10-CM | POA: Diagnosis not present

## 2022-05-27 DIAGNOSIS — J449 Chronic obstructive pulmonary disease, unspecified: Secondary | ICD-10-CM | POA: Diagnosis not present

## 2022-05-27 DIAGNOSIS — R001 Bradycardia, unspecified: Secondary | ICD-10-CM | POA: Diagnosis not present

## 2022-05-27 DIAGNOSIS — I10 Essential (primary) hypertension: Secondary | ICD-10-CM | POA: Diagnosis not present

## 2022-06-01 DIAGNOSIS — Z6821 Body mass index (BMI) 21.0-21.9, adult: Secondary | ICD-10-CM | POA: Diagnosis not present

## 2022-06-01 DIAGNOSIS — G309 Alzheimer's disease, unspecified: Secondary | ICD-10-CM | POA: Diagnosis not present

## 2022-06-01 DIAGNOSIS — I10 Essential (primary) hypertension: Secondary | ICD-10-CM | POA: Diagnosis not present

## 2022-06-01 DIAGNOSIS — R001 Bradycardia, unspecified: Secondary | ICD-10-CM | POA: Diagnosis not present

## 2022-06-01 DIAGNOSIS — E785 Hyperlipidemia, unspecified: Secondary | ICD-10-CM | POA: Diagnosis not present

## 2022-06-01 DIAGNOSIS — J440 Chronic obstructive pulmonary disease with acute lower respiratory infection: Secondary | ICD-10-CM | POA: Diagnosis not present

## 2022-06-01 DIAGNOSIS — F1021 Alcohol dependence, in remission: Secondary | ICD-10-CM | POA: Diagnosis not present

## 2022-07-31 DIAGNOSIS — H35371 Puckering of macula, right eye: Secondary | ICD-10-CM | POA: Diagnosis not present

## 2022-07-31 DIAGNOSIS — Z961 Presence of intraocular lens: Secondary | ICD-10-CM | POA: Diagnosis not present

## 2022-07-31 DIAGNOSIS — H33022 Retinal detachment with multiple breaks, left eye: Secondary | ICD-10-CM | POA: Diagnosis not present

## 2022-08-27 DIAGNOSIS — Z85828 Personal history of other malignant neoplasm of skin: Secondary | ICD-10-CM | POA: Diagnosis not present

## 2022-08-27 DIAGNOSIS — L570 Actinic keratosis: Secondary | ICD-10-CM | POA: Diagnosis not present

## 2022-08-27 DIAGNOSIS — L738 Other specified follicular disorders: Secondary | ICD-10-CM | POA: Diagnosis not present

## 2022-08-27 DIAGNOSIS — X32XXXD Exposure to sunlight, subsequent encounter: Secondary | ICD-10-CM | POA: Diagnosis not present

## 2022-08-27 DIAGNOSIS — Z08 Encounter for follow-up examination after completed treatment for malignant neoplasm: Secondary | ICD-10-CM | POA: Diagnosis not present

## 2022-10-01 DIAGNOSIS — G309 Alzheimer's disease, unspecified: Secondary | ICD-10-CM | POA: Diagnosis not present

## 2022-10-01 DIAGNOSIS — I10 Essential (primary) hypertension: Secondary | ICD-10-CM | POA: Diagnosis not present

## 2023-01-18 ENCOUNTER — Other Ambulatory Visit: Payer: Self-pay

## 2023-01-18 MED ORDER — MEMANTINE HCL 10 MG PO TABS: 10.0000 mg | ORAL_TABLET | Freq: Two times a day (BID) | ORAL | 3 refills | Status: AC

## 2023-02-02 DIAGNOSIS — G309 Alzheimer's disease, unspecified: Secondary | ICD-10-CM | POA: Diagnosis not present

## 2023-02-02 DIAGNOSIS — I10 Essential (primary) hypertension: Secondary | ICD-10-CM | POA: Diagnosis not present

## 2023-02-25 DIAGNOSIS — Z85828 Personal history of other malignant neoplasm of skin: Secondary | ICD-10-CM | POA: Diagnosis not present

## 2023-02-25 DIAGNOSIS — X32XXXD Exposure to sunlight, subsequent encounter: Secondary | ICD-10-CM | POA: Diagnosis not present

## 2023-02-25 DIAGNOSIS — Z08 Encounter for follow-up examination after completed treatment for malignant neoplasm: Secondary | ICD-10-CM | POA: Diagnosis not present

## 2023-02-25 DIAGNOSIS — L570 Actinic keratosis: Secondary | ICD-10-CM | POA: Diagnosis not present

## 2023-05-28 DIAGNOSIS — G309 Alzheimer's disease, unspecified: Secondary | ICD-10-CM | POA: Diagnosis not present

## 2023-05-28 DIAGNOSIS — R001 Bradycardia, unspecified: Secondary | ICD-10-CM | POA: Diagnosis not present

## 2023-05-28 DIAGNOSIS — M5186 Other intervertebral disc disorders, lumbar region: Secondary | ICD-10-CM | POA: Diagnosis not present

## 2023-05-28 DIAGNOSIS — F419 Anxiety disorder, unspecified: Secondary | ICD-10-CM | POA: Diagnosis not present

## 2023-05-28 DIAGNOSIS — R7301 Impaired fasting glucose: Secondary | ICD-10-CM | POA: Diagnosis not present

## 2023-05-28 DIAGNOSIS — J449 Chronic obstructive pulmonary disease, unspecified: Secondary | ICD-10-CM | POA: Diagnosis not present

## 2023-05-28 DIAGNOSIS — Z79899 Other long term (current) drug therapy: Secondary | ICD-10-CM | POA: Diagnosis not present

## 2023-05-28 DIAGNOSIS — E785 Hyperlipidemia, unspecified: Secondary | ICD-10-CM | POA: Diagnosis not present

## 2023-05-28 DIAGNOSIS — I10 Essential (primary) hypertension: Secondary | ICD-10-CM | POA: Diagnosis not present

## 2023-06-04 DIAGNOSIS — F1021 Alcohol dependence, in remission: Secondary | ICD-10-CM | POA: Diagnosis not present

## 2023-06-04 DIAGNOSIS — J440 Chronic obstructive pulmonary disease with acute lower respiratory infection: Secondary | ICD-10-CM | POA: Diagnosis not present

## 2023-06-04 DIAGNOSIS — R001 Bradycardia, unspecified: Secondary | ICD-10-CM | POA: Diagnosis not present

## 2023-06-04 DIAGNOSIS — G300 Alzheimer's disease with early onset: Secondary | ICD-10-CM | POA: Diagnosis not present

## 2023-06-04 DIAGNOSIS — I10 Essential (primary) hypertension: Secondary | ICD-10-CM | POA: Diagnosis not present

## 2023-08-26 DIAGNOSIS — X32XXXD Exposure to sunlight, subsequent encounter: Secondary | ICD-10-CM | POA: Diagnosis not present

## 2023-08-26 DIAGNOSIS — D225 Melanocytic nevi of trunk: Secondary | ICD-10-CM | POA: Diagnosis not present

## 2023-08-26 DIAGNOSIS — Z08 Encounter for follow-up examination after completed treatment for malignant neoplasm: Secondary | ICD-10-CM | POA: Diagnosis not present

## 2023-08-26 DIAGNOSIS — Z85828 Personal history of other malignant neoplasm of skin: Secondary | ICD-10-CM | POA: Diagnosis not present

## 2023-08-26 DIAGNOSIS — L570 Actinic keratosis: Secondary | ICD-10-CM | POA: Diagnosis not present

## 2023-09-17 ENCOUNTER — Encounter (HOSPITAL_COMMUNITY): Payer: Self-pay

## 2023-09-17 ENCOUNTER — Emergency Department (HOSPITAL_COMMUNITY)
Admission: EM | Admit: 2023-09-17 | Discharge: 2023-09-17 | Disposition: A | Discharge status: Home / Self Care | Payer: Medicare PPO | Attending: Emergency Medicine | Admitting: Emergency Medicine

## 2023-09-17 ENCOUNTER — Other Ambulatory Visit: Payer: Self-pay

## 2023-09-17 ENCOUNTER — Emergency Department (HOSPITAL_COMMUNITY): Payer: Medicare PPO

## 2023-09-17 DIAGNOSIS — Y93K1 Activity, walking an animal: Secondary | ICD-10-CM | POA: Insufficient documentation

## 2023-09-17 DIAGNOSIS — W19XXXA Unspecified fall, initial encounter: Secondary | ICD-10-CM

## 2023-09-17 DIAGNOSIS — I2510 Atherosclerotic heart disease of native coronary artery without angina pectoris: Secondary | ICD-10-CM | POA: Insufficient documentation

## 2023-09-17 DIAGNOSIS — R9089 Other abnormal findings on diagnostic imaging of central nervous system: Secondary | ICD-10-CM | POA: Diagnosis not present

## 2023-09-17 DIAGNOSIS — W01198A Fall on same level from slipping, tripping and stumbling with subsequent striking against other object, initial encounter: Secondary | ICD-10-CM | POA: Diagnosis not present

## 2023-09-17 DIAGNOSIS — S0083XA Contusion of other part of head, initial encounter: Secondary | ICD-10-CM

## 2023-09-17 DIAGNOSIS — I10 Essential (primary) hypertension: Secondary | ICD-10-CM | POA: Diagnosis not present

## 2023-09-17 DIAGNOSIS — Z23 Encounter for immunization: Secondary | ICD-10-CM | POA: Diagnosis not present

## 2023-09-17 DIAGNOSIS — G319 Degenerative disease of nervous system, unspecified: Secondary | ICD-10-CM | POA: Diagnosis not present

## 2023-09-17 DIAGNOSIS — S01111A Laceration without foreign body of right eyelid and periocular area, initial encounter: Secondary | ICD-10-CM | POA: Diagnosis not present

## 2023-09-17 DIAGNOSIS — S0181XA Laceration without foreign body of other part of head, initial encounter: Secondary | ICD-10-CM | POA: Diagnosis not present

## 2023-09-17 DIAGNOSIS — S0993XA Unspecified injury of face, initial encounter: Secondary | ICD-10-CM | POA: Diagnosis present

## 2023-09-17 DIAGNOSIS — Z79899 Other long term (current) drug therapy: Secondary | ICD-10-CM | POA: Diagnosis not present

## 2023-09-17 DIAGNOSIS — Z043 Encounter for examination and observation following other accident: Secondary | ICD-10-CM | POA: Diagnosis not present

## 2023-09-17 MED ORDER — TETANUS-DIPHTH-ACELL PERTUSSIS 5-2.5-18.5 LF-MCG/0.5 IM SUSY
0.5000 mL | PREFILLED_SYRINGE | Freq: Once | INTRAMUSCULAR | Status: AC
  Administered 2023-09-17: 0.5 mL via INTRAMUSCULAR
  Filled 2023-09-17: qty 0.5

## 2023-09-17 MED ORDER — LIDOCAINE-EPINEPHRINE (PF) 2 %-1:200000 IJ SOLN
20.0000 mL | Freq: Once | INTRAMUSCULAR | Status: AC
  Administered 2023-09-17: 20 mL
  Filled 2023-09-17: qty 20

## 2023-09-17 NOTE — Discharge Instructions (Addendum)
You have been seen today for your complaint of fall, facial laceration. Your imaging showed slight deformity of the nose without fracture. Your discharge medications include Tylenol.  You may take up to 650 mg every 6 hours for pain. Home care instructions are as follows:  Keep the wound clean and dry for 24 hours.  Clean with warm soapy water once daily after this.  I have used dissolvable sutures so you do not need to have them taken out Follow up with: Your primary care provider in 1 week for reevaluation Please seek immediate medical care if you develop any of the following symptoms: You develop severe swelling around the wound. Your pain suddenly increases and is severe. You develop painful lumps near the wound or on skin anywhere else on your body. You have a red streak going away from your wound. The wound is on your hand or foot, and you cannot properly move a finger or toe. The wound is on your hand or foot, and you notice that your fingers or toes look pale or bluish. At this time there does not appear to be the presence of an emergent medical condition, however there is always the potential for conditions to change. Please read and follow the below instructions.  Do not take your medicine if  develop an itchy rash, swelling in your mouth or lips, or difficulty breathing; call 911 and seek immediate emergency medical attention if this occurs.  You may review your lab tests and imaging results in their entirety on your MyChart account.  Please discuss all results of fully with your primary care provider and other specialist at your follow-up visit.  Note: Portions of this text may have been transcribed using voice recognition software. Every effort was made to ensure accuracy; however, inadvertent computerized transcription errors may still be present.

## 2023-09-17 NOTE — ED Triage Notes (Signed)
Pt wife reports:  Fall Walking the dog Laceration Bilateral eyebrows

## 2023-09-17 NOTE — Progress Notes (Signed)
Pt calm & cooperative with care. Discharge paperwork given and educated pt and family on suture care.

## 2023-09-17 NOTE — ED Provider Notes (Signed)
[place] [facility] DEPARTMENT AT [facility] Provider Note   CSN: [id number] Arrival date & time: 09/17/23  1357     History  Chief Complaint  Patient presents with   Michael [name]    GODSON WAITES is a 82 y.o. male.  With a history of hypertension, hyperlipidemia, CAD presenting to the ED for evaluation of a fall.  He was walking his dog approximately 1 hour prior to arrival when he tripped over a stick on the ground with some wet leaves on top of it.  He fell straight forward.  He was unable to brace himself and struck his face on the ground.  He did not lose consciousness.  No anticoagulation.  Complains of mild pain to the right upper lip.  He has no other complaints.  He denies any headaches, vision changes, numbness, weakness, tingling, chest pain, arm or leg pain.  Denies any sensation of loose teeth.  HPI     Home Medications Prior to Admission medications   Medication Sig Start Date End Date Taking? Authorizing Provider  albuterol (VENTOLIN HFA) 108 (90 Base) MCG/ACT inhaler Inhale into the lungs every 6 (six) hours as needed for wheezing or shortness of breath.    [provider]  brimonidine (ALPHAGAN) 0.2 % ophthalmic solution 1 drop in the morning, at noon, and at bedtime. 05/07/20   [provider]  donepezil (ARICEPT) 10 MG tablet Take 10 mg by mouth at bedtime.    [provider]  dorzolamide-timolol (COSOPT) 22.3-6.8 MG/ML ophthalmic solution Place 1 drop into the left eye 2 times daily. 05/07/20   [provider]  fluticasone-salmeterol (ADVAIR) 100-50 MCG/ACT AEPB Inhale 1 puff into the lungs 2 (two) times daily. 10/02/19   [provider]  levocetirizine (XYZAL) 5 MG tablet Take 5 mg by mouth every evening.    [provider]  losartan-hydrochlorothiazide (HYZAAR) 100-12.5 MG tablet Take 1 tablet by mouth daily. 05/24/17   [provider]  memantine (NAMENDA) 10 MG tablet Take 1 tablet (10 mg total)  by mouth 2 (two) times daily. 01/18/23   Glean Salvo, NP  rosuvastatin (CRESTOR) 10 MG tablet Take 10 mg by mouth daily.      [provider]      Allergies    Patient has no known allergies.    Review of Systems   Review of Systems  Skin:  Positive for wound.  All other systems reviewed and are negative.   Physical Exam Updated Vital Signs BP (!) 150/70 (BP Location: Right Arm)   Pulse (!) 58   Temp 98.9 F (37.2 C) (Oral)   Resp 16   Ht 5\' 8"  (1.727 m)   Wt 63.5 kg   SpO2 100%   BMI 21.29 kg/m  Physical Exam Vitals and nursing note reviewed.  Constitutional:      General: He is not in acute distress.    Appearance: Normal appearance. He is normal weight. He is not ill-appearing.  HENT:     Head: Normocephalic.      Comments: 9 cm laceration to right eyebrow.  Abrasion to bridge of nose.  Bruise to right upper lip    Ears:     Comments: No hemotympanums Eyes:     Comments: No traumatic hyphema  Pulmonary:     Effort: Pulmonary effort is normal. No respiratory distress.  Abdominal:     General: Abdomen is flat.  Musculoskeletal:        General: Normal range of  motion.     Cervical back: Neck supple.  Skin:    General: Skin is warm and dry.  Neurological:     Mental Status: He is alert and oriented to person, place, and time.  Psychiatric:        Mood and Affect: Mood normal.        Behavior: Behavior normal.     ED Results / Procedures / Treatments   Labs (all labs ordered are listed, but only abnormal results are displayed) Labs Reviewed - No data to display  EKG None  Radiology CT Maxillofacial Wo Contrast  Result Date: 09/17/2023 CLINICAL DATA:  Fall while walking dog, laceration to bilateral eyebrows EXAM: CT HEAD WITHOUT CONTRAST CT MAXILLOFACIAL WITHOUT CONTRAST CT CERVICAL SPINE WITHOUT CONTRAST TECHNIQUE: Multidetector CT imaging of the head, cervical spine, and maxillofacial structures were performed using the standard protocol  without intravenous contrast. Multiplanar CT image reconstructions of the cervical spine and maxillofacial structures were also generated. RADIATION DOSE REDUCTION: This exam was performed according to the departmental dose-optimization program which includes automated exposure control, adjustment of the mA and/or kV according to patient size and/or use of iterative reconstruction technique. COMPARISON:  No prior CT head, face, or cervical spine, correlation is made with MRI head 02/14/2013 FINDINGS: CT HEAD FINDINGS Brain: No evidence of acute infarct, hemorrhage, mass, mass effect, or midline shift. No hydrocephalus or extra-axial fluid collection. Age related cerebral volume loss. Mildly more prominent atrophy in the right temporal lobe, with ex vacuo dilatation of the right temporal horn. Vascular: No hyperdense vessel. Atherosclerotic calcifications in the intracranial carotid and vertebral arteries. Skull: Negative for fracture or focal lesion. CT MAXILLOFACIAL FINDINGS Osseous: Possible nasal bone deformity (series 3, images 32-34), although these may be chronic. The nasal septum and anterior nasal spine are intact. No evidence of additional facial bone fracture. No mandibular dislocation. No destructive process. Orbits: No traumatic or inflammatory finding. Sinuses: Mucosal thickening in the right maxillary sinus. Otherwise clear paranasal sinuses. The mastoids are well aerated. Soft tissues: Right periorbital edema. Right supraorbital laceration. CT CERVICAL SPINE FINDINGS Alignment: No traumatic listhesis. Trace retrolisthesis of C3 on C4 and mild retrolisthesis of C4 on C5 appears facet mediated and chronic. Skull base and vertebrae: No acute fracture. No primary bone lesion or focal pathologic process. Soft tissues and spinal canal: No prevertebral fluid or swelling. No visible canal hematoma. Disc levels: Degenerative disc height loss, most prominently at C3-C4 and C4-C5. Moderate to severe spinal canal  stenosis at C4-C5. Upper chest: No focal pulmonary opacity or pleural effusion. Other: None. IMPRESSION: 1. No acute intracranial process. 2. 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 Correlate with point tenderness. 3. Right periorbital edema and right supraorbital laceration. 4. No acute fracture or traumatic listhesis in the cervical spine. Electronically Signed   By: Wiliam Ke M.D.   On: 09/17/2023 17:42   CT Head Wo Contrast  Result Date: 09/17/2023 CLINICAL DATA:  Fall while walking dog, laceration to bilateral eyebrows EXAM: CT HEAD WITHOUT CONTRAST CT MAXILLOFACIAL WITHOUT CONTRAST CT CERVICAL SPINE WITHOUT CONTRAST TECHNIQUE: Multidetector CT imaging of the head, cervical spine, and maxillofacial structures were performed using the standard protocol without intravenous contrast. Multiplanar CT image reconstructions of the cervical spine and maxillofacial structures were also generated. RADIATION DOSE REDUCTION: This exam was performed according to the departmental dose-optimization program which includes automated exposure control, adjustment of the mA and/or kV according to patient size and/or use of iterative reconstruction technique. COMPARISON:  No prior CT head, face, or cervical spine, correlation is made with MRI head 02/14/2013 FINDINGS: CT HEAD FINDINGS Brain: No evidence of acute infarct, hemorrhage, mass, mass effect, or midline shift. No hydrocephalus or extra-axial fluid collection. Age related cerebral volume loss. Mildly more prominent atrophy in the right temporal lobe, with ex vacuo dilatation of the right temporal horn. Vascular: No hyperdense vessel. Atherosclerotic calcifications in the intracranial carotid and vertebral arteries. Skull: Negative for fracture or focal lesion. CT MAXILLOFACIAL FINDINGS Osseous: Possible nasal bone deformity (series 3, images 32-34), although these may be chronic. The nasal septum and anterior nasal spine are intact. No evidence  of additional facial bone fracture. No mandibular dislocation. No destructive process. Orbits: No traumatic or inflammatory finding. Sinuses: Mucosal thickening in the right maxillary sinus. Otherwise clear paranasal sinuses. The mastoids are well aerated. Soft tissues: Right periorbital edema. Right supraorbital laceration. CT CERVICAL SPINE FINDINGS Alignment: No traumatic listhesis. Trace retrolisthesis of C3 on C4 and mild retrolisthesis of C4 on C5 appears facet mediated and chronic. Skull base and vertebrae: No acute fracture. No primary bone lesion or focal pathologic process. Soft tissues and spinal canal: No prevertebral fluid or swelling. No visible canal hematoma. Disc levels: Degenerative disc height loss, most prominently at C3-C4 and C4-C5. 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 Upper chest: No focal pulmonary opacity or pleural effusion. Other: None. IMPRESSION: 1. No acute intracranial process. 2. 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 Correlate with point tenderness. 3. Right periorbital edema and right supraorbital laceration. 4. No acute fracture or traumatic listhesis in the cervical spine. Electronically Signed   By: Wiliam Ke M.D.   On: 09/17/2023 17:42   CT Cervical Spine Wo Contrast  Result Date: 09/17/2023 CLINICAL DATA:  Fall while walking dog, laceration to bilateral eyebrows EXAM: CT HEAD WITHOUT CONTRAST CT MAXILLOFACIAL WITHOUT CONTRAST CT CERVICAL SPINE WITHOUT CONTRAST TECHNIQUE: Multidetector CT imaging of the head, cervical spine, and maxillofacial structures were performed using the standard protocol without intravenous contrast. Multiplanar CT image reconstructions of the cervical spine and maxillofacial structures were also generated. RADIATION DOSE REDUCTION: This exam was performed according to the departmental dose-optimization program which includes automated exposure control, adjustment of the mA and/or kV according to patient size  and/or use of iterative reconstruction technique. COMPARISON:  No prior CT head, face, or cervical spine, correlation is made with MRI head 02/14/2013 FINDINGS: CT HEAD FINDINGS Brain: No evidence of acute infarct, hemorrhage, mass, mass effect, or midline shift. No hydrocephalus or extra-axial fluid collection. Age related cerebral volume loss. Mildly more prominent atrophy in the right temporal lobe, with ex vacuo dilatation of the right temporal horn. Vascular: No hyperdense vessel. Atherosclerotic calcifications in the intracranial carotid and vertebral arteries. Skull: Negative for fracture or focal lesion. CT MAXILLOFACIAL FINDINGS Osseous: Possible nasal bone deformity (series 3, images 32-34), although these may be chronic. The nasal septum and anterior nasal spine are intact. No evidence of additional facial bone fracture. No mandibular dislocation. No destructive process. Orbits: No traumatic or inflammatory finding. Sinuses: Mucosal thickening in the right maxillary sinus. Otherwise clear paranasal sinuses. The mastoids are well aerated. Soft tissues: Right periorbital edema. Right supraorbital laceration. CT CERVICAL SPINE FINDINGS Alignment: No traumatic listhesis. Trace retrolisthesis of C3 on C4 and mild retrolisthesis of C4 on C5 appears facet mediated and chronic. Skull base and vertebrae: No acute fracture. No primary bone lesion or focal pathologic process. Soft tissues and spinal canal: No prevertebral fluid or  swelling. No visible canal hematoma. Disc levels: Degenerative disc height loss, most prominently at C3-C4 and C4-C5. 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 Upper chest: No focal pulmonary opacity or pleural effusion. Other: None. IMPRESSION: 1. No acute intracranial process. 2. 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 Correlate with point tenderness. 3. Right periorbital edema and right supraorbital laceration. 4. No acute fracture or traumatic listhesis  in the cervical spine. Electronically Signed   By: Wiliam Ke M.D.   On: 09/17/2023 17:42    Procedures .Laceration Repair  Date/Time: 09/17/2023 5:25 PM  Performed by: Michelle Piper, PA-C Authorized by: Michelle Piper, PA-C   Consent:    Consent obtained:  Verbal   Consent given by:  Patient   Risks discussed:  Poor cosmetic result, infection and pain   Alternatives discussed:  No treatment Universal protocol:    Procedure explained and questions answered to patient or proxy's satisfaction: yes     Relevant documents present and verified: yes     Test results available: yes     Imaging studies available: yes     Patient identity confirmed:  Verbally with patient and arm band Anesthesia:    Anesthesia method:  Local infiltration   Local anesthetic:  Lidocaine 2% WITH epi Laceration details:    Location:  Face   Face location:  R eyebrow   Length (cm):  9 Exploration:    Hemostasis achieved with:  Epinephrine   Imaging obtained comment:  CT   Wound exploration: entire depth of wound visualized   Treatment:    Area cleansed with:  Shur-Clens and saline   Irrigation solution:  Sterile saline   Irrigation volume:  500 mL Skin repair:    Repair method:  Sutures   Suture size:  5-0   Wound skin closure material used: vicryl rapide.   Suture technique:  Simple interrupted   Number of sutures:  6 Approximation:    Approximation:  Close Repair type:    Repair type:  Simple Post-procedure details:    Dressing:  Adhesive bandage   Procedure completion:  Tolerated well, no immediate complications     Medications Ordered in ED Medications  Tdap (BOOSTRIX) injection 0.5 mL (has no administration in time range)  lidocaine-EPINEPHrine (XYLOCAINE W/EPI) 2 %-1:200000 (PF) injection 20 mL (20 mLs Infiltration Given by Other 09/17/23 1610)    ED Course/ Medical Decision Making/ A&P                                 Medical Decision Making Amount and/or Complexity  of Data Reviewed Radiology: ordered.  Risk Prescription drug management.  This patient presents to the ED for concern of fall, facial laceration, this involves an extensive number of treatment options, and is a complaint that carries with it a high risk of complications and morbidity.  The differential diagnosis includes fracture, contusion, ICH, laceration  My initial workup includes imaging, laceration repair.  Patient declines pain medication  Additional history obtained from: Nursing notes from this visit. Family wife at bedside provides a portion of the history  I ordered imaging studies including CT head, C-spine, maxillofacial I independently visualized and interpreted imaging which showed slight nasal bone deformity without acute osseous abnormality, negative CT head and C-spine I agree with the radiologist interpretation  Afebrile, hemodynamically stable.  82 year old male presenting for evaluation of a mechanical fall just prior to arrival.  No loss of consciousness.  No neurologic complaints.  He has a laceration to the right eyebrow and an abrasion to the nose.  Hemostasis achieved prior to arrival.  Laceration was cleansed and repaired in the emergency department.  Tetanus was updated.  CT imaging negative for acute abnormalities.  Patient was encouraged to follow-up with his primary care provider in 1 week for reevaluation of symptoms.  He was given return precautions.  Stable discharge.  At this time there does not appear to be any evidence of an acute emergency medical condition and the patient appears stable for discharge with appropriate outpatient follow up. Diagnosis was discussed with patient who verbalizes understanding of care plan and is agreeable to discharge. I have discussed return precautions with patient and family at bedside who verbalizes understanding. Patient encouraged to follow-up with their PCP within 1 week. All questions answered.  Note: Portions of this  report may have been transcribed using voice recognition software. Every effort was made to ensure accuracy; however, inadvertent computerized transcription errors may still be present.         Final Clinical Impression(s) / ED Diagnoses Final diagnoses:  Fall, initial encounter  Contusion of face, initial encounter  Facial laceration, initial encounter    Rx / DC Orders ED Discharge Orders     None         Michelle Piper, Cordelia Poche 09/17/23 1751    Rondel Baton, MD 09/18/23 [phone]

## 2023-09-23 ENCOUNTER — Observation Stay (HOSPITAL_COMMUNITY): Payer: Medicare PPO

## 2023-09-23 ENCOUNTER — Encounter (HOSPITAL_COMMUNITY): Payer: Self-pay

## 2023-09-23 ENCOUNTER — Observation Stay (HOSPITAL_COMMUNITY)
Admit: 2023-09-23 | Discharge: 2023-09-23 | Disposition: A | Discharge status: Home / Self Care | Payer: Medicare PPO | Attending: Internal Medicine | Admitting: Internal Medicine

## 2023-09-23 ENCOUNTER — Emergency Department (HOSPITAL_COMMUNITY): Payer: Medicare PPO

## 2023-09-23 ENCOUNTER — Other Ambulatory Visit: Payer: Self-pay

## 2023-09-23 ENCOUNTER — Inpatient Hospital Stay (HOSPITAL_COMMUNITY)
Admission: EM | Admit: 2023-09-23 | Discharge: 2023-09-25 | DRG: 177 | Disposition: A | Discharge status: Home Health | Payer: Medicare PPO | Attending: Internal Medicine | Admitting: Internal Medicine

## 2023-09-23 DIAGNOSIS — Z87891 Personal history of nicotine dependence: Secondary | ICD-10-CM | POA: Diagnosis not present

## 2023-09-23 DIAGNOSIS — E785 Hyperlipidemia, unspecified: Secondary | ICD-10-CM | POA: Diagnosis present

## 2023-09-23 DIAGNOSIS — E876 Hypokalemia: Secondary | ICD-10-CM | POA: Diagnosis present

## 2023-09-23 DIAGNOSIS — M47814 Spondylosis without myelopathy or radiculopathy, thoracic region: Secondary | ICD-10-CM | POA: Diagnosis not present

## 2023-09-23 DIAGNOSIS — M25552 Pain in left hip: Secondary | ICD-10-CM | POA: Diagnosis not present

## 2023-09-23 DIAGNOSIS — K7689 Other specified diseases of liver: Secondary | ICD-10-CM | POA: Diagnosis not present

## 2023-09-23 DIAGNOSIS — M549 Dorsalgia, unspecified: Secondary | ICD-10-CM | POA: Diagnosis present

## 2023-09-23 DIAGNOSIS — R0902 Hypoxemia: Secondary | ICD-10-CM | POA: Diagnosis not present

## 2023-09-23 DIAGNOSIS — R918 Other nonspecific abnormal finding of lung field: Secondary | ICD-10-CM | POA: Diagnosis not present

## 2023-09-23 DIAGNOSIS — N1832 Chronic kidney disease, stage 3b: Secondary | ICD-10-CM | POA: Diagnosis present

## 2023-09-23 DIAGNOSIS — R9089 Other abnormal findings on diagnostic imaging of central nervous system: Secondary | ICD-10-CM | POA: Diagnosis not present

## 2023-09-23 DIAGNOSIS — E860 Dehydration: Secondary | ICD-10-CM | POA: Diagnosis present

## 2023-09-23 DIAGNOSIS — I2510 Atherosclerotic heart disease of native coronary artery without angina pectoris: Secondary | ICD-10-CM | POA: Diagnosis present

## 2023-09-23 DIAGNOSIS — U071 COVID-19: Principal | ICD-10-CM | POA: Insufficient documentation

## 2023-09-23 DIAGNOSIS — G8929 Other chronic pain: Secondary | ICD-10-CM | POA: Diagnosis present

## 2023-09-23 DIAGNOSIS — Z7951 Long term (current) use of inhaled steroids: Secondary | ICD-10-CM

## 2023-09-23 DIAGNOSIS — I6782 Cerebral ischemia: Secondary | ICD-10-CM | POA: Diagnosis not present

## 2023-09-23 DIAGNOSIS — Z981 Arthrodesis status: Secondary | ICD-10-CM | POA: Diagnosis not present

## 2023-09-23 DIAGNOSIS — N179 Acute kidney failure, unspecified: Secondary | ICD-10-CM

## 2023-09-23 DIAGNOSIS — Z79899 Other long term (current) drug therapy: Secondary | ICD-10-CM

## 2023-09-23 DIAGNOSIS — M160 Bilateral primary osteoarthritis of hip: Secondary | ICD-10-CM | POA: Diagnosis not present

## 2023-09-23 DIAGNOSIS — R531 Weakness: Secondary | ICD-10-CM

## 2023-09-23 DIAGNOSIS — M545 Low back pain, unspecified: Secondary | ICD-10-CM | POA: Diagnosis not present

## 2023-09-23 DIAGNOSIS — R4182 Altered mental status, unspecified: Secondary | ICD-10-CM | POA: Diagnosis not present

## 2023-09-23 DIAGNOSIS — G319 Degenerative disease of nervous system, unspecified: Secondary | ICD-10-CM | POA: Diagnosis not present

## 2023-09-23 DIAGNOSIS — Z96611 Presence of right artificial shoulder joint: Secondary | ICD-10-CM | POA: Diagnosis present

## 2023-09-23 DIAGNOSIS — G9341 Metabolic encephalopathy: Secondary | ICD-10-CM | POA: Diagnosis present

## 2023-09-23 DIAGNOSIS — I700 Atherosclerosis of aorta: Secondary | ICD-10-CM | POA: Diagnosis not present

## 2023-09-23 DIAGNOSIS — F0390 Unspecified dementia without behavioral disturbance: Secondary | ICD-10-CM | POA: Insufficient documentation

## 2023-09-23 DIAGNOSIS — I129 Hypertensive chronic kidney disease with stage 1 through stage 4 chronic kidney disease, or unspecified chronic kidney disease: Secondary | ICD-10-CM | POA: Diagnosis present

## 2023-09-23 DIAGNOSIS — R569 Unspecified convulsions: Secondary | ICD-10-CM | POA: Diagnosis not present

## 2023-09-23 DIAGNOSIS — M6282 Rhabdomyolysis: Secondary | ICD-10-CM | POA: Diagnosis present

## 2023-09-23 DIAGNOSIS — K5730 Diverticulosis of large intestine without perforation or abscess without bleeding: Secondary | ICD-10-CM | POA: Diagnosis not present

## 2023-09-23 DIAGNOSIS — K8020 Calculus of gallbladder without cholecystitis without obstruction: Secondary | ICD-10-CM | POA: Diagnosis not present

## 2023-09-23 DIAGNOSIS — G9340 Encephalopathy, unspecified: Secondary | ICD-10-CM | POA: Diagnosis not present

## 2023-09-23 DIAGNOSIS — M4807 Spinal stenosis, lumbosacral region: Secondary | ICD-10-CM | POA: Diagnosis not present

## 2023-09-23 LAB — CBC WITH DIFFERENTIAL/PLATELET
Abs Immature Granulocytes: 0.01 10*3/uL (ref 0.00–0.07)
Basophils Absolute: 0 10*3/uL (ref 0.0–0.1)
Basophils Relative: 0 %
Eosinophils Absolute: 0 10*3/uL (ref 0.0–0.5)
Eosinophils Relative: 0 %
HCT: 46.7 % (ref 39.0–52.0)
Hemoglobin: 15.2 g/dL (ref 13.0–17.0)
Immature Granulocytes: 0 %
Lymphocytes Relative: 11 %
Lymphs Abs: 0.6 10*3/uL — ABNORMAL LOW (ref 0.7–4.0)
MCH: 28.9 pg (ref 26.0–34.0)
MCHC: 32.5 g/dL (ref 30.0–36.0)
MCV: 88.8 fL (ref 80.0–100.0)
Monocytes Absolute: 0.9 10*3/uL (ref 0.1–1.0)
Monocytes Relative: 17 %
Neutro Abs: 3.9 10*3/uL (ref 1.7–7.7)
Neutrophils Relative %: 72 %
Platelets: 193 10*3/uL (ref 150–400)
RBC: 5.26 MIL/uL (ref 4.22–5.81)
RDW: 13.8 % (ref 11.5–15.5)
WBC: 5.3 10*3/uL (ref 4.0–10.5)
nRBC: 0 % (ref 0.0–0.2)

## 2023-09-23 LAB — BASIC METABOLIC PANEL
Anion gap: 15 (ref 5–15)
BUN: 31 mg/dL — ABNORMAL HIGH (ref 8–23)
CO2: 26 mmol/L (ref 22–32)
Calcium: 9.9 mg/dL (ref 8.9–10.3)
Chloride: 99 mmol/L (ref 98–111)
Creatinine, Ser: 1.62 mg/dL — ABNORMAL HIGH (ref 0.61–1.24)
GFR, Estimated: 42 mL/min — ABNORMAL LOW (ref >60)
Glucose, Bld: 107 mg/dL — ABNORMAL HIGH (ref 70–99)
Potassium: 3.8 mmol/L (ref 3.5–5.1)
Sodium: 140 mmol/L (ref 135–145)

## 2023-09-23 LAB — URINALYSIS, ROUTINE W REFLEX MICROSCOPIC
Bacteria, UA: NONE SEEN
Bilirubin Urine: NEGATIVE
Glucose, UA: NEGATIVE mg/dL
Ketones, ur: 5 mg/dL — AB
Leukocytes,Ua: NEGATIVE
Nitrite: NEGATIVE
Protein, ur: 30 mg/dL — AB
Specific Gravity, Urine: 1.025 (ref 1.005–1.030)
pH: 5 (ref 5.0–8.0)

## 2023-09-23 LAB — HEPATIC FUNCTION PANEL
ALT: 12 U/L (ref 0–44)
AST: 33 U/L (ref 15–41)
Albumin: 3.9 g/dL (ref 3.5–5.0)
Alkaline Phosphatase: 64 U/L (ref 38–126)
Bilirubin, Direct: 0.3 mg/dL — ABNORMAL HIGH (ref 0.0–0.2)
Indirect Bilirubin: 1.3 mg/dL — ABNORMAL HIGH (ref 0.3–0.9)
Total Bilirubin: 1.6 mg/dL — ABNORMAL HIGH (ref <1.2)
Total Protein: 7 g/dL (ref 6.5–8.1)

## 2023-09-23 LAB — BLOOD GAS, VENOUS
Acid-Base Excess: 3.8 mmol/L — ABNORMAL HIGH (ref 0.0–2.0)
Bicarbonate: 30.2 mmol/L — ABNORMAL HIGH (ref 20.0–28.0)
Drawn by: 23968
O2 Saturation: 47 %
Patient temperature: 37.1
pCO2, Ven: 51 mm[Hg] (ref 44–60)
pH, Ven: 7.38 (ref 7.25–7.43)
pO2, Ven: <31 mm[Hg] — CL (ref 32–45)

## 2023-09-23 LAB — SARS CORONAVIRUS 2 BY RT PCR: SARS Coronavirus 2 by RT PCR: POSITIVE — AB

## 2023-09-23 LAB — T4, FREE: Free T4: 0.75 ng/dL (ref 0.61–1.12)

## 2023-09-23 LAB — TSH: TSH: 0.544 u[IU]/mL (ref 0.350–4.500)

## 2023-09-23 LAB — VITAMIN B12: Vitamin B-12: 146 pg/mL — ABNORMAL LOW (ref 180–914)

## 2023-09-23 LAB — CK: Total CK: 765 U/L — ABNORMAL HIGH (ref 49–397)

## 2023-09-23 LAB — FOLATE: Folate: 10.6 ng/mL (ref >5.9)

## 2023-09-23 LAB — AMMONIA: Ammonia: 15 umol/L (ref 9–35)

## 2023-09-23 MED ORDER — HEPARIN SODIUM (PORCINE) 5000 UNIT/ML IJ SOLN
5000.0000 [IU] | Freq: Three times a day (TID) | INTRAMUSCULAR | Status: DC
  Administered 2023-09-23 – 2023-09-25 (×5): 5000 [IU] via SUBCUTANEOUS
  Filled 2023-09-23 (×5): qty 1

## 2023-09-23 MED ORDER — LACTATED RINGERS IV SOLN: INTRAVENOUS | Status: DC

## 2023-09-23 MED ORDER — ONDANSETRON HCL 4 MG PO TABS: 4.0000 mg | ORAL_TABLET | Freq: Four times a day (QID) | ORAL | Status: DC | PRN

## 2023-09-23 MED ORDER — ACETAMINOPHEN 650 MG RE SUPP: 650.0000 mg | Freq: Four times a day (QID) | RECTAL | Status: DC | PRN

## 2023-09-23 MED ORDER — ROSUVASTATIN CALCIUM 10 MG PO TABS
10.0000 mg | ORAL_TABLET | Freq: Every day | ORAL | Status: DC
  Administered 2023-09-23 – 2023-09-24 (×2): 10 mg via ORAL
  Filled 2023-09-23 (×2): qty 1

## 2023-09-23 MED ORDER — ONDANSETRON HCL 4 MG/2ML IJ SOLN: 4.0000 mg | Freq: Four times a day (QID) | INTRAMUSCULAR | Status: DC | PRN

## 2023-09-23 MED ORDER — MEMANTINE HCL 10 MG PO TABS
10.0000 mg | ORAL_TABLET | Freq: Two times a day (BID) | ORAL | Status: DC
  Administered 2023-09-23 – 2023-09-25 (×4): 10 mg via ORAL
  Filled 2023-09-23 (×4): qty 1

## 2023-09-23 MED ORDER — ACETAMINOPHEN 325 MG PO TABS
650.0000 mg | ORAL_TABLET | Freq: Four times a day (QID) | ORAL | Status: DC | PRN
  Administered 2023-09-23 – 2023-09-24 (×2): 650 mg via ORAL
  Filled 2023-09-23 (×2): qty 2

## 2023-09-23 MED ORDER — DONEPEZIL HCL 5 MG PO TABS
10.0000 mg | ORAL_TABLET | Freq: Every day | ORAL | Status: DC
  Administered 2023-09-23 – 2023-09-24 (×2): 10 mg via ORAL
  Filled 2023-09-23 (×2): qty 2

## 2023-09-23 MED ORDER — LORAZEPAM 2 MG/ML IJ SOLN: 0.5000 mg | Freq: Once | INTRAMUSCULAR | Status: DC

## 2023-09-23 MED ORDER — MOMETASONE FURO-FORMOTEROL FUM 100-5 MCG/ACT IN AERO
2.0000 | INHALATION_SPRAY | Freq: Two times a day (BID) | RESPIRATORY_TRACT | Status: DC
  Administered 2023-09-23 – 2023-09-25 (×4): 2 via RESPIRATORY_TRACT
  Filled 2023-09-23: qty 8.8

## 2023-09-23 NOTE — Procedures (Signed)
Patient Name: Michael [name]  MRN: [id number]  Epilepsy Attending: Charlsie Quest  Referring Physician/Provider: Catarina Hartshorn, MD  Date: 09/23/2023 Duration: 23.34 mins  Patient history: 82yo M with ams getting eeg to evaluate for seizure  Level of alertness: Awake, asleep  AEDs during EEG study: None  Technical aspects: This EEG study was done with scalp electrodes positioned according to the 10-20 International system of electrode placement. Electrical activity was reviewed with band pass filter of 1-70Hz , sensitivity of 7 uV/mm, display speed of 86mm/sec with a 60Hz  notched filter applied as appropriate. EEG data were recorded continuously and digitally stored.  Video monitoring was available and reviewed as appropriate.  Description: The posterior dominant rhythm consists of 7.5 Hz activity of moderate voltage (25-35 uV) seen predominantly in posterior head regions, symmetric and reactive to eye opening and eye closing. Sleep was characterized by vertex waves, sleep spindles (12 to 14 Hz), maximal frontocentral region. EEG showed intermittent generalized polymorphic sharply contoured 3 to 6 Hz theta-delta slowing. Hyperventilation and photic stimulation were not performed.     ABNORMALITY - Intermittent slow, generalized  IMPRESSION: This study is suggestive of mild diffuse encephalopathy. No seizures or epileptiform discharges were seen throughout the recording.  Monica Codd Annabelle Harman

## 2023-09-23 NOTE — Hospital Course (Signed)
82 year old male with a history of dementia, hypertension, hyperlipidemia, coronary disease, tobacco abuse in remission presenting with altered mental status.  The patient sustained a mechanical fall while walking his dog on 09/17/2023.  The patient slipped on a stick and wet leaves and fell onto his face.  He was brought to the emergency department at that time.  CT of the brain, was negative for any acute findings.  CT maxillofacial and cervical spine showed there was right periorbital edema with the right.  Supraorbital laceration.  There was no acute fracture or traumatic listhesis of the cervical spine.  His supraorbital laceration was repaired, and the patient was discharged home in stable condition. Wife is at bedside supplementing history.  She states that the patient was able to continue on his usual daily activities on 09/18/2023, 09/19/2023, 09/20/2023, and 09/21/2023.  In fact, the patient continued to walk the dog and perform his activities of daily living without assistance as usual. However, wife states that on 09/22/2023 that the patient was not his usual self.  She felt like he was shuffling more than usual with his gait.  In addition, it appeared that his oral intake was decreased.  Around 3 PM, the patient went into his "sun room" where he basically sat for the next 12 to 18 hours.  His wife would good to check up on him, but the patient was not complaining of anything in particular.  Specifically, the patient had denied any chest pain, shortness breath, abdominal pain, headache, fevers, chills, nausea, vomiting.  The patient complains of chronic back pain.  Apparently, the patient just wanted to be left alone.  Wife noted that the patient was more somnolent.  She stated that when she tried to get him up, the patient was not able to get up from the couch. Apparently, the patient laid on the couch throughout the night.  When the wife went to check up on him on the morning of 09/23/2023, he remained  somnolent and was unable to get up.  As result EMS was activated.  Spouse states that patient has not started any new medications, prescription or over-the-counter.  Prior to his mechanical fall on 09/17/2023, the patient had been in his usual state of health.  Again, even after the fall, the patient was able to ambulate and perform his ADLs without difficulty up until 09/22/2023.  Specifically, the wife did not witness any seizure type activity.  In the ED, the patient was afebrile and hemodynamically stable with oxygen saturation 98% room air.  WBC 5.3, hemoglobin 15.2, platelets 193.  Sodium 140, potassium 3.8, bicarbonate 26, serum creatinine 1.62. CT brain was negative for any acute findings.  CT renal stone protocol was negative for any obstructive uropathy.  There is no acute intra-abdominal findings.  CT L-spine showed a lucency surrounding bilateral S1 pedicle screws concerning for loosening of hardware.  There is persistent moderate to severe bilateral neuroforaminal narrowing L5-S1. Case was discussed with neurosurgery, Dr. Conchita Paris, who felt that the patient did not need any further emergent intervention. UA showed 0-5 WBC.  The patient was admitted for further evaluation and treatment of his encephalopathy.

## 2023-09-23 NOTE — ED Provider Notes (Signed)
[facility] AT [facility] Provider Note   CSN: [id number] Arrival date & time: 09/23/23  4332     History  Chief Complaint  Patient presents with   Back Pain    Michael [name] is a 82 y.o. male.  Level 5 caveat secondary to dementia.  He is brought in by EMS from home for back pain.  He is a very poor historian.  He said he has chronic back pain but it has been worse recently.  He cannot tell me how long it has been bothering him or much about it.  He does not know if he takes any medicine for it.  He was seen here 6 days ago for a fall and had a CT head neck and max facial.  He denies a headache or facial or neck pain at this time.  No nausea vomiting abdominal pain.  No urinary symptoms.  No numbness or weakness.  The history is provided by the patient.  Back Pain Location:  Lumbar spine Pain severity:  Mild Onset quality:  Unable to specify Timing:  Unable to specify Progression:  Unable to specify Associated symptoms: no abdominal pain, no chest pain, no numbness and no weakness        Home Medications Prior to Admission medications   Medication Sig Start Date End Date Taking? Authorizing Provider  albuterol (VENTOLIN HFA) 108 (90 Base) MCG/ACT inhaler Inhale into the lungs every 6 (six) hours as needed for wheezing or shortness of breath.    [provider]  brimonidine (ALPHAGAN) 0.2 % ophthalmic solution 1 drop in the morning, at noon, and at bedtime. 05/07/20   [provider]  donepezil (ARICEPT) 10 MG tablet Take 10 mg by mouth at bedtime.    [provider]  dorzolamide-timolol (COSOPT) 22.3-6.8 MG/ML ophthalmic solution Place 1 drop into the left eye 2 times daily. 05/07/20   [provider]  fluticasone-salmeterol (ADVAIR) 100-50 MCG/ACT AEPB Inhale 1 puff into the lungs 2 (two) times daily. 10/02/19   [provider]  levocetirizine (XYZAL) 5 MG tablet Take 5 mg by mouth every evening.     [provider]  losartan-hydrochlorothiazide (HYZAAR) 100-12.5 MG tablet Take 1 tablet by mouth daily. 05/24/17   [provider]  memantine (NAMENDA) 10 MG tablet Take 1 tablet (10 mg total) by mouth 2 (two) times daily. 01/18/23   Glean Salvo, NP  rosuvastatin (CRESTOR) 10 MG tablet Take 10 mg by mouth daily.      [provider]      Allergies    Patient has no known allergies.    Review of Systems   Review of Systems  Unable to perform ROS: Dementia  Cardiovascular:  Negative for chest pain.  Gastrointestinal:  Negative for abdominal pain.  Musculoskeletal:  Positive for back pain.  Neurological:  Negative for weakness and numbness.    Physical Exam Updated Vital Signs BP 128/69 (BP Location: Left Arm)   Pulse 65   Temp 98.1 F (36.7 C) (Oral)   Resp 18   Ht 5\' 8"  (1.727 m)   Wt 63.5 kg   SpO2 98%   BMI 21.29 kg/m  Physical Exam Vitals and nursing note reviewed.  Constitutional:      General: He is not in acute distress.    Appearance: Normal appearance. He is well-developed.  HENT:     Head: Normocephalic.     Comments: Some bruising over his right forehead and right  orbital area. Eyes:     Conjunctiva/sclera: Conjunctivae normal.  Cardiovascular:     Rate and Rhythm: Normal rate and regular rhythm.     Heart sounds: No murmur heard. Pulmonary:     Effort: Pulmonary effort is normal. No respiratory distress.     Breath sounds: Normal breath sounds.  Abdominal:     Palpations: Abdomen is soft.     Tenderness: There is no abdominal tenderness. There is no guarding or rebound.  Musculoskeletal:        General: Tenderness present. No deformity.     Cervical back: Neck supple.     Comments: There is no midline upper or lower back tenderness.  There is some right paralumbar tenderness  Skin:    General: Skin is warm and dry.     Capillary Refill: Capillary refill takes less than 2 seconds.  Neurological:     General: No focal deficit  present.     Mental Status: He is alert.     Sensory: No sensory deficit.     Motor: No weakness.     Deep Tendon Reflexes: Reflexes normal.     ED Results / Procedures / Treatments   Labs (all labs ordered are listed, but only abnormal results are displayed) Labs Reviewed  SARS CORONAVIRUS 2 BY RT PCR - Abnormal; Notable for the following components:      Result Value   SARS Coronavirus 2 by RT PCR POSITIVE (*)    All other components within normal limits  BASIC METABOLIC PANEL - Abnormal; Notable for the following components:   Glucose, Bld 107 (*)    BUN 31 (*)    Creatinine, Ser 1.62 (*)    GFR, Estimated 42 (*)    All other components within normal limits  CBC WITH DIFFERENTIAL/PLATELET - Abnormal; Notable for the following components:   Lymphs Abs 0.6 (*)    All other components within normal limits  URINALYSIS, ROUTINE W REFLEX MICROSCOPIC - Abnormal; Notable for the following components:   APPearance HAZY (*)    Hgb urine dipstick MODERATE (*)    Ketones, ur 5 (*)    Protein, ur 30 (*)    All other components within normal limits  BLOOD GAS, VENOUS - Abnormal; Notable for the following components:   pO2, Ven <31 (*)    Bicarbonate 30.2 (*)    Acid-Base Excess 3.8 (*)    All other components within normal limits  VITAMIN B12 - Abnormal; Notable for the following components:   Vitamin B-12 146 (*)    All other components within normal limits  CK - Abnormal; Notable for the following components:   Total CK 765 (*)    All other components within normal limits  HEPATIC FUNCTION PANEL - Abnormal; Notable for the following components:   Total Bilirubin 1.6 (*)    Bilirubin, Direct 0.3 (*)    Indirect Bilirubin 1.3 (*)    All other components within normal limits  FOLATE  TSH  AMMONIA  T4, FREE  COMPREHENSIVE METABOLIC PANEL  CBC    EKG None  Radiology DG HIP UNILAT WITH PELVIS 2-3 VIEWS LEFT Result Date: 09/23/2023 CLINICAL DATA:  Left hip pain EXAM: DG  HIP (WITH OR WITHOUT PELVIS) 2-3V LEFT COMPARISON:  Same day CT FINDINGS: There is no evidence of hip fracture or dislocation. Mild degenerative changes of both hips. Partially imaged lumbosacral fusion hardware. Mild perihardware lucency associated with the bilateral S1 screws. IMPRESSION: 1. No acute fracture or dislocation of  the left hip. 2. Mild degenerative changes of both hips. 3. Partially imaged lumbosacral fusion hardware with mild perihardware lucency associated with the bilateral S1 screws, compatible with loosening. Electronically Signed   By: Duanne Guess D.O.   On: 09/23/2023 16:04   CT Head Wo Contrast Result Date: 09/23/2023 CLINICAL DATA:  Provided history: Mental status change, unknown cause. Recent fall. EXAM: CT HEAD WITHOUT CONTRAST TECHNIQUE: Contiguous axial images were obtained from the base of the skull through the vertex without intravenous contrast. RADIATION DOSE REDUCTION: This exam was performed according to the departmental dose-optimization program which includes automated exposure control, adjustment of the mA and/or kV according to patient size and/or use of iterative reconstruction technique. COMPARISON:  Head CT 09/17/2023.  Brain MRI 02/14/2013. FINDINGS: Brain: Generalized cerebral atrophy. Commensurate prominence of the ventricles and sulci. Patchy and ill-defined hypoattenuation within the cerebral white matter, nonspecific but compatible with mild chronic small vessel ischemic disease. There is no acute intracranial hemorrhage. No demarcated cortical infarct. No extra-axial fluid collection. No evidence of an intracranial mass. No midline shift. Vascular: No hyperdense vessel.  Atherosclerotic calcifications. Skull: No calvarial fracture or aggressive osseous lesion. Sinuses/Orbits: Chronic hyperdensity of the left globe, which may reflect sequela of prior vitreous hemorrhage and/or post-procedural changes. No acute orbital finding. No significant paranasal sinus  disease. IMPRESSION: 1. No evidence of an acute intracranial abnormality. 2. Parenchymal atrophy and chronic small vessel ischemic disease. Electronically Signed   By: Jackey Loge D.O.   On: 09/23/2023 13:07   CT Renal Stone Study Result Date: 09/23/2023 CLINICAL DATA:  Abdominal/flank pain, stone suspected. EXAM: CT ABDOMEN AND PELVIS WITHOUT CONTRAST TECHNIQUE: Multidetector CT imaging of the abdomen and pelvis was performed following the standard protocol without IV contrast. RADIATION DOSE REDUCTION: This exam was performed according to the departmental dose-optimization program which includes automated exposure control, adjustment of the mA and/or kV according to patient size and/or use of iterative reconstruction technique. COMPARISON:  None Available. FINDINGS: Lower chest: No acute findings.  Coronary artery calcifications. Hepatobiliary: Benign hepatic cysts, measuring up to 1.6 cm in the left hepatic lobe (no imaging follow-up is recommended). No suspicious liver masses. Cholelithiasis. No gallbladder wall thickening or surrounding inflammation to suggest acute cholecystitis. No biliary dilatation. Pancreas: Unremarkable. No pancreatic ductal dilatation or surrounding inflammatory changes. Spleen: Normal in size without focal abnormality. Adrenals/Urinary Tract: Adrenal glands are unremarkable. Kidneys are normal, without renal calculi, focal lesion, or hydronephrosis. Bladder is unremarkable. Stomach/Bowel: Normal stomach and duodenum. No dilated loops of small bowel. Normal appendix is visualized on axial image 57 series 3. Sigmoid diverticulosis. No bowel wall thickening or surrounding inflammation. Vascular/Lymphatic: Aortic atherosclerosis. No enlarged abdominal or pelvic lymph nodes. Reproductive: Prostate is unremarkable. Other: No abdominal wall hernia or abnormality. No abdominopelvic ascites. Musculoskeletal: No acute or suspicious bone findings. Please refer to same-day lumbar spine CT  report for description of lumbar spine findings. IMPRESSION: 1. No acute findings in the abdomen or pelvis. Specifically, no evidence of obstructive uropathy. 2. Cholelithiasis without evidence of acute cholecystitis. 3. Sigmoid diverticulosis without evidence of acute diverticulitis. Aortic Atherosclerosis (ICD10-I70.0). Electronically Signed   By: Orvan Falconer M.D.   On: 09/23/2023 11:12   CT L-SPINE NO CHARGE Result Date: 09/23/2023 CLINICAL DATA:  Back pain. Fall 2 weeks ago with persistent lower backache. EXAM: CT LUMBAR SPINE WITHOUT CONTRAST TECHNIQUE: Multidetector CT imaging of the lumbar spine was performed without intravenous contrast administration. Multiplanar CT image reconstructions were also generated. RADIATION DOSE REDUCTION: This exam  was performed according to the departmental dose-optimization program which includes automated exposure control, adjustment of the mA and/or kV according to patient size and/or use of iterative reconstruction technique. COMPARISON:  MRI lumbar spine 08/11/2012. FINDINGS: Segmentation: Conventional numbering is assumed with 5 non-rib-bearing, lumbar type vertebral bodies. Alignment: No listhesis. Vertebrae: Interval postoperative changes of L1-S1 interbody and posterior spinal fusion. Solid bony fusion across the intervening disc spaces from L1-L5. 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 Normal vertebral body heights. No acute fracture. Degenerative endplate changes at T11-12. Paraspinal and other soft tissues: Please refer to same-day abdominal CT report for retroperitoneal findings. Disc levels: No high-grade spinal canal stenosis. Persistent moderate to severe bilateral neural foraminal narrowing at L5-S1. IMPRESSION: 1. No acute fracture or traumatic listhesis of the lumbar spine. 2. Interval postoperative changes of L1-S1 interbody and posterior spinal fusion. Solid bony fusion across the  intervening disc spaces from L1-L5. 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 3. Persistent moderate to severe bilateral neural foraminal narrowing at L5-S1. Electronically Signed   By: Orvan Falconer M.D.   On: 09/23/2023 11:05    Procedures Procedures    Medications Ordered in ED Medications  rosuvastatin (CRESTOR) tablet 10 mg (10 mg Oral Given 09/23/23 1857)  donepezil (ARICEPT) tablet 10 mg (has no administration in time range)  memantine (NAMENDA) tablet 10 mg (has no administration in time range)  mometasone-formoterol (DULERA) 100-5 MCG/ACT inhaler 2 puff (2 puffs Inhalation Given 09/23/23 2000)  heparin injection 5,000 Units (has no administration in time range)  acetaminophen (TYLENOL) tablet 650 mg (has no administration in time range)    Or  acetaminophen (TYLENOL) suppository 650 mg (has no administration in time range)  ondansetron (ZOFRAN) tablet 4 mg (has no administration in time range)    Or  ondansetron (ZOFRAN) injection 4 mg (has no administration in time range)  lactated ringers infusion ( Intravenous New Bag/Given 09/23/23 1553)    ED Course/ Medical Decision Making/ A&P Clinical Course as of 09/23/23 2017  Thu Sep 23, 2023  1228 Patient's wife is here now.  She said he has had some dementia and forgetfulness.  Had that fall almost a week ago.  She said he was fine up until Tuesday when he started getting more somnolent and confused.  Has been on the couch since yesterday.  Unable to walk [MB]  1326 Updated wife on the negative imaging.  She is in agreement with plan for pursuing admission for altered mental status. [MB]  1417 Discussed with Dr. Arbutus Leas [facility] hospitalist.  He asked if I could reach out to neurosurgery regarding the lucent pedicle screws.  [MB]  1430 Reviewed with neurosurgery Dr. Conchita Paris who said he had no concerns with the loosened pedicle screws causing any acute issues. . [MB]     Clinical Course User Index [MB] Terrilee Files, MD                                 Medical Decision Making Amount and/or Complexity of Data Reviewed Labs: ordered. Radiology: ordered.  Risk Decision regarding hospitalization.   This patient complains of falls weakness lethargy; this involves an extensive number of treatment Options and is a complaint that carries with it a high risk of complications and morbidity. The differential includes head bleed, fracture, contusion, metabolic derangement, sepsis, Sirs, dehydration, infection  I  ordered, reviewed and interpreted labs, which included CBC normal chemistries with chronic CKD, urinalysis without signs of infection I ordered medication IV fluids and reviewed PMP when indicated. I ordered imaging studies which included CT head, CT renal CT lumbar spine and I independently    visualized and interpreted imaging which showed no acute findings.  Does have some hardware loosening Additional history obtained from patient's wife and son Previous records obtained and reviewed in epic including recent ED notes I consulted [facility] hospitalist Dr. Arbutus Leas and neurosurgery Dr. Conchita Paris and discussed lab and imaging findings and discussed disposition.  Cardiac monitoring reviewed, sinus rhythm Social determinants considered, no significant barriers Critical Interventions: None  After the interventions stated above, I reevaluated the patient and found patient to be neuro intact but still very lethargic Admission and further testing considered, he would benefit from mission for further evaluation and likely will need PT OT.  Family in agreement with plan for admission.         Final Clinical Impression(s) / ED Diagnoses Final diagnoses:  Acute metabolic encephalopathy  General weakness    Rx / DC Orders ED Discharge Orders     None         Terrilee Files, MD 09/23/23 2020

## 2023-09-23 NOTE — Progress Notes (Signed)
EEG complete - results pending 

## 2023-09-23 NOTE — ED Triage Notes (Signed)
Pt seen about 2 weeks ago for a fall, everything checked out and sent home. Now pt back with lower back "ache" level 2 pain at the moment. States family wanted him to come get checked out. States he does have rods in back from previous injuries. Not diabetic. No blood thinners.

## 2023-09-23 NOTE — Progress Notes (Signed)
   09/23/23 1520  TOC Brief Assessment  Insurance and Status Reviewed  Patient has primary care physician Yes  Home environment has been reviewed Home with spouse  Prior level of function: Indepedent  Prior/Current Home Services No current home services  Social Drivers of Health Review SDOH reviewed no interventions necessary  Readmission risk has been reviewed Yes  Transition of care needs no transition of care needs at this time   Transition of Care Department [facility]) has reviewed patient and no TOC needs have been identified at this time. We will continue to monitor patient advancement through interdisciplinary progression rounds. If new patient transition needs arise, please place a TOC consult.  Based on doctor H&P, patient may get consulted for a PT evaluation . TOC will continue to follow and assist if any recommendations are needed.

## 2023-09-23 NOTE — ED Notes (Addendum)
Wife and son are here to visit patient at this time. Wife came in pt room asking about his legs and weakness, but not his lower back. Also, addiment that pt is confused and will not be able to give urine but requests that he have a urinalysis completed. Pt has not been confused for any induration of time that he has been here today, so far.

## 2023-09-23 NOTE — ED Notes (Signed)
Pt states he is unable to provide urine at this time

## 2023-09-23 NOTE — Progress Notes (Signed)
MRI Screening done over phone with wife, Jasson Schwein @ 2146. Patient is very claustrophobic and patient has dementia.  Judithann Sauger, LPN for patient is going to message doctor for medication order.   Patient also needs to be done with 2 MRI techs as one will have to stay gowned up because patient can become very confused and try to get off the bed.  This is for the safety of the patient and the staff.

## 2023-09-23 NOTE — H&P (Signed)
History and Physical    Patient: Michael [name]:[id number] DOB: [birth year] DOA: 09/23/2023 DOS: the patient was seen and examined on 09/23/2023 PCP: Carylon Perches, MD  Patient coming from: Home  Chief Complaint:  Chief Complaint  Patient presents with   Back Pain   HPI: Michael [name] is a 82 year old male with a history of dementia, hypertension, hyperlipidemia, coronary disease, tobacco abuse in remission presenting with altered mental status.  The patient sustained a mechanical fall while walking his dog on 09/17/2023.  The patient slipped on a stick and wet leaves and fell onto his face.  He was brought to the emergency department at that time.  CT of the brain, was negative for any acute findings.  CT maxillofacial and cervical spine showed there was right periorbital edema with the right.  Supraorbital laceration.  There was no acute fracture or traumatic listhesis of the cervical spine.  His supraorbital laceration was repaired, and the patient was discharged home in stable condition. Wife is at bedside supplementing history.  She states that the patient was able to continue on his usual daily activities on 09/18/2023, 09/19/2023, 09/20/2023, and 09/21/2023.  In fact, the patient continued to walk the dog and perform his activities of daily living without assistance as usual. However, wife states that on 09/22/2023 that the patient was not his usual self.  She felt like he was shuffling more than usual with his gait.  In addition, it appeared that his oral intake was decreased.  Around 3 PM, the patient went into his "sun room" where he basically sat for the next 12 to 18 hours.  His wife would good to check up on him, but the patient was not complaining of anything in particular.  Specifically, the patient had denied any chest pain, shortness breath, abdominal pain, headache, fevers, chills, nausea, vomiting.  The patient complains of chronic back pain.  Apparently, the patient just wanted to  be left alone.  Wife noted that the patient was more somnolent.  She stated that when she tried to get him up, the patient was not able to get up from the couch. Apparently, the patient laid on the couch throughout the night.  When the wife went to check up on him on the morning of 09/23/2023, he remained somnolent and was unable to get up.  As result EMS was activated.  Spouse states that patient has not started any new medications, prescription or over-the-counter.  Prior to his mechanical fall on 09/17/2023, the patient had been in his usual state of health.  Again, even after the fall, the patient was able to ambulate and perform his ADLs without difficulty up until 09/22/2023.  Specifically, the wife did not witness any seizure type activity.  In the ED, the patient was afebrile and hemodynamically stable with oxygen saturation 98% room air.  WBC 5.3, hemoglobin 15.2, platelets 193.  Sodium 140, potassium 3.8, bicarbonate 26, serum creatinine 1.62. CT brain was negative for any acute findings.  CT renal stone protocol was negative for any obstructive uropathy.  There is no acute intra-abdominal findings.  CT L-spine showed a lucency surrounding bilateral S1 pedicle screws concerning for loosening of hardware.  There is persistent moderate to severe bilateral neuroforaminal narrowing L5-S1. Case was discussed with neurosurgery, Dr. Conchita Paris, who felt that the patient did not need any further emergent intervention. UA showed 0-5 WBC.  The patient was admitted for further evaluation and treatment of his encephalopathy.  Review of Systems: As mentioned  in the history of present illness. All other systems reviewed and are negative. Past Medical History:  Diagnosis Date   Coronary artery disease    does not see a cardiologist, reports no cardiac symptoms   Hyperlipidemia    Hypertension    Seasonal allergies    Past Surgical History:  Procedure Laterality Date   BACK SURGERY     fusion,rods and  cage   COLONOSCOPY  07/31/2011   Procedure: COLONOSCOPY;  Surgeon: Dalia Heading;  Location: [facility];  Service: Gastroenterology;  Laterality: N/A;   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   Procedure: RIGHT REVERSE SHOULDER ARTHROPLASTY;  Surgeon: Jones Broom, MD;  Location: [facility] OR;  Service: Orthopedics;  Laterality: Right;  Right reverse total shoulder   Social History:  reports that he quit smoking about 32 years ago. His smoking use included cigarettes. He started smoking about 72 years ago. He has a 60 pack-year smoking history. He has never used smokeless tobacco. He reports that he does not drink alcohol and does not use drugs.  No Known Allergies  Family History  Problem Relation Age of Onset   Heart disease Unknown    Asthma Unknown     Prior to Admission medications   Medication Sig Start Date End Date Taking? Authorizing Provider  albuterol (VENTOLIN HFA) 108 (90 Base) MCG/ACT inhaler Inhale into the lungs every 6 (six) hours as needed for wheezing or shortness of breath.    [provider]  brimonidine (ALPHAGAN) 0.2 % ophthalmic solution 1 drop in the morning, at noon, and at bedtime. 05/07/20   [provider]  donepezil (ARICEPT) 10 MG tablet Take 10 mg by mouth at bedtime.    [provider]  dorzolamide-timolol (COSOPT) 22.3-6.8 MG/ML ophthalmic solution Place 1 drop into the left eye 2 times daily. 05/07/20   [provider]  fluticasone-salmeterol (ADVAIR) 100-50 MCG/ACT AEPB Inhale 1 puff into the lungs 2 (two) times daily. 10/02/19   [provider]  levocetirizine (XYZAL) 5 MG tablet Take 5 mg by mouth every evening.    [provider]  losartan-hydrochlorothiazide (HYZAAR) 100-12.5 MG tablet Take 1 tablet by mouth daily. 05/24/17   [provider]  memantine (NAMENDA) 10 MG tablet Take 1 tablet (10 mg total) by mouth 2 (two) times daily. 01/18/23    Glean Salvo, NP  rosuvastatin (CRESTOR) 10 MG tablet Take 10 mg by mouth daily.      [provider]    Physical Exam: Vitals:   09/23/23 0931 09/23/23 0935  BP: 128/69   Pulse: 65   Resp: 18   Temp: 98.1 F (36.7 C)   TempSrc: Oral   SpO2: 98%   Weight:  63.5 kg  Height:  5\' 8"  (1.727 m)   GENERAL:  A&O x 2, NAD, well developed, cooperative, follows commands HEENT: [place]/AT, No thrush, No icterus, No oral ulcers Neck:  No neck mass, No meningismus, soft, supple CV: RRR, no S3, no S4, no rub, no JVD Lungs:  fine bibasilar crackles.  No wheeze.  Diminished BS Abd: soft/NT +BS, nondistended Ext: No edema, no lymphangitis, no cyanosis, no rashes Neuro:  CN II-XII intact, strength 4+/5 in RUE, RLE, strength 4+/5 LUE, 4-/5LLE; sensation intact bilateral; no dysmetria; babinski equivocal  Data Reviewed: Data reviewed above in history  Assessment and Plan: Acute metabolic encephalopathy -Suspect dehydration versus postconcussive syndrome -UA negative for pyuria -CT negative for acute findings -MRI brain -TSH -  B12 -Folic acid -CPK -EEG -VBG -start IVF  Generalized weakness -Work up as above -PT eval  CKD 3b -baseline creatinine 1.5-1.6  Essential hypertension -Holding Hyzaar -Monitor BP  Dementia without behavioral disturbance -Continue Aricept and Namenda -MMSE 21/30 noted during neurology visit on 02/24/2022  History of tobacco use, in remission -Patient has 20+ pack years, quit 30 years ago    Advance Care Planning: FULL CODE  Consults: none  Family Communication: spouse and son 09/23/23  Severity of Illness: The appropriate patient status for this patient is OBSERVATION. Observation status is judged to be reasonable and necessary in order to provide the required intensity of service to ensure the patient's safety. The patient's presenting symptoms, physical exam findings, and initial radiographic and laboratory data in the context of their  medical condition is felt to place them at decreased risk for further clinical deterioration. Furthermore, it is anticipated that the patient will be medically stable for discharge from the hospital within 2 midnights of admission.   Author: Catarina Hartshorn, MD 09/23/2023 2:26 PM  For on call review [email or web address].

## 2023-09-24 ENCOUNTER — Inpatient Hospital Stay (HOSPITAL_COMMUNITY): Payer: Medicare PPO

## 2023-09-24 DIAGNOSIS — Z79899 Other long term (current) drug therapy: Secondary | ICD-10-CM | POA: Diagnosis not present

## 2023-09-24 DIAGNOSIS — Z87891 Personal history of nicotine dependence: Secondary | ICD-10-CM | POA: Diagnosis not present

## 2023-09-24 DIAGNOSIS — M6282 Rhabdomyolysis: Secondary | ICD-10-CM | POA: Diagnosis present

## 2023-09-24 DIAGNOSIS — E860 Dehydration: Secondary | ICD-10-CM | POA: Diagnosis present

## 2023-09-24 DIAGNOSIS — N1832 Chronic kidney disease, stage 3b: Secondary | ICD-10-CM | POA: Diagnosis present

## 2023-09-24 DIAGNOSIS — G9341 Metabolic encephalopathy: Secondary | ICD-10-CM | POA: Diagnosis present

## 2023-09-24 DIAGNOSIS — I129 Hypertensive chronic kidney disease with stage 1 through stage 4 chronic kidney disease, or unspecified chronic kidney disease: Secondary | ICD-10-CM | POA: Diagnosis present

## 2023-09-24 DIAGNOSIS — E785 Hyperlipidemia, unspecified: Secondary | ICD-10-CM | POA: Diagnosis present

## 2023-09-24 DIAGNOSIS — F0390 Unspecified dementia without behavioral disturbance: Secondary | ICD-10-CM | POA: Diagnosis present

## 2023-09-24 DIAGNOSIS — M549 Dorsalgia, unspecified: Secondary | ICD-10-CM | POA: Diagnosis present

## 2023-09-24 DIAGNOSIS — Z7951 Long term (current) use of inhaled steroids: Secondary | ICD-10-CM | POA: Diagnosis not present

## 2023-09-24 DIAGNOSIS — Z96611 Presence of right artificial shoulder joint: Secondary | ICD-10-CM | POA: Diagnosis present

## 2023-09-24 DIAGNOSIS — I2510 Atherosclerotic heart disease of native coronary artery without angina pectoris: Secondary | ICD-10-CM | POA: Diagnosis present

## 2023-09-24 DIAGNOSIS — E876 Hypokalemia: Secondary | ICD-10-CM | POA: Diagnosis present

## 2023-09-24 DIAGNOSIS — G8929 Other chronic pain: Secondary | ICD-10-CM | POA: Diagnosis present

## 2023-09-24 DIAGNOSIS — U071 COVID-19: Secondary | ICD-10-CM | POA: Diagnosis present

## 2023-09-24 DIAGNOSIS — N179 Acute kidney failure, unspecified: Secondary | ICD-10-CM | POA: Diagnosis present

## 2023-09-24 LAB — CBC
HCT: 39.2 % (ref 39.0–52.0)
Hemoglobin: 12.4 g/dL — ABNORMAL LOW (ref 13.0–17.0)
MCH: 28.2 pg (ref 26.0–34.0)
MCHC: 31.6 g/dL (ref 30.0–36.0)
MCV: 89.3 fL (ref 80.0–100.0)
Platelets: 153 10*3/uL (ref 150–400)
RBC: 4.39 MIL/uL (ref 4.22–5.81)
RDW: 13.7 % (ref 11.5–15.5)
WBC: 4.7 10*3/uL (ref 4.0–10.5)
nRBC: 0 % (ref 0.0–0.2)

## 2023-09-24 LAB — COMPREHENSIVE METABOLIC PANEL
ALT: 10 U/L (ref 0–44)
AST: 31 U/L (ref 15–41)
Albumin: 3 g/dL — ABNORMAL LOW (ref 3.5–5.0)
Alkaline Phosphatase: 45 U/L (ref 38–126)
Anion gap: 11 (ref 5–15)
BUN: 36 mg/dL — ABNORMAL HIGH (ref 8–23)
CO2: 26 mmol/L (ref 22–32)
Calcium: 8.5 mg/dL — ABNORMAL LOW (ref 8.9–10.3)
Chloride: 99 mmol/L (ref 98–111)
Creatinine, Ser: 1.3 mg/dL — ABNORMAL HIGH (ref 0.61–1.24)
GFR, Estimated: 54 mL/min — ABNORMAL LOW (ref >60)
Glucose, Bld: 92 mg/dL (ref 70–99)
Potassium: 3.1 mmol/L — ABNORMAL LOW (ref 3.5–5.1)
Sodium: 136 mmol/L (ref 135–145)
Total Bilirubin: 1.1 mg/dL (ref <1.2)
Total Protein: 5.3 g/dL — ABNORMAL LOW (ref 6.5–8.1)

## 2023-09-24 MED ORDER — POTASSIUM CHLORIDE CRYS ER 20 MEQ PO TBCR
40.0000 meq | EXTENDED_RELEASE_TABLET | Freq: Once | ORAL | Status: AC
  Administered 2023-09-24: 40 meq via ORAL
  Filled 2023-09-24: qty 2

## 2023-09-24 MED ORDER — POTASSIUM CHLORIDE IN NACL 20-0.9 MEQ/L-% IV SOLN: INTRAVENOUS | Status: DC

## 2023-09-24 MED ORDER — DM-GUAIFENESIN ER 30-600 MG PO TB12
1.0000 | ORAL_TABLET | Freq: Two times a day (BID) | ORAL | Status: DC
Start: 2023-09-24 — End: 2023-09-25
  Administered 2023-09-24 – 2023-09-25 (×2): 1 via ORAL
  Filled 2023-09-24 (×2): qty 1

## 2023-09-24 MED ORDER — CYANOCOBALAMIN 1000 MCG/ML IJ SOLN
1000.0000 ug | Freq: Once | INTRAMUSCULAR | Status: AC
  Administered 2023-09-24: 1000 ug via INTRAMUSCULAR
  Filled 2023-09-24: qty 1

## 2023-09-24 NOTE — Progress Notes (Signed)
Pt care assumed from [place], LPN. Pt resting quietly in bed, denies c/o. Alert and oriented x3. Old bruising and abrasions noted to face.   Lungs with coarse expiratory wheezes bilaterally. Congested but nonproductive cough. No c/o SOB or respiratory difficulty. HR RRR, tele reads NSR. Skin warm and dry, color appropriate and cap refill < 2 sec. IVF infusing per order without s/s infiltration. Bed alarm on for safety, call bell within reach. Advised pt and wife to call for needs, both state understanding.

## 2023-09-24 NOTE — Plan of Care (Signed)
  Problem: Acute Rehab PT Goals(only PT should resolve) Goal: Pt Will Go Supine/Side To Sit Outcome: Progressing Flowsheets (Taken 09/24/2023 1439) Pt will go Supine/Side to Sit:  with modified independence  with supervision Goal: Patient Will Transfer Sit To/From Stand Outcome: Progressing Flowsheets (Taken 09/24/2023 1439) Patient will transfer sit to/from stand:  with modified independence  with supervision Goal: Pt Will Transfer Bed To Chair/Chair To Bed Outcome: Progressing Flowsheets (Taken 09/24/2023 1439) Pt will Transfer Bed to Chair/Chair to Bed:  with modified independence  with supervision Goal: Pt Will Ambulate Outcome: Progressing Flowsheets (Taken 09/24/2023 1439) Pt will Ambulate:  75 feet  with modified independence  with supervision  with least restrictive assistive device   2:39 PM, 09/24/23 Ocie Bob, MPT Physical Therapist with [facility] 336 [phone] office [phone] mobile phone

## 2023-09-24 NOTE — TOC Initial Note (Signed)
Transition of Care [facility]) - Initial/Assessment Note    Patient Details  Name: Michael [name] MRN: [id number] Date of Birth: 19-Nov-1940  Transition of Care [facility] CM/SW Contact:    Elliot Gault, LCSW Phone Number: 09/24/2023, 12:30 PM  Clinical Narrative:                  Pt admitted from home. PT recommending HHPT at dc. MD anticipating dc tomorrow.  Spoke with pt's wife to review dc planning. She states pt was not using any DME at home prior to admission though they do have a cane and a walker if needed.   Pt's wife is agreeable to HHPT at dc. CMS provider options reviewed. Referred to [facility] as requested. [facility] at [facility] accepted and they will follow pt at home after dc. Information added to AVS.  TOC will follow.  Expected Discharge Plan: Home w Home Health Services Barriers to Discharge: Continued Medical Work up   Patient Goals and CMS Choice Patient states their goals for this hospitalization and ongoing recovery are:: go home CMS Medicare.gov Compare Post Acute Care list provided to:: Patient Represenative (must comment) Choice offered to / list presented to : Spouse      Expected Discharge Plan and Services In-house Referral: Clinical Social Work   Post Acute Care Choice: Home Health Living arrangements for the past 2 months: Single Family Home                           HH Arranged: PT HH Agency: [facility] Date [facility] Agency Contacted: 09/24/23   Representative spoke with at [facility] Agency: Kandee Keen  Prior Living Arrangements/Services Living arrangements for the past 2 months: Single Family Home Lives with:: Spouse Patient language and need for interpreter reviewed:: Yes Do you feel safe going back to the place where you live?: Yes      Need for Family Participation in Patient Care: Yes (Comment) Care giver support system in place?: Yes (comment)   Criminal Activity/Legal Involvement Pertinent to Current Situation/Hospitalization: No - Comment as  needed  Activities of Daily Living   ADL Screening (condition at time of admission) Independently performs ADLs?: Yes (appropriate for developmental age) Is the patient deaf or have difficulty hearing?: No Does the patient have difficulty seeing, even when wearing glasses/contacts?: No Does the patient have difficulty concentrating, remembering, or making decisions?: Yes  Permission Sought/Granted Permission sought to share information with : Facility Industrial/product designer granted to share information with : Yes, Verbal Permission Granted     Permission granted to share info w AGENCY: HH        Emotional Assessment       Orientation: : Oriented to Self, Oriented to Place Alcohol / Substance Use: Not Applicable Psych Involvement: No (comment)  Admission diagnosis:  Acute metabolic encephalopathy [G93.41] Patient Active Problem List   Diagnosis Date Noted   COVID-19 virus infection 09/24/2023   Acute metabolic encephalopathy 09/23/2023   Chronic kidney disease, stage 3b (HCC) 09/23/2023   Major neurocognitive disorder (HCC) 09/23/2023   Dementia (HCC) 06/24/2020   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   Lumbosacral spondylosis without myelopathy 07/11/2013   S/P lumbar fusion 07/11/2013   Back pain, chronic 09/19/2012   Leg weakness, bilateral 09/19/2012   Difficulty walking 09/19/2012   Tendonitis of shoulder, right 10/26/2011   Vertigo 10/26/2011   Pain in joint, shoulder region 10/26/2011   Shoulder weakness 10/26/2011   PCP:  Carylon Perches, MD Pharmacy:   [facility] [phone] - [place], [place] - [address] AT [facility] [address] [place] [address] Phone: [phone] Fax: [phone]     Social Drivers of Health (SDOH) Social History: SDOH Screenings   Food Insecurity: No Food Insecurity (09/24/2023)  Housing: Low Risk  (09/24/2023)  Transportation Needs: No Transportation Needs  (09/24/2023)  Utilities: Not At Risk (09/24/2023)  Tobacco Use: Medium Risk (09/23/2023)   SDOH Interventions:     Readmission Risk Interventions     No data to display

## 2023-09-24 NOTE — Plan of Care (Signed)
  Problem: Clinical Measurements: Goal: Respiratory complications will improve Outcome: Progressing   Problem: Activity: Goal: Risk for activity intolerance will decrease Outcome: Progressing   Problem: Clinical Measurements: Goal: Ability to maintain clinical measurements within normal limits will improve Outcome: Progressing   Problem: Elimination: Goal: Will not experience complications related to bowel motility Outcome: Progressing   Problem: Safety: Goal: Ability to remain free from injury will improve Outcome: Progressing   Problem: Respiratory: Goal: Will maintain a patent airway Outcome: Progressing

## 2023-09-24 NOTE — Progress Notes (Signed)
MRI called last night to do MRI, but patient requires medication for procedure to help with claustrophobia. Wife stated during screening with RT he would need meds to keep him calm. MD notified and 1 time dose of Ativan was ordered. Contacted RT and they want to wait to do MRI this morning when more staff are available to assist with safety considering this patient suffers from dementia and confused.

## 2023-09-24 NOTE — Evaluation (Signed)
Physical Therapy Evaluation Patient Details Name: Michael [name] MRN: [id number] DOB: 09-09-1941 Today's Date: 09/24/2023  History of Present Illness  Michael [name] is a 82 year old male with a history of dementia, hypertension, hyperlipidemia, coronary disease, tobacco abuse in remission presenting with altered mental status.  The patient sustained a mechanical fall while walking his dog on 09/17/2023.  The patient slipped on a stick and wet leaves and fell onto his face.  He was brought to the emergency department at that time.  CT of the brain, was negative for any acute findings.  CT maxillofacial and cervical spine showed there was right periorbital edema with the right.  Supraorbital laceration.  There was no acute fracture or traumatic listhesis of the cervical spine.  His supraorbital laceration was repaired, and the patient was discharged home in stable condition.  Wife is at bedside supplementing history.  She states that the patient was able to continue on his usual daily activities on 09/18/2023, 09/19/2023, 09/20/2023, and 09/21/2023.  In fact, the patient continued to walk the dog and perform his activities of daily living without assistance as usual.  However, wife states that on 09/22/2023 that the patient was not his usual self.  She felt like he was shuffling more than usual with his gait.  In addition, it appeared that his oral intake was decreased.  Around 3 PM, the patient went into his "sun room" where he basically sat for the next 12 to 18 hours.  His wife would good to check up on him, but the patient was not complaining of anything in particular.  Specifically, the patient had denied any chest pain, shortness breath, abdominal pain, headache, fevers, chills, nausea, vomiting.  The patient complains of chronic back pain.  Apparently, the patient just wanted to be left alone.  Wife noted that the patient was more somnolent.  She stated that when she tried to get him up, the patient was  not able to get up from the couch.  Apparently, the patient laid on the couch throughout the night.  When the wife went to check up on him on the morning of 09/23/2023, he remained somnolent and was unable to get up.  As result EMS was activated.     Spouse states that patient has not started any new medications, prescription or over-the-counter.  Prior to his mechanical fall on 09/17/2023, the patient had been in his usual state of health.  Again, even after the fall, the patient was able to ambulate and perform his ADLs without difficulty up until 09/22/2023.  Specifically, the wife did not witness any seizure type activity.   Clinical Impression  Patient demonstrates labored movement for sitting up at bedside, requires increased time for completing sit to stands, but once on feet able to maintain standing balance without AD and ambulate room with slow labored shuffle steps (normally shuffles feet when walking which is baseline per patient's spouse) without loss of balance.  Patient tolerated sitting up in chair after therapy with spouse present - nurse notified.  Patient will benefit from continued skilled physical therapy in hospital and recommended venue below to increase strength, balance, endurance for safe ADLs and gait.           If plan is discharge home, recommend the following: A little help with walking and/or transfers;Help with stairs or ramp for entrance;A little help with bathing/dressing/bathroom;Assistance with cooking/housework   Can travel by private vehicle        Equipment Recommendations None recommended  by PT  Recommendations for Other Services       Functional Status Assessment Patient has had a recent decline in their functional status and demonstrates the ability to make significant improvements in function in a reasonable and predictable amount of time.     Precautions / Restrictions Precautions Precautions: Fall Restrictions Weight Bearing Restrictions Per  Provider Order: No      Mobility  Bed Mobility Overal bed mobility: Needs Assistance Bed Mobility: Supine to Sit     Supine to sit: Supervision, Contact guard          Transfers Overall transfer level: Needs assistance Equipment used: None Transfers: Sit to/from Stand, Bed to chair/wheelchair/BSC Sit to Stand: Supervision, Contact guard assist   Step pivot transfers: Supervision, Contact guard assist       General transfer comment: labored movement with increased time    Ambulation/Gait Ambulation/Gait assistance: Contact guard assist Gait Distance (Feet): 35 Feet Assistive device: None Gait Pattern/deviations: Decreased step length - right, Decreased step length - left, Decreased stride length, Shuffle Gait velocity: decreased     General Gait Details: slow slightly labored shuffing steps (which is baseline for patient), no loss of balance and limited mostly due to fatigue  Stairs            Wheelchair Mobility     Tilt Bed    Modified Rankin (Stroke Patients Only)       Balance Overall balance assessment: Needs assistance Sitting-balance support: Feet supported, No upper extremity supported Sitting balance-Leahy Scale: Good Sitting balance - Comments: seated at EOB   Standing balance support: During functional activity, No upper extremity supported Standing balance-Leahy Scale: Fair Standing balance comment: without AD                             Pertinent Vitals/Pain Pain Assessment Pain Assessment: Faces Faces Pain Scale: Hurts a little bit Pain Location: chronic low back pain Pain Descriptors / Indicators: Discomfort, Aching Pain Intervention(s): Limited activity within patient's tolerance, Repositioned    Home Living Family/patient expects to be discharged to:: Private residence Living Arrangements: Spouse/significant other Available Help at Discharge: Family;Available 24 hours/day Type of Home: House Home Access:  Stairs to enter Entrance Stairs-Rails: Right;Left;Can reach both Entrance Stairs-Number of Steps: 1 step into garage, 4 steps in front with BLE side rails   Home Layout: One level Home Equipment: Agricultural consultant (2 wheels);Cane - single point;Shower seat      Prior Function Prior Level of Function : Needs assist       Physical Assist : Mobility (physical);ADLs (physical) Mobility (physical): Bed mobility;Transfers;Gait;Stairs   Mobility Comments: household and short distanced community ambulator without AD, does not drive ADLs Comments: Independent household, assisted for community     Extremity/Trunk Assessment   Upper Extremity Assessment Upper Extremity Assessment: Overall WFL for tasks assessed    Lower Extremity Assessment Lower Extremity Assessment: Generalized weakness    Cervical / Trunk Assessment Cervical / Trunk Assessment: Normal  Communication   Communication Communication: No apparent difficulties  Cognition Arousal: Alert Behavior During Therapy: WFL for tasks assessed/performed Overall Cognitive Status: History of cognitive impairments - at baseline                                          General Comments      Exercises  Assessment/Plan    PT Assessment Patient needs continued PT services  PT Problem List Decreased strength;Decreased activity tolerance;Decreased balance;Decreased mobility       PT Treatment Interventions DME instruction;Gait training;Stair training;Functional mobility training;Therapeutic activities;Therapeutic exercise;Balance training;Patient/family education    PT Goals (Current goals can be found in the Care Plan section)  Acute Rehab PT Goals Patient Stated Goal: return home with family to assist PT Goal Formulation: With patient/family Time For Goal Achievement: 10/01/23 Potential to Achieve Goals: Good    Frequency Min 3X/week     Co-evaluation               AM-PAC PT "6 Clicks"  Mobility  Outcome Measure Help needed turning from your back to your side while in a flat bed without using bedrails?: None Help needed moving from lying on your back to sitting on the side of a flat bed without using bedrails?: A Little Help needed moving to and from a bed to a chair (including a wheelchair)?: A Little Help needed standing up from a chair using your arms (e.g., wheelchair or bedside chair)?: A Little Help needed to walk in hospital room?: A Little Help needed climbing 3-5 steps with a railing? : A Lot 6 Click Score: 18    End of Session   Activity Tolerance: Patient tolerated treatment well;Patient limited by fatigue Patient left: in chair;with call bell/phone within reach;with family/visitor present Nurse Communication: Mobility status PT Visit Diagnosis: Unsteadiness on feet (R26.81);Other abnormalities of gait and mobility (R26.89);Muscle weakness (generalized) (M62.81)    Time: 1030-1057 PT Time Calculation (min) (ACUTE ONLY): 27 min   Charges:   PT Evaluation $PT Eval Moderate Complexity: 1 Mod PT Treatments $Therapeutic Activity: 23-37 mins PT General Charges $$ ACUTE PT VISIT: 1 Visit         2:38 PM, 09/24/23 Ocie Bob, MPT Physical Therapist with [facility] 336 [phone] office [phone] mobile phone

## 2023-09-24 NOTE — Progress Notes (Signed)
PROGRESS NOTE  Michael [name] XBJ:[id number] DOB: 07-12-1941 DOA: 09/23/2023 PCP: Carylon Perches, MD  Brief History:  82 year old male with a history of dementia, hypertension, hyperlipidemia, coronary disease, tobacco abuse in remission presenting with altered mental status.  The patient sustained a mechanical fall while walking his dog on 09/17/2023.  The patient slipped on a stick and wet leaves and fell onto his face.  He was brought to the emergency department at that time.  CT of the brain, was negative for any acute findings.  CT maxillofacial and cervical spine showed there was right periorbital edema with the right.  Supraorbital laceration.  There was no acute fracture or traumatic listhesis of the cervical spine.  His supraorbital laceration was repaired, and the patient was discharged home in stable condition. Wife is at bedside supplementing history.  She states that the patient was able to continue on his usual daily activities on 09/18/2023, 09/19/2023, 09/20/2023, and 09/21/2023.  In fact, the patient continued to walk the dog and perform his activities of daily living without assistance as usual. However, wife states that on 09/22/2023 that the patient was not his usual self.  She felt like he was shuffling more than usual with his gait.  In addition, it appeared that his oral intake was decreased.  Around 3 PM, the patient went into his "sun room" where he basically sat for the next 12 to 18 hours.  His wife would good to check up on him, but the patient was not complaining of anything in particular.  Specifically, the patient had denied any chest pain, shortness breath, abdominal pain, headache, fevers, chills, nausea, vomiting.  The patient complains of chronic back pain.  Apparently, the patient just wanted to be left alone.  Wife noted that the patient was more somnolent.  She stated that when she tried to get him up, the patient was not able to get up from the couch. Apparently,  the patient laid on the couch throughout the night.  When the wife went to check up on him on the morning of 09/23/2023, he remained somnolent and was unable to get up.  As result EMS was activated.  Spouse states that patient has not started any new medications, prescription or over-the-counter.  Prior to his mechanical fall on 09/17/2023, the patient had been in his usual state of health.  Again, even after the fall, the patient was able to ambulate and perform his ADLs without difficulty up until 09/22/2023.  Specifically, the wife did not witness any seizure type activity.  In the ED, the patient was afebrile and hemodynamically stable with oxygen saturation 98% room air.  WBC 5.3, hemoglobin 15.2, platelets 193.  Sodium 140, potassium 3.8, bicarbonate 26, serum creatinine 1.62. CT brain was negative for any acute findings.  CT renal stone protocol was negative for any obstructive uropathy.  There is no acute intra-abdominal findings.  CT L-spine showed a lucency surrounding bilateral S1 pedicle screws concerning for loosening of hardware.  There is persistent moderate to severe bilateral neuroforaminal narrowing L5-S1. Case was discussed with neurosurgery, Dr. Conchita Paris, who felt that the patient did not need any further emergent intervention. UA showed 0-5 WBC.  The patient was admitted for further evaluation and treatment of his encephalopathy.   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 -baseline creatinine 1.6 -improving with IVF   Essential  hypertension -Holding Hyzaar -Monitor BP   Dementia without behavioral disturbance -Continue Aricept and Namenda -MMSE 21/30 noted during neurology visit on 02/24/2022   History of tobacco use, in remission -Patient has 20+ pack years, quit 30 years ago   Hypokalemia -replete -add KCl to IVF -check mag  Rhabdomyolysis -CK 765 -continue IVF -repeat CK          Family Communication:   spouse updated at bedside 12/13  Consultants:  none  Code Status:  FULL   DVT Prophylaxis:  [place] Heparin   Procedures: As Listed in Progress Note Above  Antibiotics: None       Subjective: Patient denies fevers, chills, headache, chest pain, dyspnea, nausea, vomiting, diarrhea, abdominal pain, dysuria,   Objective: Vitals:   09/23/23 2000 09/24/23 0003 09/24/23 0437 09/24/23 0721  BP:  119/60 124/67   Pulse:  64 60   Resp:  20 18   Temp:  98.6 F (37 C) 98.3 F (36.8 C)   TempSrc:  Oral    SpO2: 91% 94% 100% 100%  Weight:      Height:        Intake/Output Summary (Last 24 hours) at 09/24/2023 0910 Last data filed at 09/24/2023 0300 Gross per 24 hour  Intake 1073.75 ml  Output --  Net 1073.75 ml   Weight change:  Exam:  General:  Pt is alert, follows commands appropriately, not in acute distress HEENT: No icterus, No thrush, No neck mass, [place]/AT Cardiovascular: RRR, S1/S2, no rubs, no gallops Respiratory: bibasilar rales.  No wheeze Abdomen: Soft/+BS, non tender, non distended, no guarding Extremities: No edema, No lymphangitis, No petechiae, No rashes, no synovitis   Data Reviewed: I have personally reviewed following labs and imaging studies Basic Metabolic Panel: Recent Labs  Lab 09/23/23 0953 09/24/23 0404  NA 140 136  K 3.8 3.1*  CL 99 99  CO2 26 26  GLUCOSE 107* 92  BUN 31* 36*  CREATININE 1.62* 1.30*  CALCIUM 9.9 8.5*   Liver Function Tests: Recent Labs  Lab 09/23/23 1558 09/24/23 0404  AST 33 31  ALT 12 10  ALKPHOS 64 45   BILITOT 1.6* 1.1  PROT 7.0 5.3*  ALBUMIN 3.9 3.0*   No results for input(s): "LIPASE", "AMYLASE" in the last 168 hours. Recent Labs  Lab 09/23/23 1558  AMMONIA 15   Coagulation Profile: No results for input(s): "INR", "PROTIME" in the last 168 hours. CBC: Recent Labs  Lab 09/23/23 0953 09/24/23 0404  WBC 5.3 4.7  NEUTROABS 3.9  --   HGB 15.2 12.4*  HCT 46.7 39.2  MCV 88.8 89.3  PLT 193 153   Cardiac Enzymes: Recent Labs  Lab 09/23/23 1558  CKTOTAL 765*   BNP: Invalid input(s): "POCBNP" CBG: No results for input(s): "GLUCAP" in the last 168 hours. HbA1C: No results for input(s): "HGBA1C" in the last 72 hours. Urine analysis:    Component Value Date/Time   COLORURINE YELLOW 09/23/2023 1142   APPEARANCEUR HAZY (A) 09/23/2023 1142   LABSPEC 1.025 09/23/2023 1142   PHURINE 5.0 09/23/2023 1142   GLUCOSEU NEGATIVE 09/23/2023 1142   HGBUR MODERATE (A) 09/23/2023 1142   BILIRUBINUR NEGATIVE 09/23/2023 1142   KETONESUR 5 (A) 09/23/2023 1142  PROTEINUR 30 (A) 09/23/2023 1142   NITRITE NEGATIVE 09/23/2023 1142   LEUKOCYTESUR NEGATIVE 09/23/2023 1142   Sepsis Labs: @LABRCNTIP (procalcitonin:4,lacticidven:4) ) Recent Results (from the past 240 hours)  SARS Coronavirus 2 by RT PCR (hospital order, performed in [facility] hospital lab) *cepheid single result test* Anterior Nasal Swab     Status: Abnormal   Collection Time: 09/23/23  3:26 PM   Specimen: Anterior Nasal Swab  Result Value Ref Range Status   SARS Coronavirus 2 by RT PCR POSITIVE (A) NEGATIVE Final    Comment: (NOTE) SARS-CoV-2 target nucleic acids are DETECTED  SARS-CoV-2 RNA is generally detectable in upper respiratory specimens  during the acute phase of infection.  Positive results are indicative  of the presence of the identified virus, but do not rule out bacterial infection or co-infection with other pathogens not detected by the test.  Clinical correlation with patient history and  other  diagnostic information is necessary to determine patient infection status.  The expected result is negative.  Fact Sheet for Patients:   RoadLapTop.co.za   Fact Sheet for Healthcare Providers:   http://kim-miller.com/    This test is not yet approved or cleared by the [place] FDA and  has been authorized for detection and/or diagnosis of SARS-CoV-2 by FDA under an Emergency Use Authorization (EUA).  This EUA will remain in effect (meaning this test can be used) for the duration of  the COVID-19 declaration under Section 564(b)(1)  of the Act, 21 U.S.C. section 360-bbb-3(b)(1), unless the authorization is terminated or revoked sooner.   Performed at [facility], [address], [place], [place] [address]      Scheduled Meds:  cyanocobalamin  1,000 mcg Intramuscular Once   donepezil  10 mg Oral QHS   heparin  5,000 Units Subcutaneous Q8H   LORazepam  0.5 mg Intravenous Once   memantine  10 mg Oral BID   mometasone-formoterol  2 puff Inhalation BID   potassium chloride  40 mEq Oral Once   rosuvastatin  10 mg Oral Daily   Continuous Infusions:  0.9 % NaCl with KCl 20 mEq / L     lactated ringers 75 mL/hr at 09/23/23 1553    Procedures/Studies: EEG adult Result Date: 09/23/2023 Charlsie Quest, MD     09/23/2023  9:49 PM Patient Name: ALVEY CHUKWU MRN: [id number] Epilepsy Attending: Charlsie Quest Referring Physician/Provider: Catarina Hartshorn, MD Date: 09/23/2023 Duration: 23.34 mins Patient history: 82yo M with ams getting eeg to evaluate for seizure Level of alertness: Awake, asleep AEDs during EEG study: None Technical aspects: This EEG study was done with scalp electrodes positioned according to the 10-20 International system of electrode placement. Electrical activity was reviewed with band pass filter of 1-70Hz , sensitivity of 7 uV/mm, display speed of 9mm/sec with a 60Hz  notched filter applied as appropriate. EEG data were  recorded continuously and digitally stored.  Video monitoring was available and reviewed as appropriate. Description: The posterior dominant rhythm consists of 7.5 Hz activity of moderate voltage (25-35 uV) seen predominantly in posterior head regions, symmetric and reactive to eye opening and eye closing. Sleep was characterized by vertex waves, sleep spindles (12 to 14 Hz), maximal frontocentral region. EEG showed intermittent generalized polymorphic sharply contoured 3 to 6 Hz theta-delta slowing. Hyperventilation and photic stimulation were not performed.   ABNORMALITY - Intermittent slow, generalized IMPRESSION: This study is suggestive of mild diffuse encephalopathy. No seizures or epileptiform discharges were seen throughout the recording. Priyanka Annabelle Harman  DG HIP UNILAT WITH PELVIS 2-3 VIEWS LEFT Result Date: 09/23/2023 CLINICAL DATA:  Left hip pain EXAM: DG HIP (WITH OR WITHOUT PELVIS) 2-3V LEFT COMPARISON:  Same day CT FINDINGS: There is no evidence of hip fracture or dislocation. Mild degenerative changes of both hips. Partially imaged lumbosacral fusion hardware. Mild perihardware lucency associated with the bilateral S1 screws. IMPRESSION: 1. No acute fracture or dislocation of the left hip. 2. Mild degenerative changes of both hips. 3. Partially imaged lumbosacral fusion hardware with mild perihardware lucency associated with the bilateral S1 screws, compatible with loosening. Electronically Signed   By: Duanne Guess D.O.   On: 09/23/2023 16:04   CT Head Wo Contrast Result Date: 09/23/2023 CLINICAL DATA:  Provided history: Mental status change, unknown cause. Recent fall. EXAM: CT HEAD WITHOUT CONTRAST TECHNIQUE: Contiguous axial images were obtained from the base of the skull through the vertex without intravenous contrast. RADIATION DOSE REDUCTION: This exam was performed according to the departmental dose-optimization program which includes automated exposure control, adjustment of the  mA and/or kV according to patient size and/or use of iterative reconstruction technique. COMPARISON:  Head CT 09/17/2023.  Brain MRI 02/14/2013. FINDINGS: Brain: Generalized cerebral atrophy. Commensurate prominence of the ventricles and sulci. Patchy and ill-defined hypoattenuation within the cerebral white matter, nonspecific but compatible with mild chronic small vessel ischemic disease. There is no acute intracranial hemorrhage. No demarcated cortical infarct. No extra-axial fluid collection. No evidence of an intracranial mass. No midline shift. Vascular: No hyperdense vessel.  Atherosclerotic calcifications. Skull: No calvarial fracture or aggressive osseous lesion. Sinuses/Orbits: Chronic hyperdensity of the left globe, which may reflect sequela of prior vitreous hemorrhage and/or post-procedural changes. No acute orbital finding. No significant paranasal sinus disease. IMPRESSION: 1. No evidence of an acute intracranial abnormality. 2. Parenchymal atrophy and chronic small vessel ischemic disease. Electronically Signed   By: Jackey Loge D.O.   On: 09/23/2023 13:07   CT Renal Stone Study Result Date: 09/23/2023 CLINICAL DATA:  Abdominal/flank pain, stone suspected. EXAM: CT ABDOMEN AND PELVIS WITHOUT CONTRAST TECHNIQUE: Multidetector CT imaging of the abdomen and pelvis was performed following the standard protocol without IV contrast. RADIATION DOSE REDUCTION: This exam was performed according to the departmental dose-optimization program which includes automated exposure control, adjustment of the mA and/or kV according to patient size and/or use of iterative reconstruction technique. COMPARISON:  None Available. FINDINGS: Lower chest: No acute findings.  Coronary artery calcifications. Hepatobiliary: Benign hepatic cysts, measuring up to 1.6 cm in the left hepatic lobe (no imaging follow-up is recommended). No suspicious liver masses. Cholelithiasis. No gallbladder wall thickening or surrounding  inflammation to suggest acute cholecystitis. No biliary dilatation. Pancreas: Unremarkable. No pancreatic ductal dilatation or surrounding inflammatory changes. Spleen: Normal in size without focal abnormality. Adrenals/Urinary Tract: Adrenal glands are unremarkable. Kidneys are normal, without renal calculi, focal lesion, or hydronephrosis. Bladder is unremarkable. Stomach/Bowel: Normal stomach and duodenum. No dilated loops of small bowel. Normal appendix is visualized on axial image 57 series 3. Sigmoid diverticulosis. No bowel wall thickening or surrounding inflammation. Vascular/Lymphatic: Aortic atherosclerosis. No enlarged abdominal or pelvic lymph nodes. Reproductive: Prostate is unremarkable. Other: No abdominal wall hernia or abnormality. No abdominopelvic ascites. Musculoskeletal: No acute or suspicious bone findings. Please refer to same-day lumbar spine CT report for description of lumbar spine findings. IMPRESSION: 1. No acute findings in the abdomen or pelvis. Specifically, no evidence of obstructive uropathy. 2. Cholelithiasis without evidence of acute cholecystitis. 3. Sigmoid diverticulosis without evidence of acute diverticulitis. Aortic Atherosclerosis (  ICD10-I70.0). Electronically Signed   By: Orvan Falconer M.D.   On: 09/23/2023 11:12   CT L-SPINE NO CHARGE Result Date: 09/23/2023 CLINICAL DATA:  Back pain. Fall 2 weeks ago with persistent lower backache. EXAM: CT LUMBAR SPINE WITHOUT CONTRAST TECHNIQUE: Multidetector CT imaging of the lumbar spine was performed without intravenous contrast administration. Multiplanar CT image reconstructions were also generated. RADIATION DOSE REDUCTION: This exam was performed according to the departmental dose-optimization program which includes automated exposure control, adjustment of the mA and/or kV according to patient size and/or use of iterative reconstruction technique. COMPARISON:  MRI lumbar spine 08/11/2012. FINDINGS: Segmentation:  Conventional numbering is assumed with 5 non-rib-bearing, lumbar type vertebral bodies. Alignment: No listhesis. Vertebrae: Interval postoperative changes of L1-S1 interbody and posterior spinal fusion. Solid bony fusion across the intervening disc spaces from L1-L5. 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 Normal vertebral body heights. No acute fracture. Degenerative endplate changes at T11-12. Paraspinal and other soft tissues: Please refer to same-day abdominal CT report for retroperitoneal findings. Disc levels: No high-grade spinal canal stenosis. Persistent moderate to severe bilateral neural foraminal narrowing at L5-S1. IMPRESSION: 1. No acute fracture or traumatic listhesis of the lumbar spine. 2. Interval postoperative changes of L1-S1 interbody and posterior spinal fusion. Solid bony fusion across the intervening disc spaces from L1-L5. 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 3. Persistent moderate to severe bilateral neural foraminal narrowing at L5-S1. Electronically Signed   By: Orvan Falconer M.D.   On: 09/23/2023 11:05   CT Maxillofacial Wo Contrast Result Date: 09/17/2023 CLINICAL DATA:  Fall while walking dog, laceration to bilateral eyebrows EXAM: CT HEAD WITHOUT CONTRAST CT MAXILLOFACIAL WITHOUT CONTRAST CT CERVICAL SPINE WITHOUT CONTRAST TECHNIQUE: Multidetector CT imaging of the head, cervical spine, and maxillofacial structures were performed using the standard protocol without intravenous contrast. Multiplanar CT image reconstructions of the cervical spine and maxillofacial structures were also generated. RADIATION DOSE REDUCTION: This exam was performed according to the departmental dose-optimization program which includes automated exposure control, adjustment of the mA and/or kV according to patient size and/or use of iterative reconstruction technique.  COMPARISON:  No prior CT head, face, or cervical spine, correlation is made with MRI head 02/14/2013 FINDINGS: CT HEAD FINDINGS Brain: No evidence of acute infarct, hemorrhage, mass, mass effect, or midline shift. No hydrocephalus or extra-axial fluid collection. Age related cerebral volume loss. Mildly more prominent atrophy in the right temporal lobe, with ex vacuo dilatation of the right temporal horn. Vascular: No hyperdense vessel. Atherosclerotic calcifications in the intracranial carotid and vertebral arteries. Skull: Negative for fracture or focal lesion. CT MAXILLOFACIAL FINDINGS Osseous: Possible nasal bone deformity (series 3, images 32-34), although these may be chronic. The nasal septum and anterior nasal spine are intact. No evidence of additional facial bone fracture. No mandibular dislocation. No destructive process. Orbits: No traumatic or inflammatory finding. Sinuses: Mucosal thickening in the right maxillary sinus. Otherwise clear paranasal sinuses. The mastoids are well aerated. Soft tissues: Right periorbital edema. Right supraorbital laceration. CT CERVICAL SPINE FINDINGS Alignment: No traumatic listhesis. Trace retrolisthesis of C3 on C4 and mild retrolisthesis of C4 on C5 appears facet mediated and chronic. Skull base and vertebrae: No acute fracture. No primary bone lesion or focal pathologic process. Soft tissues and spinal canal: No prevertebral fluid or swelling. No visible canal hematoma. Disc levels: Degenerative disc height loss, most prominently at C3-C4 and C4-C5. Moderate to severe spinal  canal stenosis at C4-C5. Upper chest: No focal pulmonary opacity or pleural effusion. Other: None. IMPRESSION: 1. No acute intracranial process. 2. 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 Correlate with point tenderness. 3. Right periorbital edema and right supraorbital laceration. 4. No acute fracture or traumatic listhesis in the cervical spine. Electronically Signed   By:  Wiliam Ke M.D.   On: 09/17/2023 17:42   CT Head Wo Contrast Result Date: 09/17/2023 CLINICAL DATA:  Fall while walking dog, laceration to bilateral eyebrows EXAM: CT HEAD WITHOUT CONTRAST CT MAXILLOFACIAL WITHOUT CONTRAST CT CERVICAL SPINE WITHOUT CONTRAST TECHNIQUE: Multidetector CT imaging of the head, cervical spine, and maxillofacial structures were performed using the standard protocol without intravenous contrast. Multiplanar CT image reconstructions of the cervical spine and maxillofacial structures were also generated. RADIATION DOSE REDUCTION: This exam was performed according to the departmental dose-optimization program which includes automated exposure control, adjustment of the mA and/or kV according to patient size and/or use of iterative reconstruction technique. COMPARISON:  No prior CT head, face, or cervical spine, correlation is made with MRI head 02/14/2013 FINDINGS: CT HEAD FINDINGS Brain: No evidence of acute infarct, hemorrhage, mass, mass effect, or midline shift. No hydrocephalus or extra-axial fluid collection. Age related cerebral volume loss. Mildly more prominent atrophy in the right temporal lobe, with ex vacuo dilatation of the right temporal horn. Vascular: No hyperdense vessel. Atherosclerotic calcifications in the intracranial carotid and vertebral arteries. Skull: Negative for fracture or focal lesion. CT MAXILLOFACIAL FINDINGS Osseous: Possible nasal bone deformity (series 3, images 32-34), although these may be chronic. The nasal septum and anterior nasal spine are intact. No evidence of additional facial bone fracture. No mandibular dislocation. No destructive process. Orbits: No traumatic or inflammatory finding. Sinuses: Mucosal thickening in the right maxillary sinus. Otherwise clear paranasal sinuses. The mastoids are well aerated. Soft tissues: Right periorbital edema. Right supraorbital laceration. CT CERVICAL SPINE FINDINGS Alignment: No traumatic listhesis. Trace  retrolisthesis of C3 on C4 and mild retrolisthesis of C4 on C5 appears facet mediated and chronic. Skull base and vertebrae: No acute fracture. No primary bone lesion or focal pathologic process. Soft tissues and spinal canal: No prevertebral fluid or swelling. No visible canal hematoma. Disc levels: Degenerative disc height loss, most prominently at C3-C4 and C4-C5. 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 Upper chest: No focal pulmonary opacity or pleural effusion. Other: None. IMPRESSION: 1. No acute intracranial process. 2. 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 Correlate with point tenderness. 3. Right periorbital edema and right supraorbital laceration. 4. No acute fracture or traumatic listhesis in the cervical spine. Electronically Signed   By: Wiliam Ke M.D.   On: 09/17/2023 17:42   CT Cervical Spine Wo Contrast Result Date: 09/17/2023 CLINICAL DATA:  Fall while walking dog, laceration to bilateral eyebrows EXAM: CT HEAD WITHOUT CONTRAST CT MAXILLOFACIAL WITHOUT CONTRAST CT CERVICAL SPINE WITHOUT CONTRAST TECHNIQUE: Multidetector CT imaging of the head, cervical spine, and maxillofacial structures were performed using the standard protocol without intravenous contrast. Multiplanar CT image reconstructions of the cervical spine and maxillofacial structures were also generated. RADIATION DOSE REDUCTION: This exam was performed according to the departmental dose-optimization program which includes automated exposure control, adjustment of the mA and/or kV according to patient size and/or use of iterative reconstruction technique. COMPARISON:  No prior CT head, face, or cervical spine, correlation is made with MRI head 02/14/2013 FINDINGS: CT HEAD FINDINGS Brain: No evidence of acute infarct, hemorrhage, mass, mass effect,  or midline shift. No hydrocephalus or extra-axial fluid collection. Age related cerebral volume loss. Mildly more prominent atrophy in the right  temporal lobe, with ex vacuo dilatation of the right temporal horn. Vascular: No hyperdense vessel. Atherosclerotic calcifications in the intracranial carotid and vertebral arteries. Skull: Negative for fracture or focal lesion. CT MAXILLOFACIAL FINDINGS Osseous: Possible nasal bone deformity (series 3, images 32-34), although these may be chronic. The nasal septum and anterior nasal spine are intact. No evidence of additional facial bone fracture. No mandibular dislocation. No destructive process. Orbits: No traumatic or inflammatory finding. Sinuses: Mucosal thickening in the right maxillary sinus. Otherwise clear paranasal sinuses. The mastoids are well aerated. Soft tissues: Right periorbital edema. Right supraorbital laceration. CT CERVICAL SPINE FINDINGS Alignment: No traumatic listhesis. Trace retrolisthesis of C3 on C4 and mild retrolisthesis of C4 on C5 appears facet mediated and chronic. Skull base and vertebrae: No acute fracture. No primary bone lesion or focal pathologic process. Soft tissues and spinal canal: No prevertebral fluid or swelling. No visible canal hematoma. Disc levels: Degenerative disc height loss, most prominently at C3-C4 and C4-C5. 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 Upper chest: No focal pulmonary opacity or pleural effusion. Other: None. IMPRESSION: 1. No acute intracranial process. 2. 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 Correlate with point tenderness. 3. Right periorbital edema and right supraorbital laceration. 4. No acute fracture or traumatic listhesis in the cervical spine. Electronically Signed   By: Wiliam Ke M.D.   On: 09/17/2023 17:42    Catarina Hartshorn, DO  [facility]  If 7PM-7AM, please contact night-coverage [email or web address] Password [email or web address] 09/24/2023, 9:10 AM   LOS: 0 days

## 2023-09-25 ENCOUNTER — Inpatient Hospital Stay (HOSPITAL_COMMUNITY): Payer: Medicare PPO

## 2023-09-25 DIAGNOSIS — G9341 Metabolic encephalopathy: Secondary | ICD-10-CM | POA: Diagnosis not present

## 2023-09-25 DIAGNOSIS — N179 Acute kidney failure, unspecified: Secondary | ICD-10-CM

## 2023-09-25 DIAGNOSIS — U071 COVID-19: Secondary | ICD-10-CM | POA: Diagnosis not present

## 2023-09-25 DIAGNOSIS — F0390 Unspecified dementia without behavioral disturbance: Secondary | ICD-10-CM | POA: Diagnosis not present

## 2023-09-25 LAB — BASIC METABOLIC PANEL
Anion gap: 8 (ref 5–15)
BUN: 30 mg/dL — ABNORMAL HIGH (ref 8–23)
CO2: 23 mmol/L (ref 22–32)
Calcium: 8.4 mg/dL — ABNORMAL LOW (ref 8.9–10.3)
Chloride: 106 mmol/L (ref 98–111)
Creatinine, Ser: 1.14 mg/dL (ref 0.61–1.24)
GFR, Estimated: >60 mL/min (ref >60)
Glucose, Bld: 88 mg/dL (ref 70–99)
Potassium: 3.7 mmol/L (ref 3.5–5.1)
Sodium: 137 mmol/L (ref 135–145)

## 2023-09-25 LAB — CBC
HCT: 38.2 % — ABNORMAL LOW (ref 39.0–52.0)
Hemoglobin: 12.3 g/dL — ABNORMAL LOW (ref 13.0–17.0)
MCH: 29 pg (ref 26.0–34.0)
MCHC: 32.2 g/dL (ref 30.0–36.0)
MCV: 90.1 fL (ref 80.0–100.0)
Platelets: 165 10*3/uL (ref 150–400)
RBC: 4.24 MIL/uL (ref 4.22–5.81)
RDW: 14 % (ref 11.5–15.5)
WBC: 5.6 10*3/uL (ref 4.0–10.5)
nRBC: 0 % (ref 0.0–0.2)

## 2023-09-25 LAB — MAGNESIUM: Magnesium: 1.8 mg/dL (ref 1.7–2.4)

## 2023-09-25 LAB — CK: Total CK: 576 U/L — ABNORMAL HIGH (ref 49–397)

## 2023-09-25 LAB — C-REACTIVE PROTEIN: CRP: 6.5 mg/dL — ABNORMAL HIGH (ref <1.0)

## 2023-09-25 LAB — FERRITIN: Ferritin: 412 ng/mL — ABNORMAL HIGH (ref 24–336)

## 2023-09-25 LAB — PROCALCITONIN: Procalcitonin: <0.1 ng/mL

## 2023-09-25 MED ORDER — CYANOCOBALAMIN 500 MCG PO TABS: 500.0000 ug | ORAL_TABLET | Freq: Every day | ORAL | Status: DC

## 2023-09-25 MED ORDER — VITAMIN B-12 100 MCG PO TABS
500.0000 ug | ORAL_TABLET | Freq: Every day | ORAL | Status: DC
  Administered 2023-09-25: 500 ug via ORAL
  Filled 2023-09-25: qty 5

## 2023-09-25 NOTE — TOC Transition Note (Signed)
Transition of Care [facility]) - Discharge Note   Patient Details  Name: Michael [name] MRN: [id number] Date of Birth: [birth year]  Transition [facility] CM/SW Contact:  Villa Herb, LCSWA Phone Number: 09/25/2023, 11:35 AM   Clinical Narrative:    CSW updated that pt is medically stable to D/C home today. CSW updated Kandee Keen with [facility], they will follow in the community. MD placed [facility] PT orders. TOC signing off.   Final next level of care: Home w Home Health Services Barriers to Discharge: Continued Medical Work up   Patient Goals and CMS Choice Patient states their goals for this hospitalization and ongoing recovery are:: go home CMS Medicare.gov Compare Post Acute Care list provided to:: Patient Represenative (must comment) Choice offered to / list presented to : Spouse      Discharge Placement                       Discharge Plan and Services Additional resources added to the After Visit Summary for   In-house Referral: Clinical Social Work   Post Acute Care Choice: Home Health                    HH Arranged: PT [facility] Agency: [facility] Date [facility] Agency Contacted: 09/24/23   Representative spoke with at [facility] Agency: Kandee Keen  Social Drivers of Health (SDOH) Interventions SDOH Screenings   Food Insecurity: No Food Insecurity (09/24/2023)  Housing: Low Risk  (09/24/2023)  Transportation Needs: No Transportation Needs (09/24/2023)  Utilities: Not At Risk (09/24/2023)  Tobacco Use: Medium Risk (09/23/2023)     Readmission Risk Interventions     No data to display

## 2023-09-25 NOTE — Plan of Care (Signed)
  Problem: Education: Goal: Knowledge of General Education information will improve Description: Including pain rating scale, medication(s)/side effects and non-pharmacologic comfort measures Outcome: Progressing   Problem: Health Behavior/Discharge Planning: Goal: Ability to manage health-related needs will improve Outcome: Progressing   Problem: Clinical Measurements: Goal: Will remain free from infection Outcome: Progressing   Problem: Safety: Goal: Ability to remain free from injury will improve Outcome: Progressing   

## 2023-09-25 NOTE — Discharge Summary (Addendum)
Physician Discharge Summary   Patient: Michael [name] MRN: [id number] DOB: 1941/03/24  Admit date:     09/23/2023  Discharge date: 09/25/23  Discharge Physician: Onalee Hua Alegandro Macnaughton   PCP: Carylon Perches, MD   Recommendations at discharge:   Please follow up with primary care provider within 1-2 weeks  Please repeat BMP and CBC in one week    Hospital Course: 82 year old male with a history of dementia, hypertension, hyperlipidemia, coronary disease, tobacco abuse in remission presenting with altered mental status.  The patient sustained a mechanical fall while walking his dog on 09/17/2023.  The patient slipped on a stick and wet leaves and fell onto his face.  He was brought to the emergency department at that time.  CT of the brain, was negative for any acute findings.  CT maxillofacial and cervical spine showed there was right periorbital edema with the right.  Supraorbital laceration.  There was no acute fracture or traumatic listhesis of the cervical spine.  His supraorbital laceration was repaired, and the patient was discharged home in stable condition. Wife is at bedside supplementing history.  She states that the patient was able to continue on his usual daily activities on 09/18/2023, 09/19/2023, 09/20/2023, and 09/21/2023.  In fact, the patient continued to walk the dog and perform his activities of daily living without assistance as usual. However, wife states that on 09/22/2023 that the patient was not his usual self.  She felt like he was shuffling more than usual with his gait.  In addition, it appeared that his oral intake was decreased.  Around 3 PM, the patient went into his "sun room" where he basically sat for the next 12 to 18 hours.  His wife would good to check up on him, but the patient was not complaining of anything in particular.  Specifically, the patient had denied any chest pain, shortness breath, abdominal pain, headache, fevers, chills, nausea, vomiting.  The patient complains of  chronic back pain.  Apparently, the patient just wanted to be left alone.  Wife noted that the patient was more somnolent.  She stated that when she tried to get him up, the patient was not able to get up from the couch. Apparently, the patient laid on the couch throughout the night.  When the wife went to check up on him on the morning of 09/23/2023, he remained somnolent and was unable to get up.  As result EMS was activated.  Spouse states that patient has not started any new medications, prescription or over-the-counter.  Prior to his mechanical fall on 09/17/2023, the patient had been in his usual state of health.  Again, even after the fall, the patient was able to ambulate and perform his ADLs without difficulty up until 09/22/2023.  Specifically, the wife did not witness any seizure type activity.  In the ED, the patient was afebrile and hemodynamically stable with oxygen saturation 98% room air.  WBC 5.3, hemoglobin 15.2, platelets 193.  Sodium 140, potassium 3.8, bicarbonate 26, serum creatinine 1.62. CT brain was negative for any acute findings.  CT renal stone protocol was negative for any obstructive uropathy.  There is no acute intra-abdominal findings.  CT L-spine showed a lucency surrounding bilateral S1 pedicle screws concerning for loosening of hardware.  There is persistent moderate to severe bilateral neuroforaminal narrowing L5-S1. Case was discussed with neurosurgery, Dr. Conchita Paris, who felt that the patient did not need any further emergent intervention. UA showed 0-5 WBC.  The patient was admitted for further  evaluation and treatment of his encephalopathy.  Assessment and Plan: Acute metabolic encephalopathy -Due to COVID 19 infection and dehydration/AKI -UA negative for pyuria -CT negative for acute findings -MRI brain--negative for acute findings -TSH--0.544 -B12--146>>replete>> 1000 mcg IM x1 given>> sent home with p.o. supplement -Folic acid--10.6 -CPK--765 -EEG--no  seizure -VBG--7.38/51/<31/30 -continued IVF -Continues to improve, near baseline at the time of discharge   COVID-19 infection -Personally reviewed CXR--infiltrates or edema -stable on RA -Incentive spirometry   Generalized weakness -Work up as above -PT eval>> Home health PT   AKI -baseline creatinine 0.9-1.1 -Presented with serum creatinine 1.62 -improving with IVF -Serum creatinine 1.14 at the time of discharge   Essential hypertension -Holding Hyzaar -Monitor BP -BP remains well-controlled during hospitalization -Do not plan to restart Hyzaar -Check BPs at home and follow-up with PCP   Dementia without behavioral disturbance -Continue Aricept and Namenda -MMSE 21/30 noted during neurology visit on 02/24/2022   History of tobacco use, in remission -Patient has 20+ pack years, quit 30 years ago   Hypokalemia -repleted -Magnesium 1.8 -added KCl to IVF   Rhabdomyolysis -CK 765>>576 -continued IVF         Consultants: none Procedures performed: none  Disposition: Home Diet recommendation:  Regular diet DISCHARGE MEDICATION: Allergies as of 09/25/2023   No Known Allergies      Medication List     STOP taking these medications    losartan-hydrochlorothiazide 100-12.5 MG tablet Commonly known as: HYZAAR       TAKE these medications    albuterol 108 (90 Base) MCG/ACT inhaler Commonly known as: VENTOLIN HFA Inhale into the lungs every 6 (six) hours as needed for wheezing or shortness of breath.   cyanocobalamin 500 MCG tablet Commonly known as: VITAMIN B12 Take 1 tablet (500 mcg total) by mouth daily.   donepezil 10 MG tablet Commonly known as: ARICEPT Take 10 mg by mouth at bedtime.   levocetirizine 5 MG tablet Commonly known as: XYZAL Take 5 mg by mouth every evening.   memantine 10 MG tablet Commonly known as: NAMENDA Take 1 tablet (10 mg total) by mouth 2 (two) times daily.   rosuvastatin 10 MG tablet Commonly known as:  CRESTOR Take 10 mg by mouth daily.        Follow-up Information     Care, [facility] Follow up.   Specialty: Home Health Services Why: [facility] staff will call you to schedule visits Contact information: [address] [place] [address] [phone]                Discharge Exam: Filed Weights   09/23/23 0935  Weight: 63.5 kg   HEENT:  [place]/AT, No thrush, no icterus CV:  RRR, no rub, no S3, no S4 Lung: Bibasilar rales.  No wheeze Abd:  soft/+BS, NT Ext:  No edema, no lymphangitis, no synovitis, no rash   Condition at discharge: stable  The results of significant diagnostics from this hospitalization (including imaging, microbiology, ancillary and laboratory) are listed below for reference.   Imaging Studies: MR BRAIN WO CONTRAST Result Date: 09/24/2023 CLINICAL DATA:  Initial evaluation for acute mental status change, unknown cause. EXAM: MRI HEAD WITHOUT CONTRAST TECHNIQUE: Multiplanar, multiecho pulse sequences of the brain and surrounding structures were obtained without intravenous contrast. COMPARISON:  Prior CT from 09/23/2023. FINDINGS: Brain: Moderately advanced age-related cerebral atrophy. Patchy T2/FLAIR hyperintensity involving the periventricular and deep white matter both cerebral hemispheres, consistent with chronic small vessel ischemic disease, mild to  moderate in nature. No evidence for acute or subacute ischemia. Gray-white matter differentiation maintained. No areas of chronic cortical infarction. No acute intracranial hemorrhage. Few small foci of chronic hemosiderin staining noted at the left occipital lobe. No other chronic intracranial blood products. No mass lesion, midline shift or mass effect. Ventricular prominence related to global parenchymal volume loss without hydrocephalus. No extra-axial fluid collection. Pituitary gland suprasellar region within normal limits. Vascular: Major intracranial vascular flow voids  are maintained. Skull and upper cervical spine: Craniocervical junction within normal limits. Bone marrow signal intensity within normal limits. Degenerative spondylosis noted at the partially visualized C4-5 interspace. No scalp soft tissue abnormality. Sinuses/Orbits: Prior bilateral ocular lens replacement with postoperative changes at the left lobe. Paranasal sinuses are largely clear. Trace right mastoid effusion, of doubtful significance. Other: None. IMPRESSION: 1. No acute intracranial abnormality. 2. Moderately advanced age-related cerebral atrophy with mild to moderate chronic microvascular ischemic disease. Electronically Signed   By: Rise Mu M.D.   On: 09/24/2023 19:29   EEG adult Result Date: 09/23/2023 Charlsie Quest, MD     09/23/2023  9:49 PM Patient Name: NAVID FUDGE MRN: [id number] Epilepsy Attending: Charlsie Quest Referring Physician/Provider: Catarina Hartshorn, MD Date: 09/23/2023 Duration: 23.34 mins Patient history: 82yo M with ams getting eeg to evaluate for seizure Level of alertness: Awake, asleep AEDs during EEG study: None Technical aspects: This EEG study was done with scalp electrodes positioned according to the 10-20 International system of electrode placement. Electrical activity was reviewed with band pass filter of 1-70Hz , sensitivity of 7 uV/mm, display speed of 60mm/sec with a 60Hz  notched filter applied as appropriate. EEG data were recorded continuously and digitally stored.  Video monitoring was available and reviewed as appropriate. Description: The posterior dominant rhythm consists of 7.5 Hz activity of moderate voltage (25-35 uV) seen predominantly in posterior head regions, symmetric and reactive to eye opening and eye closing. Sleep was characterized by vertex waves, sleep spindles (12 to 14 Hz), maximal frontocentral region. EEG showed intermittent generalized polymorphic sharply contoured 3 to 6 Hz theta-delta slowing. Hyperventilation and photic  stimulation were not performed.   ABNORMALITY - Intermittent slow, generalized IMPRESSION: This study is suggestive of mild diffuse encephalopathy. No seizures or epileptiform discharges were seen throughout the recording. Priyanka Annabelle Harman   DG HIP UNILAT WITH PELVIS 2-3 VIEWS LEFT Result Date: 09/23/2023 CLINICAL DATA:  Left hip pain EXAM: DG HIP (WITH OR WITHOUT PELVIS) 2-3V LEFT COMPARISON:  Same day CT FINDINGS: There is no evidence of hip fracture or dislocation. Mild degenerative changes of both hips. Partially imaged lumbosacral fusion hardware. Mild perihardware lucency associated with the bilateral S1 screws. IMPRESSION: 1. No acute fracture or dislocation of the left hip. 2. Mild degenerative changes of both hips. 3. Partially imaged lumbosacral fusion hardware with mild perihardware lucency associated with the bilateral S1 screws, compatible with loosening. Electronically Signed   By: Duanne Guess D.O.   On: 09/23/2023 16:04   CT Head Wo Contrast Result Date: 09/23/2023 CLINICAL DATA:  Provided history: Mental status change, unknown cause. Recent fall. EXAM: CT HEAD WITHOUT CONTRAST TECHNIQUE: Contiguous axial images were obtained from the base of the skull through the vertex without intravenous contrast. RADIATION DOSE REDUCTION: This exam was performed according to the departmental dose-optimization program which includes automated exposure control, adjustment of the mA and/or kV according to patient size and/or use of iterative reconstruction technique. COMPARISON:  Head CT 09/17/2023.  Brain MRI 02/14/2013. FINDINGS: Brain: Generalized cerebral atrophy.  Commensurate prominence of the ventricles and sulci. Patchy and ill-defined hypoattenuation within the cerebral white matter, nonspecific but compatible with mild chronic small vessel ischemic disease. There is no acute intracranial hemorrhage. No demarcated cortical infarct. No extra-axial fluid collection. No evidence of an intracranial  mass. No midline shift. Vascular: No hyperdense vessel.  Atherosclerotic calcifications. Skull: No calvarial fracture or aggressive osseous lesion. Sinuses/Orbits: Chronic hyperdensity of the left globe, which may reflect sequela of prior vitreous hemorrhage and/or post-procedural changes. No acute orbital finding. No significant paranasal sinus disease. IMPRESSION: 1. No evidence of an acute intracranial abnormality. 2. Parenchymal atrophy and chronic small vessel ischemic disease. Electronically Signed   By: Jackey Loge D.O.   On: 09/23/2023 13:07   CT Renal Stone Study Result Date: 09/23/2023 CLINICAL DATA:  Abdominal/flank pain, stone suspected. EXAM: CT ABDOMEN AND PELVIS WITHOUT CONTRAST TECHNIQUE: Multidetector CT imaging of the abdomen and pelvis was performed following the standard protocol without IV contrast. RADIATION DOSE REDUCTION: This exam was performed according to the departmental dose-optimization program which includes automated exposure control, adjustment of the mA and/or kV according to patient size and/or use of iterative reconstruction technique. COMPARISON:  None Available. FINDINGS: Lower chest: No acute findings.  Coronary artery calcifications. Hepatobiliary: Benign hepatic cysts, measuring up to 1.6 cm in the left hepatic lobe (no imaging follow-up is recommended). No suspicious liver masses. Cholelithiasis. No gallbladder wall thickening or surrounding inflammation to suggest acute cholecystitis. No biliary dilatation. Pancreas: Unremarkable. No pancreatic ductal dilatation or surrounding inflammatory changes. Spleen: Normal in size without focal abnormality. Adrenals/Urinary Tract: Adrenal glands are unremarkable. Kidneys are normal, without renal calculi, focal lesion, or hydronephrosis. Bladder is unremarkable. Stomach/Bowel: Normal stomach and duodenum. No dilated loops of small bowel. Normal appendix is visualized on axial image 57 series 3. Sigmoid diverticulosis. No bowel  wall thickening or surrounding inflammation. Vascular/Lymphatic: Aortic atherosclerosis. No enlarged abdominal or pelvic lymph nodes. Reproductive: Prostate is unremarkable. Other: No abdominal wall hernia or abnormality. No abdominopelvic ascites. Musculoskeletal: No acute or suspicious bone findings. Please refer to same-day lumbar spine CT report for description of lumbar spine findings. IMPRESSION: 1. No acute findings in the abdomen or pelvis. Specifically, no evidence of obstructive uropathy. 2. Cholelithiasis without evidence of acute cholecystitis. 3. Sigmoid diverticulosis without evidence of acute diverticulitis. Aortic Atherosclerosis (ICD10-I70.0). Electronically Signed   By: Orvan Falconer M.D.   On: 09/23/2023 11:12   CT L-SPINE NO CHARGE Result Date: 09/23/2023 CLINICAL DATA:  Back pain. Fall 2 weeks ago with persistent lower backache. EXAM: CT LUMBAR SPINE WITHOUT CONTRAST TECHNIQUE: Multidetector CT imaging of the lumbar spine was performed without intravenous contrast administration. Multiplanar CT image reconstructions were also generated. RADIATION DOSE REDUCTION: This exam was performed according to the departmental dose-optimization program which includes automated exposure control, adjustment of the mA and/or kV according to patient size and/or use of iterative reconstruction technique. COMPARISON:  MRI lumbar spine 08/11/2012. FINDINGS: Segmentation: Conventional numbering is assumed with 5 non-rib-bearing, lumbar type vertebral bodies. Alignment: No listhesis. Vertebrae: Interval postoperative changes of L1-S1 interbody and posterior spinal fusion. Solid bony fusion across the intervening disc spaces from L1-L5. 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 Normal vertebral body heights. No acute fracture. Degenerative endplate changes at T11-12. Paraspinal and other soft tissues: Please refer to same-day abdominal CT report for  retroperitoneal findings. Disc levels: No high-grade spinal canal stenosis. Persistent moderate to severe bilateral neural foraminal narrowing at L5-S1. IMPRESSION: 1. No acute  fracture or traumatic listhesis of the lumbar spine. 2. Interval postoperative changes of L1-S1 interbody and posterior spinal fusion. Solid bony fusion across the intervening disc spaces from L1-L5. 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 3. Persistent moderate to severe bilateral neural foraminal narrowing at L5-S1. Electronically Signed   By: Orvan Falconer M.D.   On: 09/23/2023 11:05   CT Maxillofacial Wo Contrast Result Date: 09/17/2023 CLINICAL DATA:  Fall while walking dog, laceration to bilateral eyebrows EXAM: CT HEAD WITHOUT CONTRAST CT MAXILLOFACIAL WITHOUT CONTRAST CT CERVICAL SPINE WITHOUT CONTRAST TECHNIQUE: Multidetector CT imaging of the head, cervical spine, and maxillofacial structures were performed using the standard protocol without intravenous contrast. Multiplanar CT image reconstructions of the cervical spine and maxillofacial structures were also generated. RADIATION DOSE REDUCTION: This exam was performed according to the departmental dose-optimization program which includes automated exposure control, adjustment of the mA and/or kV according to patient size and/or use of iterative reconstruction technique. COMPARISON:  No prior CT head, face, or cervical spine, correlation is made with MRI head 02/14/2013 FINDINGS: CT HEAD FINDINGS Brain: No evidence of acute infarct, hemorrhage, mass, mass effect, or midline shift. No hydrocephalus or extra-axial fluid collection. Age related cerebral volume loss. Mildly more prominent atrophy in the right temporal lobe, with ex vacuo dilatation of the right temporal horn. Vascular: No hyperdense vessel. Atherosclerotic calcifications in the intracranial carotid and vertebral arteries. Skull: Negative for fracture  or focal lesion. CT MAXILLOFACIAL FINDINGS Osseous: Possible nasal bone deformity (series 3, images 32-34), although these may be chronic. The nasal septum and anterior nasal spine are intact. No evidence of additional facial bone fracture. No mandibular dislocation. No destructive process. Orbits: No traumatic or inflammatory finding. Sinuses: Mucosal thickening in the right maxillary sinus. Otherwise clear paranasal sinuses. The mastoids are well aerated. Soft tissues: Right periorbital edema. Right supraorbital laceration. CT CERVICAL SPINE FINDINGS Alignment: No traumatic listhesis. Trace retrolisthesis of C3 on C4 and mild retrolisthesis of C4 on C5 appears facet mediated and chronic. Skull base and vertebrae: No acute fracture. No primary bone lesion or focal pathologic process. Soft tissues and spinal canal: No prevertebral fluid or swelling. No visible canal hematoma. Disc levels: Degenerative disc height loss, most prominently at C3-C4 and C4-C5. 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 Upper chest: No focal pulmonary opacity or pleural effusion. Other: None. IMPRESSION: 1. No acute intracranial process. 2. 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 Correlate with point tenderness. 3. Right periorbital edema and right supraorbital laceration. 4. No acute fracture or traumatic listhesis in the cervical spine. Electronically Signed   By: Wiliam Ke M.D.   On: 09/17/2023 17:42   CT Head Wo Contrast Result Date: 09/17/2023 CLINICAL DATA:  Fall while walking dog, laceration to bilateral eyebrows EXAM: CT HEAD WITHOUT CONTRAST CT MAXILLOFACIAL WITHOUT CONTRAST CT CERVICAL SPINE WITHOUT CONTRAST TECHNIQUE: Multidetector CT imaging of the head, cervical spine, and maxillofacial structures were performed using the standard protocol without intravenous contrast. Multiplanar CT image reconstructions of the cervical spine and maxillofacial structures were also generated. RADIATION  DOSE REDUCTION: This exam was performed according to the departmental dose-optimization program which includes automated exposure control, adjustment of the mA and/or kV according to patient size and/or use of iterative reconstruction technique. COMPARISON:  No prior CT head, face, or cervical spine, correlation is made with MRI head 02/14/2013 FINDINGS: CT HEAD FINDINGS Brain: No evidence of acute infarct, hemorrhage, mass, mass effect, or  midline shift. No hydrocephalus or extra-axial fluid collection. Age related cerebral volume loss. Mildly more prominent atrophy in the right temporal lobe, with ex vacuo dilatation of the right temporal horn. Vascular: No hyperdense vessel. Atherosclerotic calcifications in the intracranial carotid and vertebral arteries. Skull: Negative for fracture or focal lesion. CT MAXILLOFACIAL FINDINGS Osseous: Possible nasal bone deformity (series 3, images 32-34), although these may be chronic. The nasal septum and anterior nasal spine are intact. No evidence of additional facial bone fracture. No mandibular dislocation. No destructive process. Orbits: No traumatic or inflammatory finding. Sinuses: Mucosal thickening in the right maxillary sinus. Otherwise clear paranasal sinuses. The mastoids are well aerated. Soft tissues: Right periorbital edema. Right supraorbital laceration. CT CERVICAL SPINE FINDINGS Alignment: No traumatic listhesis. Trace retrolisthesis of C3 on C4 and mild retrolisthesis of C4 on C5 appears facet mediated and chronic. Skull base and vertebrae: No acute fracture. No primary bone lesion or focal pathologic process. Soft tissues and spinal canal: No prevertebral fluid or swelling. No visible canal hematoma. Disc levels: Degenerative disc height loss, most prominently at C3-C4 and C4-C5. 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 Upper chest: No focal pulmonary opacity or pleural effusion. Other: None. IMPRESSION: 1. No acute intracranial process. 2.  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 Correlate with point tenderness. 3. Right periorbital edema and right supraorbital laceration. 4. No acute fracture or traumatic listhesis in the cervical spine. Electronically Signed   By: Wiliam Ke M.D.   On: 09/17/2023 17:42   CT Cervical Spine Wo Contrast Result Date: 09/17/2023 CLINICAL DATA:  Fall while walking dog, laceration to bilateral eyebrows EXAM: CT HEAD WITHOUT CONTRAST CT MAXILLOFACIAL WITHOUT CONTRAST CT CERVICAL SPINE WITHOUT CONTRAST TECHNIQUE: Multidetector CT imaging of the head, cervical spine, and maxillofacial structures were performed using the standard protocol without intravenous contrast. Multiplanar CT image reconstructions of the cervical spine and maxillofacial structures were also generated. RADIATION DOSE REDUCTION: This exam was performed according to the departmental dose-optimization program which includes automated exposure control, adjustment of the mA and/or kV according to patient size and/or use of iterative reconstruction technique. COMPARISON:  No prior CT head, face, or cervical spine, correlation is made with MRI head 02/14/2013 FINDINGS: CT HEAD FINDINGS Brain: No evidence of acute infarct, hemorrhage, mass, mass effect, or midline shift. No hydrocephalus or extra-axial fluid collection. Age related cerebral volume loss. Mildly more prominent atrophy in the right temporal lobe, with ex vacuo dilatation of the right temporal horn. Vascular: No hyperdense vessel. Atherosclerotic calcifications in the intracranial carotid and vertebral arteries. Skull: Negative for fracture or focal lesion. CT MAXILLOFACIAL FINDINGS Osseous: Possible nasal bone deformity (series 3, images 32-34), although these may be chronic. The nasal septum and anterior nasal spine are intact. No evidence of additional facial bone fracture. No mandibular dislocation. No destructive process. Orbits: No traumatic or inflammatory finding.  Sinuses: Mucosal thickening in the right maxillary sinus. Otherwise clear paranasal sinuses. The mastoids are well aerated. Soft tissues: Right periorbital edema. Right supraorbital laceration. CT CERVICAL SPINE FINDINGS Alignment: No traumatic listhesis. Trace retrolisthesis of C3 on C4 and mild retrolisthesis of C4 on C5 appears facet mediated and chronic. Skull base and vertebrae: No acute fracture. No primary bone lesion or focal pathologic process. Soft tissues and spinal canal: No prevertebral fluid or swelling. No visible canal hematoma. Disc levels: Degenerative disc height loss, most prominently at C3-C4 and C4-C5. 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 Upper chest: No focal pulmonary opacity  or pleural effusion. Other: None. IMPRESSION: 1. No acute intracranial process. 2. 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 Correlate with point tenderness. 3. Right periorbital edema and right supraorbital laceration. 4. No acute fracture or traumatic listhesis in the cervical spine. Electronically Signed   By: Wiliam Ke M.D.   On: 09/17/2023 17:42    Microbiology: Results for orders placed or performed during the hospital encounter of 09/23/23  SARS Coronavirus 2 by RT PCR (hospital order, performed in [facility] hospital lab) *cepheid single result test* Anterior Nasal Swab     Status: Abnormal   Collection Time: 09/23/23  3:26 PM   Specimen: Anterior Nasal Swab  Result Value Ref Range Status   SARS Coronavirus 2 by RT PCR POSITIVE (A) NEGATIVE Final    Comment: (NOTE) SARS-CoV-2 target nucleic acids are DETECTED  SARS-CoV-2 RNA is generally detectable in upper respiratory specimens  during the acute phase of infection.  Positive results are indicative  of the presence of the identified virus, but do not rule out bacterial infection or co-infection with other pathogens not detected by the test.  Clinical correlation with patient history and  other diagnostic  information is necessary to determine patient infection status.  The expected result is negative.  Fact Sheet for Patients:   RoadLapTop.co.za   Fact Sheet for Healthcare Providers:   http://kim-miller.com/    This test is not yet approved or cleared by the [place] FDA and  has been authorized for detection and/or diagnosis of SARS-CoV-2 by FDA under an Emergency Use Authorization (EUA).  This EUA will remain in effect (meaning this test can be used) for the duration of  the COVID-19 declaration under Section 564(b)(1)  of the Act, 21 U.S.C. section 360-bbb-3(b)(1), unless the authorization is terminated or revoked sooner.   Performed at [facility], [address], [place], [place] [address]     Labs: CBC: Recent Labs  Lab 09/23/23 0953 09/24/23 0404  WBC 5.3 4.7  NEUTROABS 3.9  --   HGB 15.2 12.4*  HCT 46.7 39.2  MCV 88.8 89.3  PLT 193 153   Basic Metabolic Panel: Recent Labs  Lab 09/23/23 0953 09/24/23 0404 09/25/23 0417  NA 140 136 137  K 3.8 3.1* 3.7  CL 99 99 106  CO2 26 26 23   GLUCOSE 107* 92 88  BUN 31* 36* 30*  CREATININE 1.62* 1.30* 1.14  CALCIUM 9.9 8.5* 8.4*  MG  --   --  1.8   Liver Function Tests: Recent Labs  Lab 09/23/23 1558 09/24/23 0404  AST 33 31  ALT 12 10  ALKPHOS 64 45  BILITOT 1.6* 1.1  PROT 7.0 5.3*  ALBUMIN 3.9 3.0*   CBG: No results for input(s): "GLUCAP" in the last 168 hours.  Discharge time spent: greater than 30 minutes.  Signed: Catarina Hartshorn, MD [facility] 09/25/2023

## 2023-09-25 NOTE — Plan of Care (Signed)
  Problem: Education: Goal: Knowledge of General Education information will improve Description: Including pain rating scale, medication(s)/side effects and non-pharmacologic comfort measures Outcome: Progressing   Problem: Health Behavior/Discharge Planning: Goal: Ability to manage health-related needs will improve Outcome: Progressing   Problem: Clinical Measurements: Goal: Ability to maintain clinical measurements within normal limits will improve Outcome: Progressing Goal: Will remain free from infection Outcome: Progressing Goal: Diagnostic test results will improve Outcome: Progressing Goal: Respiratory complications will improve Outcome: Progressing Goal: Cardiovascular complication will be avoided Outcome: Progressing   Problem: Activity: Goal: Risk for activity intolerance will decrease Outcome: Progressing   Problem: Nutrition: Goal: Adequate nutrition will be maintained Outcome: Progressing   Problem: Coping: Goal: Level of anxiety will decrease Outcome: Progressing   Problem: Elimination: Goal: Will not experience complications related to bowel motility Outcome: Progressing Goal: Will not experience complications related to urinary retention Outcome: Progressing   Problem: Pain Management: Goal: General experience of comfort will improve Outcome: Progressing   Problem: Safety: Goal: Ability to remain free from injury will improve Outcome: Progressing   Problem: Skin Integrity: Goal: Risk for impaired skin integrity will decrease Outcome: Progressing   Problem: Education: Goal: Knowledge of risk factors and measures for prevention of condition will improve Outcome: Progressing   Problem: Coping: Goal: Psychosocial and spiritual needs will be supported Outcome: Progressing   Problem: Respiratory: Goal: Will maintain a patent airway Outcome: Progressing Goal: Complications related to the disease process, condition or treatment will be avoided or  minimized Outcome: Progressing

## 2023-09-27 ENCOUNTER — Ambulatory Visit: Payer: Self-pay | Admitting: *Deleted

## 2023-09-27 ENCOUNTER — Other Ambulatory Visit: Payer: Self-pay

## 2023-09-27 ENCOUNTER — Telehealth: Payer: Self-pay | Admitting: *Deleted

## 2023-09-27 ENCOUNTER — Inpatient Hospital Stay (HOSPITAL_COMMUNITY)
Admission: EM | Admit: 2023-09-27 | Discharge: 2023-10-01 | DRG: 557 | Disposition: A | Discharge status: Skilled Nursing Facility | Payer: Medicare PPO | Attending: Internal Medicine | Admitting: Internal Medicine

## 2023-09-27 ENCOUNTER — Encounter (HOSPITAL_COMMUNITY): Payer: Self-pay | Admitting: Emergency Medicine

## 2023-09-27 ENCOUNTER — Emergency Department (HOSPITAL_COMMUNITY): Payer: Medicare PPO

## 2023-09-27 ENCOUNTER — Telehealth: Payer: Self-pay

## 2023-09-27 ENCOUNTER — Encounter: Payer: Self-pay | Admitting: *Deleted

## 2023-09-27 DIAGNOSIS — R627 Adult failure to thrive: Principal | ICD-10-CM

## 2023-09-27 DIAGNOSIS — Z87891 Personal history of nicotine dependence: Secondary | ICD-10-CM | POA: Diagnosis not present

## 2023-09-27 DIAGNOSIS — F0390 Unspecified dementia without behavioral disturbance: Secondary | ICD-10-CM

## 2023-09-27 DIAGNOSIS — E860 Dehydration: Secondary | ICD-10-CM | POA: Diagnosis present

## 2023-09-27 DIAGNOSIS — I2510 Atherosclerotic heart disease of native coronary artery without angina pectoris: Secondary | ICD-10-CM | POA: Diagnosis not present

## 2023-09-27 DIAGNOSIS — R748 Abnormal levels of other serum enzymes: Secondary | ICD-10-CM | POA: Diagnosis present

## 2023-09-27 DIAGNOSIS — Z9842 Cataract extraction status, left eye: Secondary | ICD-10-CM

## 2023-09-27 DIAGNOSIS — Z6821 Body mass index (BMI) 21.0-21.9, adult: Secondary | ICD-10-CM | POA: Diagnosis not present

## 2023-09-27 DIAGNOSIS — G9341 Metabolic encephalopathy: Secondary | ICD-10-CM

## 2023-09-27 DIAGNOSIS — U071 COVID-19: Secondary | ICD-10-CM | POA: Diagnosis not present

## 2023-09-27 DIAGNOSIS — N179 Acute kidney failure, unspecified: Secondary | ICD-10-CM | POA: Diagnosis present

## 2023-09-27 DIAGNOSIS — I771 Stricture of artery: Secondary | ICD-10-CM | POA: Diagnosis not present

## 2023-09-27 DIAGNOSIS — I10 Essential (primary) hypertension: Secondary | ICD-10-CM | POA: Diagnosis not present

## 2023-09-27 DIAGNOSIS — Z96611 Presence of right artificial shoulder joint: Secondary | ICD-10-CM | POA: Diagnosis present

## 2023-09-27 DIAGNOSIS — Z9841 Cataract extraction status, right eye: Secondary | ICD-10-CM | POA: Diagnosis not present

## 2023-09-27 DIAGNOSIS — R0902 Hypoxemia: Secondary | ICD-10-CM

## 2023-09-27 DIAGNOSIS — Z66 Do not resuscitate: Secondary | ICD-10-CM | POA: Diagnosis present

## 2023-09-27 DIAGNOSIS — R262 Difficulty in walking, not elsewhere classified: Secondary | ICD-10-CM

## 2023-09-27 DIAGNOSIS — I129 Hypertensive chronic kidney disease with stage 1 through stage 4 chronic kidney disease, or unspecified chronic kidney disease: Secondary | ICD-10-CM | POA: Diagnosis not present

## 2023-09-27 DIAGNOSIS — E785 Hyperlipidemia, unspecified: Secondary | ICD-10-CM | POA: Diagnosis present

## 2023-09-27 DIAGNOSIS — N1832 Chronic kidney disease, stage 3b: Secondary | ICD-10-CM | POA: Diagnosis present

## 2023-09-27 DIAGNOSIS — I700 Atherosclerosis of aorta: Secondary | ICD-10-CM | POA: Diagnosis not present

## 2023-09-27 DIAGNOSIS — Z79899 Other long term (current) drug therapy: Secondary | ICD-10-CM | POA: Diagnosis not present

## 2023-09-27 DIAGNOSIS — M6282 Rhabdomyolysis: Secondary | ICD-10-CM | POA: Diagnosis not present

## 2023-09-27 DIAGNOSIS — G8929 Other chronic pain: Secondary | ICD-10-CM | POA: Diagnosis present

## 2023-09-27 DIAGNOSIS — M545 Low back pain, unspecified: Secondary | ICD-10-CM | POA: Diagnosis present

## 2023-09-27 DIAGNOSIS — Z961 Presence of intraocular lens: Secondary | ICD-10-CM | POA: Diagnosis present

## 2023-09-27 DIAGNOSIS — Z981 Arthrodesis status: Secondary | ICD-10-CM

## 2023-09-27 LAB — URINALYSIS, ROUTINE W REFLEX MICROSCOPIC
Bilirubin Urine: NEGATIVE
Glucose, UA: NEGATIVE mg/dL
Ketones, ur: NEGATIVE mg/dL
Leukocytes,Ua: NEGATIVE
Nitrite: NEGATIVE
Protein, ur: 30 mg/dL — AB
Specific Gravity, Urine: 1.016 (ref 1.005–1.030)
pH: 5 (ref 5.0–8.0)

## 2023-09-27 LAB — CBC WITH DIFFERENTIAL/PLATELET
Abs Immature Granulocytes: 0.06 10*3/uL (ref 0.00–0.07)
Basophils Absolute: 0 10*3/uL (ref 0.0–0.1)
Basophils Relative: 0 %
Eosinophils Absolute: 0 10*3/uL (ref 0.0–0.5)
Eosinophils Relative: 0 %
HCT: 39.9 % (ref 39.0–52.0)
Hemoglobin: 12.8 g/dL — ABNORMAL LOW (ref 13.0–17.0)
Immature Granulocytes: 1 %
Lymphocytes Relative: 4 %
Lymphs Abs: 0.4 10*3/uL — ABNORMAL LOW (ref 0.7–4.0)
MCH: 28.5 pg (ref 26.0–34.0)
MCHC: 32.1 g/dL (ref 30.0–36.0)
MCV: 88.9 fL (ref 80.0–100.0)
Monocytes Absolute: 0.5 10*3/uL (ref 0.1–1.0)
Monocytes Relative: 5 %
Neutro Abs: 8.6 10*3/uL — ABNORMAL HIGH (ref 1.7–7.7)
Neutrophils Relative %: 90 %
Platelets: 193 10*3/uL (ref 150–400)
RBC: 4.49 MIL/uL (ref 4.22–5.81)
RDW: 13.7 % (ref 11.5–15.5)
WBC: 9.5 10*3/uL (ref 4.0–10.5)
nRBC: 0 % (ref 0.0–0.2)

## 2023-09-27 LAB — COMPREHENSIVE METABOLIC PANEL
ALT: 24 U/L (ref 0–44)
AST: 95 U/L — ABNORMAL HIGH (ref 15–41)
Albumin: 3 g/dL — ABNORMAL LOW (ref 3.5–5.0)
Alkaline Phosphatase: 46 U/L (ref 38–126)
Anion gap: 12 (ref 5–15)
BUN: 39 mg/dL — ABNORMAL HIGH (ref 8–23)
CO2: 26 mmol/L (ref 22–32)
Calcium: 9.4 mg/dL (ref 8.9–10.3)
Chloride: 107 mmol/L (ref 98–111)
Creatinine, Ser: 1.39 mg/dL — ABNORMAL HIGH (ref 0.61–1.24)
GFR, Estimated: 50 mL/min — ABNORMAL LOW (ref >60)
Glucose, Bld: 105 mg/dL — ABNORMAL HIGH (ref 70–99)
Potassium: 3.5 mmol/L (ref 3.5–5.1)
Sodium: 145 mmol/L (ref 135–145)
Total Bilirubin: 1 mg/dL (ref <1.2)
Total Protein: 6.2 g/dL — ABNORMAL LOW (ref 6.5–8.1)

## 2023-09-27 LAB — LACTIC ACID, PLASMA: Lactic Acid, Venous: 1.5 mmol/L (ref 0.5–1.9)

## 2023-09-27 LAB — LIPASE, BLOOD: Lipase: 32 U/L (ref 11–51)

## 2023-09-27 LAB — PROCALCITONIN: Procalcitonin: 2.64 ng/mL

## 2023-09-27 LAB — CK: Total CK: 2986 U/L — ABNORMAL HIGH (ref 49–397)

## 2023-09-27 MED ORDER — DICLOFENAC SODIUM 1 % EX GEL
2.0000 g | Freq: Four times a day (QID) | CUTANEOUS | Status: AC
  Administered 2023-09-27 – 2023-09-29 (×5): 2 g via TOPICAL
  Filled 2023-09-27 (×2): qty 100

## 2023-09-27 MED ORDER — ACETAMINOPHEN 325 MG PO TABS
650.0000 mg | ORAL_TABLET | Freq: Four times a day (QID) | ORAL | Status: DC | PRN
  Administered 2023-09-28 – 2023-09-29 (×4): 650 mg via ORAL
  Filled 2023-09-27 (×4): qty 2

## 2023-09-27 MED ORDER — SODIUM CHLORIDE 0.9 % IV SOLN: INTRAVENOUS | Status: DC

## 2023-09-27 MED ORDER — SODIUM CHLORIDE 0.9 % IV BOLUS
500.0000 mL | Freq: Once | INTRAVENOUS | Status: AC
  Administered 2023-09-27: 500 mL via INTRAVENOUS

## 2023-09-27 MED ORDER — BISACODYL 5 MG PO TBEC: 5.0000 mg | DELAYED_RELEASE_TABLET | Freq: Every day | ORAL | Status: DC | PRN

## 2023-09-27 MED ORDER — POLYETHYLENE GLYCOL 3350 17 G PO PACK
17.0000 g | PACK | Freq: Every day | ORAL | Status: DC
  Administered 2023-09-27 – 2023-09-30 (×3): 17 g via ORAL
  Filled 2023-09-27 (×5): qty 1

## 2023-09-27 MED ORDER — ENOXAPARIN SODIUM 40 MG/0.4ML IJ SOSY
40.0000 mg | PREFILLED_SYRINGE | INTRAMUSCULAR | Status: DC
  Administered 2023-09-27 – 2023-09-30 (×4): 40 mg via SUBCUTANEOUS
  Filled 2023-09-27 (×3): qty 0.4

## 2023-09-27 MED ORDER — ACETAMINOPHEN 650 MG RE SUPP: 650.0000 mg | Freq: Four times a day (QID) | RECTAL | Status: DC | PRN

## 2023-09-27 NOTE — Transitions of Care (Post Inpatient/ED Visit) (Signed)
09/27/2023  Name: Michael [name] MRN: [id number] DOB: 1941/08/18  Today's TOC FU Call Status: Today's TOC FU Call Status:: Successful TOC FU Call Completed TOC FU Call Complete Date: 09/27/23 Patient's Name and Date of Birth confirmed.  Transition Care Management Follow-up Telephone Call Date of Discharge: 09/25/23 [facility] Type of Discharge: Inpatient Admission Primary Inpatient Discharge Diagnosis:: Acute metabolic encephalopathy How have you been since you were released from the hospital?: Worse (per spouse, he slid OOB x 2 ove weekemd. Sons were able ro get him back in bed x 1 EMS was called x 1. Spouse stated she is unable to " manage: He has not eaten and she is unable to assist him to the bathroom. She did manage to get a pull up in him.) Any questions or concerns?: Yes Patient Questions/Concerns:: considering SNF placememt Patient Questions/Concerns Addressed: Notified Provider of Patient Questions/Concerns (Call to Dr Ouida Sills, Call to [facility] and [facility] and [place])  Items Reviewed: Did you receive and understand the discharge instructions provided?: Yes Medications obtained,verified, and reconciled?: No Medications Not Reviewed Reasons:: Notified Provider of Medication Management Concern (He is not eating or drinking, increased  somnolence) Any new allergies since your discharge?: No Dietary orders reviewed?: Yes Type of Diet Ordered:: Reg Heart Healthy, - poor to scant  intake at present Do you have support at home?: Yes People in Home: spouse, child(ren), adult Name of Support/Comfort Primary Source: Spouse Dois Davenport, Sons  Medications Reviewed Today: Medications Reviewed Today   Medications were not reviewed in this encounter     Home Care and Equipment/Supplies: Were Home Health Services Ordered?: Yes Name of Home Health Agency:: [phone] Has Agency set up a time to come to your home?: No EMR reviewed for Home Health  Orders: Orders present/patient has not received call (refer to CM for follow-up) (She called [facility] this am , no response as of our call) Any new equipment or medical supplies ordered?: No  Functional Questionnaire: Do you need assistance with bathing/showering or dressing?: Yes Do you need assistance with meal preparation?: Yes Do you need assistance with eating?: Yes Do you have difficulty maintaining continence: Yes (spouse got pull up on him with difficulty) Do you need assistance with getting out of bed/getting out of a chair/moving?: Yes (he is unable to get OOB without 2 person assist) Do you have difficulty managing or taking your medications?: Yes (spouse manages , difficulty due to mental status and poor PO)  Follow up appointments reviewed: PCP Follow-up appointment confirmed?: No (Call was placed rp PCP by spouse and this  CM,) MD Provider Line [phone] Given: No Specialist Hospital Follow-up appointment confirmed?: NA Do you need transportation to your follow-up appointment?: Yes (he is unable to drive would need transfer and transport assit) Transportation Need Intervention Addressed By:: AMB Referral For SDOH Needs (Assist with SNF placement) Do you understand care options if your condition(s) worsen?: Yes-patient verbalized understanding  SDOH Interventions Today    Flowsheet Row Most Recent Value  SDOH Interventions   Food Insecurity Interventions Intervention Not Indicated  Housing Interventions Intervention Not Indicated  Transportation Interventions AMB Referral  Utilities Interventions Intervention Not Indicated       Goals Addressed             This Visit's Progress    TOC Care Plan       Current Barriers:  Care Coordination needs related to Level of care concerns, ADL IADL limitations, Cognitive Deficits, Inability to perform  ADL's independently, and Inability to perform IADL's independently Chronic Disease Management support and education  needs related to Dementia and _ COVID-19  Lacks caregiver support Cognitive Deficits  RNCM Clinical Goal(s):  Patient will take all medications exactly as prescribed and will call provider for medication related questions as evidenced by no missed medication doses  continue to work with RN Care Manager to address care management and care coordination needs related to  Dementia and _ COVID-19 as evidenced by adherence to CM Team Scheduled appointments work with Child psychotherapist to address  related to the management of Level of care concerns, ADL IADL limitations, Cognitive Deficits, Inability to perform ADL's independently, and Inability to perform IADL's independently related to the management of Dementia and COVID-19, increase independence  as evidenced by review of EMR and patient or Child psychotherapist report through collaboration with Medical illustrator, provider, and care team.   Interventions: Evaluation of current treatment plan related to  self management and patient's adherence to plan as established by provider  Transitions of Care:  New goal. Community Resource Referral Made to address None Doctor Visits  - discussed the importance of doctor visits Communication with PCP, Frances Furbish and SNF re: possible placement Level of Care needs assessed with patient/caregiver, options discussed with patient/caregiver, orders reviewed, and education provided [facility] provider for patient needs possible SNF Placement Contacted Health RN/OT/PT Frances Furbish [facility] added Nursing [phone])  COVID Interventions:  (Status:  New goal.) Short Term Goal Provided education to patient to enhance basic understanding of COVID-19 as a viral disease, measures to prevent exposure, signs and symptoms, recommended vaccine schedule, when to contact provider Discussed effects, symptoms, and management of "long COVID"' Assessed social determinant of health barriers  Patient Goals/Self-Care Activities: Participate in Transition of  Care Program/Attend TOC scheduled calls Take all medications as prescribed Call pharmacy for medication refills 3-7 days in advance of running out of medications Perform all self care activities independently  Perform IADL's (shopping, preparing meals, housekeeping, managing finances) independently Work with the Child psychotherapist to address care coordination needs and will continue to work with the clinical team to address health care and disease management related needs  Follow Up Plan:  Telephone follow up appointment with care management team member scheduled for:  pending-dependent on outcome of  possible placement  The patient has been provided with contact information for the care management team and has been advised to call with any health related questions or concerns.          Patient is at high risk for readmission and/or has history of  high utilization  Discussed VBCI  TOC program and weekly calls to patient to assess condition/status, medication management  and provide support/education as indicated . Patient/ Caregiver voiced understanding and is  agreeable to 30 day program    Routine follow-up and on-going assessment evaluation and education of disease processes, recommended interventions for both chronic and acute medical conditions , will occur during each weekly call along with ongoing review of symptoms ,medication reviews and reconciliation. If indicated ,any updates , inconsistencies, discrepancies or acute care concerns will be addressed and routed to the correct Practitioner .  Based on current information and Insurance plan -Reviewed benefits accessible to patient, including details about eligibility options for care and  available value based care options  if any areas of needs were identified.  Reviewed patients/ caregiver  ability to access and / or  ability with navigating the benefits system..Amb Referral made for assistance with  SNF Placement , refer to orders section of  note for details  Call placed to [facility] requested Nursing be added to [facility] referral and a visit be scheduled within 24 hrs. ( Current referral was for PT only)  Please refer to Care Plan for goals and interventions -Effectiveness of interventions, symptom management and outcomes will be evaluated  weekly during [facility] 30-day Program Outreach calls  . Any necessary  changes and updates to Care Plan will be completed episodically - working on SNF Placement  Call paced to [facility] - denied by AdmissionsLyla Son) LM with [facility]- awaiting call back Lm with  [facility] -awaiting call back  Reviewed goals for care Patient / Caregiver verbalizes understanding of instructions and care plan provided. Patient was encouraged to make informed decisions about their care, actively participate in managing their health condition, and implement lifestyle changes as needed to promote independence and self-management of health care  Per spouse patient has increased needs beyond her capability Call placed to Dr Alonza Smoker office spoke with Budd Palmer reviewed current situation, and made her aware spouse feels a SNF placement would be best, they are able to completed necessary paperwork if placement is able to be made. Spouse LM with PCP office this am regarding status and need for appointment and / or assistance in this matter.    The patient has been provided with contact information for the care management team and has been advised to call with any health-related questions or concerns.   Spouse updated on above   Susa Loffler , Scientist, research (physical sciences), RN Care Management Coordinator [place]   [facility] [email or web address] Direct Dial: [phone]

## 2023-09-27 NOTE — ED Triage Notes (Signed)
Pt's family called ems due to the pt not eating, drinking and being very weak.

## 2023-09-27 NOTE — ED Notes (Signed)
Patient's family reported the patient had removed the IV catheter several times during the night during his last stay in the hospital.

## 2023-09-27 NOTE — Progress Notes (Signed)
  Care Coordination   Note   09/27/2023 Name: ROD VENTURINO MRN: [id number] DOB: [birth year]  ALEKSANDER EAST is a 82 y.o. year old male who sees Carylon Perches, MD for primary care. I reached out to Allie Dimmer by phone today to offer care coordination services.  Mr. Zenk was given information about Care Coordination services today including:   The Care Coordination services include support from the care team which includes your Nurse Coordinator, Clinical Social Worker, or Pharmacist.  The Care Coordination team is here to help remove barriers to the health concerns and goals most important to you. Care Coordination services are voluntary, and the patient may decline or stop services at any time by request to their care team member.   Care Coordination Consent Status: Patient agreed to services and verbal consent obtained.   Follow up plan:  Telephone appointment with care coordination team member scheduled for:  09/27/23  Encounter Outcome:  Patient Scheduled  [facility] Coordination Care Guide  Direct Dial: [phone]

## 2023-09-27 NOTE — Patient Outreach (Signed)
  Care Management  Transitions of Care Program Transitions of Care Post-discharge Care Coordination   09/27/2023 Name: Michael [name] MRN: [id number] DOB: [birth year]  Subjective: Michael [name] is a 82 y.o. year old male who is a primary care patient of Carylon Perches, MD. The Care Management team Engaged with Deb @ [facility] regarding possible placement in SNF. She reviewed EPIC information, and requests an FL-2 and a PCP note regarding need for placement . Fax number for referral information [phone], however she stated she can pick up documents from PCP when they are available . She is also beginning request for authorization with [employer]   Call placed and spoke with [facility] @ Dr Ouida Sills office , they will complete FL-2 and Referral letter,  Patient spouse  will be updated   Pan: The patient has been provided with contact information for the care management team and has been advised to call with any health related questions or concerns.   Susa Loffler , BSN, RN Care Management Coordinator [place]   [facility] [email or web address] Direct Dial: [phone]

## 2023-09-27 NOTE — Patient Instructions (Signed)
Visit Information  Thank you for taking time to visit with me today. Please don't hesitate to contact me if I can be of assistance to you.   Following are the goals we discussed today:   Goals Addressed             This Visit's Progress    Receive Assistance with Higher Level of Care Placement.   On track    Care Coordination Interventions:  Interventions Today    Flowsheet Row Most Recent Value  Chronic Disease   Chronic disease during today's visit Other, Chronic Kidney Disease/End Stage Renal Disease (ESRD)  [Major Neurocognitive Disorder, Dementia, Acute Metabolic Encephalopathy, Frequent Falls, Recent Fall w/ Injury, Unsteady Balance/Gait, Inability to Perform Activities of Daily Living Independently, Caregiver Fatigue, Higher Level of Care Placement Needed]  General Interventions   General Interventions Discussed/Reviewed General Interventions Discussed, Labs, Vaccines, Doctor Visits, Health Screening, Annual Foot Exam, General Interventions Reviewed, Communication with, [name], Level of Care, Horticulturist, commercial (DME), Annual Eye Exam  [Encouraged Routine Engagement & Communication with Care Team Members.]  Labs Hgb A1c every 3 months, Kidney Function  [Encouraged Routine Labs.]  Vaccines COVID-19, Flu, Pneumonia, RSV, Shingles, Tetanus/Pertussis/Diphtheria  [Encouraged Annual Vaccinations.]  Doctor Visits Discussed/Reviewed Doctor Visits Discussed, Specialist, Doctor Visits Reviewed, Annual Wellness Visits, PCP  [Encouraged Routine Engagement & Communication with Care Team Members.]  Health Screening Bone Density, Colonoscopy, Prostate  [Encouraged Annual Health Screenings.]  Durable Medical Equipment (DME) BP Cuff, Other, Ephraim Hamburger, Prescription Eyeglasses.]  PCP/Specialist Visits Compliance with follow-up visit  [Encouraged Routine Engagement & Communication with Care Team Members.]  Communication with PCP/Specialists, RN, Pharmacists, Social Work   [employer] Routine Engagement & Communication with Care Team Members.]  Level of Care Adult Daycare, Air traffic controller, Assisted Living, Skilled Nursing Facility  [Confirmed Disinterest in Enrollment in Adult Day Care Program. Confirmed Interest in Pursusing Higher Level of Care Placement Options (I.e Skilled Nursing for Short-Term Rehabilitative Services Versus Long-Term Memory Care Assisted Living).]  Applications Medicaid, Personal Care Services  [Confirmed Disinterest in Applying for Medicaid or Special Assistance Long-Term Care Medicaid Due to Combined Income Exceeding Poverty Guidelines for State of [place] for Fiscal Year 2024/2025. Confirmed Ineligibility to Apply for Personal Care Services.]  Exercise Interventions   Exercise Discussed/Reviewed Exercise Discussed, Assistive device use and maintanence, Exercise Reviewed, Physical Activity, Weight Managment  [Patient Exercised 7 Days a Week for 30 Minutes Per Day Walking Dog with Wife Prior to Recent Fall.]  Physical Activity Discussed/Reviewed Physical Activity Discussed, Home Exercise Program (HEP), Physical Activity Reviewed, PREP, Gym, Types of exercise  [Confirmed with Wife Patient's Inability to Ambulate or Perform Activities of Daily Living Independently Since Recent Fall.]  Weight Management Weight maintenance  [Encouraged Supplemental Nutrition & Hydration.]  Education Interventions   Education Provided Provided Therapist, sports, Provided Web-based Education, Provided Education  [Encouraged Review, Consideration & Implementation.]  Provided Verbal Education On Nutrition, Foot Care, Eye Care, Labs, Mental Health/Coping with Illness, When to see the doctor, [name], [employer], Medication, Exercise, Blood Sugar Monitoring, Applications  [Encouraged Heart-Healthy, Low Fat, Low Sodium, Supplemental Diet.]  Labs Reviewed Hgb A1c  Applications Medicaid, Personal Care Services  [Confirmed Disinterest in Applying for Medicaid or Special  Assistance Long-Term Care Medicaid Due to Combined Income Exceeding Poverty Guidelines for [place] for Fiscal Year 2024/2025. Confirmed Ineligibility to Apply for Personal Care Services.]  Mental Health Interventions   Mental Health Discussed/Reviewed Mental Health Discussed, Anxiety, Depression, Grief and Loss, Mental Health Reviewed, Substance Abuse,  Coping Strategies, Crisis, Suicide, Other  [Assessed Mental Health & Cognitive Status of Patient & Caregiver, Wife.]  Nutrition Interventions   Nutrition Discussed/Reviewed Nutrition Discussed, Adding fruits and vegetables, Increasing proteins, Nutrition Reviewed, Decreasing fats, Decreasing salt, Fluid intake, Carbohydrate meal planning, Portion sizes, Decreasing sugar intake, Supplemental nutrition  [Confirmed Patient's Lack of Desire to Eat, Drink or Consume Prescription Medications.]  Pharmacy Interventions   Pharmacy Dicussed/Reviewed Pharmacy Topics Discussed, Medications and their functions, Pharmacy Topics Reviewed, Medication Adherence, Affording Medications  [Confirmed Ability to Afford Prescription Medications, But Current Non-Compliance.]  Medication Adherence Not taking medication  [Confirmed with Wife Patient's Desire to Stop Taking Prescription Medications.]  Safety Interventions   Safety Discussed/Reviewed Safety Discussed, Safety Reviewed, Fall Risk, Home Safety  [Encouraged Routine Use of Home Safety Devices & Durable Medical Equipment. Encouraged Consideration of Home Safety Evaluation & Home Health Physical Therapy Services, as Recommended.]  Home Safety Assistive Devices, Need for home safety assessment, Refer for home visit, Refer for community resources, Contact provider for referral to PT/OT  [Agreed to Contact Primary Care Provider to Request Order for Home Health Physical Therapy Services & Referral to [facility] Agency of Choice. Agreed to Contact Primary Care Provider to Request Review & Signature of FL-2 Form for Placement  Purposes.]  Advanced Directive Interventions   Advanced Directives Discussed/Reviewed Advanced Directives Discussed, Advanced Directives Reviewed, Advanced Care Planning, End of Life  [Confirmed Initiation of Advanced Directives (Living Will & Healthcare Power of Attorney Documents) & Encouraged to Provide Hospital Staff or Primary Care Providers with Copies to Scan into Electronic Medical Record in Epic.]  End of Life Hospice, Palliative  [Encouraged Consideration of End-of-Life Care & Palliative Care Consult through [facility].]       Assessed Social Determinant of Health Barriers. Discussed Plans for Ongoing Care Management Follow Up. Provided Careers information officer Information for Care Management Team Members. Screened for Signs & Symptoms of Depression, Related to Chronic Disease State. PHQ2 & PHQ9 Depression Screen Completed & Results Reviewed. Suicidal Ideation & Homicidal Ideation Assessed - None Present.   Domestic Violence Assessed - None Present. Access to Weapons Assessed - None Present.   Active Listening & Reflection Utilized. Verbalization of Feelings Encouraged. Emotional Support Provided. Feelings of Caregiver Burnout Validated. Caregiver Stress Acknowledged. Caregiver Resources Reviewed. Caregiver Support Groups Mailed. Self-Enrollment in Caregiver Support Group of Interest Emphasized. Crisis Support Information, Agencies, Services & Resources Discussed. Problem Solving Interventions Identified. Task-Centered Solutions Implemented.   Solution-Focused Strategies Developed. Acceptance & Commitment Therapy Introduced. Brief Cognitive Behavioral Therapy Initiated. Client-Centered Therapy Enacted. Reviewed Prescription Medications & Discussed Importance of Compliance. Quality of Sleep Assessed & Sleep Hygiene Techniques Promoted. CSW Collaboration with Wife, Carrie Readus to Confirm Her Decision to Contact Emergency Medical Services to Have Patient Transported to  [facility] Emergency Department at [facility] Due to Patient's Current Medical Condition & Mental State. Encouraged Routine Engagement with Danford Bad, Licensed Clinical Social Worker with [facility] [phone]), if You Have Questions, Need Assistance, or If Additional Social Work Needs Are Identified Between Now & Our Next Follow-Up Outreach Call, Scheduled on 10/04/2023 at 3:45 PM.      Our next appointment is by telephone on 10/04/2023 at 3:45 pm.  Please call the care guide team at [phone] if you need to cancel or reschedule your appointment.   If you are experiencing a Mental Health or Behavioral Health Crisis or need someone to talk to, please call the Suicide and Crisis Lifeline: 988 call the [place]  National Suicide Prevention Lifeline: 973-884-6711 or TTY: 279-867-4488 TTY 3255543410) to talk to a trained counselor call 1-800-273-TALK (toll free, 24 hour hotline) go to [facility] [address] [place] [phone]) call the [facility] Crisis Line: 559-013-2767 call 911  Patient verbalizes understanding of instructions and care plan provided today and agrees to view in MyChart. Active MyChart status and patient understanding of how to access instructions and care plan via MyChart confirmed with patient.     Telephone follow up appointment with care management team member scheduled for:  10/04/2023 at 3:45 pm.  Danford Bad, BSW, MSW, LCSW  Embedded Practice Social Work Case Manager  [facility], Population Health Direct Dial: [phone]  Fax: [phone] Email: [email or web address]: [email or web address]

## 2023-09-27 NOTE — H&P (Signed)
History and Physical  NIMAI BYARS ZOX:[id number] DOB: August 25, 1941 DOA: 09/27/2023 PCP: Carylon Perches, MD  Chief Complaint: FTT Historian: patient  HPI:  Michael [name] is a 82 y.o. 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 At baseline, they live at home with her wife and have been completely dependent for their ADLs since their recent discharge on 12/14.  However, prior to that hospitalization, he was independent and ambulated without any support and was independent for ADLs.  They presented from home to the ED on 09/27/2023 with FTT x 2 days.  EMS had been called yesterday to the home and family declined to transport him in at that time.  They decided that they would have him transported in today because he has not had more than a cupful of Gatorade since being discharged and has on been unable to stand.  He has also been unable to converse with his family today. He took his home medications yesterday but has not been able to take any today. Family endorses that he has continued to have mild productive cough since his discharge 2 days ago. Patient complains only of lower back pain which is chronic and he has not taken any pain medication for it.  In the ED, it was found that they had fever of 100.9, heart rate in the 60s to 90s, respiratory rate of 21, blood pressure 146/88, 2 L nasal cannula oxygen requirement to maintain saturations in high 90%..  Significant findings included: Stable and unremarkable CBC.  Na+ 145, K+ 3.5, glucose 105, creatinine 1.39, albumin 3.0, AST 95, GFR 50.  His BMP was unremarkable on discharge 2 days ago.  CK is almost 3000 and had been in 500s when he discharged.  LA 1.5.  Procalcitonin 2.64, had been -2 days ago. Blood cultures pending Urinalysis not strongly positive for infection  They were initially treated with IV fluids.   Patient was admitted  to medicine service for further workup and management of dehydration as outlined in detail below.  Assessment/Plan Principal Problem:   Rhabdomyolysis Active Problems:   Hypoxia   COVID   Acute metabolic encephalopathy-Per report from EDP, patient was not verbally responsive on their exam however, he is alert and oriented and able to follow commands by the time I examined him. Dehydration-secondary to poor p.o. intake, potentially residual symptoms from known  COVID infection-requiring 2 L nasal cannula currently AKI- Cr 1.39.  Was 1.14 at discharge 2 days ago - supportive care, mucinex, sudafed, cough medicine PRN - continue IVF - BMP am - PT/OT - TOC consulted for suspected SNF placement. -Follow blood culture results and consider secondary source of infection but none seen on chest x-ray, skin, urine and denies any abdominal pain, nausea, diarrhea  Nontraumatic rhabdomyolysis-again secondary to dehydration but was also taking statin medication which can contribute.  Due to this source and also his age I would recommend permanent discontinuation of statin medications.  Patient has resolving ecchymosis on right face which is from the fall preceding his prior admission and family denies any new injuries since his discharge. -Continue IV fluids -Monitor CK  Chronic back pain  stable lumbar fusions -Voltaren gel -Tylenol -Heating pads -PT/OT -Avoid sedating medications  CAD  HLD-stable, asymptomatic  Dementia-patient not currently at baseline per family report. 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 His smoking use included cigarettes. He started smoking about 73 years ago. He has a 60 pack-year smoking history. He has been exposed to tobacco smoke. He has never used smokeless tobacco. He reports that he does not drink alcohol and does not use drugs.  No Known Allergies  Family History  Problem Relation Age of Onset   Heart disease Unknown    Asthma Unknown     Prior to Admission medications   Medication Sig Start Date End Date Taking? Authorizing Provider  albuterol (VENTOLIN HFA) 108 (90 Base) MCG/ACT inhaler Inhale into the lungs every 6 (six) hours as needed for wheezing or shortness of breath.    [provider]  donepezil (ARICEPT) 10 MG tablet Take 10 mg by mouth at bedtime.    [provider]  levocetirizine (XYZAL) 5 MG tablet Take 5 mg by mouth every evening.    [provider]  memantine (NAMENDA) 10 MG tablet Take 1 tablet (10 mg total) by mouth 2 (two) times daily. 01/18/23   Glean Salvo, NP  rosuvastatin (CRESTOR) 10 MG tablet Take 10 mg by mouth daily.      [provider]  vitamin B-12 (VITAMIN B12) 500 MCG tablet Take 1 tablet (500 mcg total) by mouth daily. 09/25/23   Catarina Hartshorn, MD   I have personally, briefly reviewed patient's prior medical records in [facility]  Objective: Blood pressure (!) 172/72, pulse 71, temperature (!) 100.9 F (38.3 C), temperature source Oral, resp. rate (!) 21, height 5\' 8"  (1.727 m), weight  64 kg, SpO2 96%.   Constitutional: NAD, calm, comfortable HEENT: Healing ecchymosis and superficial lacerations on right face and bridge of nose Neck: normal, supple, no masses, no thyromegaly Respiratory: CTAB, no wheezing, no crackles. Normal respiratory effort. No accessory muscle use.  Cardiovascular: RRR, no murmurs / rubs / gallops. No extremity edema. 2+ pedal pulses. no clubbing / cyanosis.  Abdomen: soft, NT, ND, no masses or HSM palpated. Musculoskeletal: No joint deformity upper and lower extremities. Normal muscle tone.  Skin: dry, intact, normal color, normal temperature on exposed skin Neurologic: Alert and oriented x 2. Normal speech. Grossly non-focal exam. PERRL-rests with his eyes closed but is following the conversation throughout the encounter  Labs on Admission: I have personally reviewed admission labs and imaging studies  CBC    Component Value Date/Time   WBC 9.5 09/27/2023 1603   RBC 4.49 09/27/2023 1603   HGB 12.8 (L) 09/27/2023 1603   HCT 39.9 09/27/2023 1603   PLT 193 09/27/2023 1603   MCV 88.9 09/27/2023 1603   MCH 28.5  09/27/2023 1603   MCHC 32.1 09/27/2023 1603   RDW 13.7 09/27/2023 1603   LYMPHSABS 0.4 (L) 09/27/2023 1603   MONOABS 0.5 09/27/2023 1603   EOSABS 0.0 09/27/2023 1603   BASOSABS 0.0 09/27/2023 1603   CMP     Component Value Date/Time   NA 145 09/27/2023 1603   K 3.5 09/27/2023 1603   CL 107 09/27/2023 1603   CO2 26 09/27/2023 1603   GLUCOSE 105 (H) 09/27/2023 1603   BUN 39 (H) 09/27/2023 1603   CREATININE 1.39 (H) 09/27/2023 1603   CALCIUM 9.4 09/27/2023 1603   PROT 6.2 (L) 09/27/2023 1603   ALBUMIN 3.0 (L) 09/27/2023 1603   AST 95 (H) 09/27/2023 1603   ALT 24 09/27/2023 1603   ALKPHOS 46 09/27/2023 1603   BILITOT 1.0 09/27/2023 1603   GFRNONAA 50 (L) 09/27/2023 1603   GFRAA 48 (L) 07/09/2017 1915    Radiological Exams on Admission: DG Chest Port 1 View Result Date: 09/27/2023 CLINICAL DATA:  Failure to thrive. EXAM:  PORTABLE CHEST 1 VIEW COMPARISON:  09/25/2023 FINDINGS: The cardiac silhouette, mediastinal and hilar contours are normal in stable. Stable mild tortuosity and calcification of the thoracic aorta. No acute pulmonary findings. No pulmonary lesions, pleural effusion or pneumothorax. The bony thorax is intact.  Stable total right shoulder hardware. IMPRESSION: No acute cardiopulmonary findings. Electronically Signed   By: Rudie Meyer M.D.   On: 09/27/2023 17:11    EKG: Independently reviewed.  NSR  DVT prophylaxis: enoxaparin (LOVENOX) injection 40 mg Start: 09/27/23 2200  Code Status: DNR, discussed this with his wife who states that they both wish to be DNR and have discussed this but do not have documentation for this and are interested in starting the paperwork Family Communication: Wife and son at bedside Disposition Plan: Admit to inpatient Consults called: none  Leeroy Bock, DO [facility]  09/27/2023, 8:28 PM    To contact the appropriate [facility] Attending or Consulting provider: Check [email or web address] for coverage from 7pm-7am

## 2023-09-27 NOTE — ED Notes (Signed)
ED TO INPATIENT HANDOFF REPORT  ED Nurse Name and Phone #: Ephriam Knuckles Medic   S Name/Age/Gender Michael [name] 82 y.o. male Room/Bed: APA02/APA02  Code Status   Code Status: Do not attempt resuscitation (DNR) PRE-ARREST INTERVENTIONS DESIRED  Home/SNF/Other Home Patient oriented to: self, place, time, and situation Is this baseline? Yes   Triage Complete: Triage complete  Chief Complaint Rhabdomyolysis [M62.82]  Triage Note Pt's family called ems due to the pt not eating, drinking and being very weak.    Allergies No Known Allergies  Level of Care/Admitting Diagnosis ED Disposition     ED Disposition  Admit   Condition  --   Comment  Hospital Area: [facility] [[id number]]  Level of Care: Med-Surg [16]  Covid Evaluation: Confirmed COVID Positive  Diagnosis: Rhabdomyolysis [728.88.ICD-9-CM]  Admitting Physician: Leeroy Bock [[id number]]  Attending Physician: Leeroy Bock [[id number]]  Certification:: I certify this patient will need inpatient services for at least 2 midnights  Expected Medical Readiness: 10/01/2023          B Medical/Surgery History Past Medical History:  Diagnosis Date   Coronary artery disease    does not see a cardiologist, reports no cardiac symptoms   Hyperlipidemia    Hypertension    Seasonal allergies    Past Surgical History:  Procedure Laterality Date   BACK SURGERY     fusion,rods and cage   CATARACT EXTRACTION W/ INTRAOCULAR LENS IMPLANT Bilateral    per wife   COLONOSCOPY  07/31/2011   Procedure: COLONOSCOPY;  Surgeon: Dalia Heading;  [facility];  Service: Gastroenterology;  Laterality: N/A;   RETINAL DETACHMENT SURGERY Left    wife believes it was in the left eye   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   Procedure: RIGHT REVERSE SHOULDER ARTHROPLASTY;  Surgeon: Jones Broom, MD;  Location: [facility] OR;  Service: Orthopedics;   Laterality: Right;  Right reverse total shoulder     A IV Location/Drains/Wounds Patient Lines/Drains/Airways Status     Active Line/Drains/Airways     Name Placement date Placement time Site Days   Peripheral IV 09/27/23 20 G 1" Anterior;Distal;Left;Upper Arm 09/27/23  1630  Arm  less than 1            Intake/Output Last 24 hours  Intake/Output Summary (Last 24 hours) at 09/27/2023 2032 Last data filed at 09/27/2023 1936 Gross per 24 hour  Intake 999.5 ml  Output --  Net 999.5 ml    Labs/Imaging Results for orders placed or performed during the hospital encounter of 09/27/23 (from the past 48 hours)  CBC with Differential/Platelet     Status: Abnormal   Collection Time: 09/27/23  4:03 PM  Result Value Ref Range   WBC 9.5 4.0 - 10.5 K/uL   RBC 4.49 4.22 - 5.81 MIL/uL   Hemoglobin 12.8 (L) 13.0 - 17.0 g/dL   HCT 40.1 02.7 - 25.3 %   MCV 88.9 80.0 - 100.0 fL   MCH 28.5 26.0 - 34.0 pg   MCHC 32.1 30.0 - 36.0 g/dL   RDW 66.4 40.3 - 47.4 %   Platelets 193 150 - 400 K/uL   nRBC 0.0 0.0 - 0.2 %   Neutrophils Relative % 90 %   Neutro Abs 8.6 (H) 1.7 - 7.7 K/uL   Lymphocytes Relative 4 %   Lymphs Abs 0.4 (L) 0.7 - 4.0 K/uL   Monocytes Relative 5 %   Monocytes Absolute 0.5 0.1 -  1.0 K/uL   Eosinophils Relative 0 %   Eosinophils Absolute 0.0 0.0 - 0.5 K/uL   Basophils Relative 0 %   Basophils Absolute 0.0 0.0 - 0.1 K/uL   Immature Granulocytes 1 %   Abs Immature Granulocytes 0.06 0.00 - 0.07 K/uL    Comment: Performed at [facility], [address] [place], [place] [address]  Comprehensive metabolic panel     Status: Abnormal   Collection Time: 09/27/23  4:03 PM  Result Value Ref Range   Sodium 145 135 - 145 mmol/L    Comment: DELTA CHECK NOTED   Potassium 3.5 3.5 - 5.1 mmol/L   Chloride 107 98 - 111 mmol/L   CO2 26 22 - 32 mmol/L   Glucose, Bld 105 (H) 70 - 99 mg/dL    Comment: Glucose reference range applies only to samples taken after fasting for at least 8  hours.   BUN 39 (H) 8 - 23 mg/dL   Creatinine, Ser 2.95 (H) 0.61 - 1.24 mg/dL   Calcium 9.4 8.9 - 62.1 mg/dL   Total Protein 6.2 (L) 6.5 - 8.1 g/dL   Albumin 3.0 (L) 3.5 - 5.0 g/dL   AST 95 (H) 15 - 41 U/L   ALT 24 0 - 44 U/L   Alkaline Phosphatase 46 38 - 126 U/L   Total Bilirubin 1.0 <1.2 mg/dL   GFR, Estimated 50 (L) >60 mL/min    Comment: (NOTE) Calculated using the CKD-EPI Creatinine Equation (2021)    Anion gap 12 5 - 15    Comment: Performed at [facility], [address], [place], [place] [address]  Lipase, blood     Status: None   Collection Time: 09/27/23  4:03 PM  Result Value Ref Range   Lipase 32 11 - 51 U/L    Comment: Performed at [facility], [address] [place], [place] [address]  CK     Status: Abnormal   Collection Time: 09/27/23  4:03 PM  Result Value Ref Range   Total CK 2,986 (H) 49 - 397 U/L    Comment: Performed at [facility], [address] [place], [place] [address]  Lactic acid, plasma     Status: None   Collection Time: 09/27/23  4:03 PM  Result Value Ref Range   Lactic Acid, Venous 1.5 0.5 - 1.9 mmol/L    Comment: Performed at [facility], [address], [name], [place] [address]  Procalcitonin     Status: None   Collection Time: 09/27/23  4:03 PM  Result Value Ref Range   Procalcitonin 2.64 ng/mL    Comment:        Interpretation: PCT > 2 ng/mL: Systemic infection (sepsis) is likely, unless other causes are known. (NOTE)       Sepsis PCT Algorithm           Lower Respiratory Tract                                      Infection PCT Algorithm    ----------------------------     ----------------------------         PCT < 0.25 ng/mL                PCT < 0.10 ng/mL          Strongly encourage             Strongly discourage   discontinuation  of antibiotics    initiation of antibiotics    ----------------------------     -----------------------------       PCT 0.25 - 0.50 ng/mL            PCT 0.10 - 0.25 ng/mL               OR        >80% decrease in PCT            Discourage initiation of                                            antibiotics      Encourage discontinuation           of antibiotics    ----------------------------     -----------------------------         PCT >= 0.50 ng/mL              PCT 0.26 - 0.50 ng/mL               AND       <80% decrease in PCT              Encourage initiation of                                             antibiotics       Encourage continuation           of antibiotics    ----------------------------     -----------------------------        PCT >= 0.50 ng/mL                  PCT > 0.50 ng/mL               AND         increase in PCT                  Strongly encourage                                      initiation of antibiotics    Strongly encourage escalation           of antibiotics                                     -----------------------------                                           PCT <= 0.25 ng/mL                                                 OR                                        >  80% decrease in PCT                                      Discontinue / Do not initiate                                             antibiotics  Performed at [facility], [address], [place], [place] [address]   Culture, blood (Routine X 2) w Reflex to ID Panel     Status: None (Preliminary result)   Collection Time: 09/27/23  4:05 PM   Specimen: Vein; Blood  Result Value Ref Range   Specimen Description      RIGHT ANTECUBITAL BOTTLES DRAWN AEROBIC AND ANAEROBIC   Special Requests      Blood Culture adequate volume Performed at [facility], [address], [place], [place] [address]    Culture PENDING    Report Status PENDING   Urinalysis, Routine w reflex microscopic -Urine, Clean Catch     Status: Abnormal   Collection Time: 09/27/23  4:29 PM  Result Value Ref Range   Color, Urine YELLOW YELLOW   APPearance CLEAR CLEAR   Specific Gravity, Urine 1.016 1.005 -  1.030   pH 5.0 5.0 - 8.0   Glucose, UA NEGATIVE NEGATIVE mg/dL   Hgb urine dipstick MODERATE (A) NEGATIVE   Bilirubin Urine NEGATIVE NEGATIVE   Ketones, ur NEGATIVE NEGATIVE mg/dL   Protein, ur 30 (A) NEGATIVE mg/dL   Nitrite NEGATIVE NEGATIVE   Leukocytes,Ua NEGATIVE NEGATIVE   RBC / HPF 0-5 0 - 5 RBC/hpf   WBC, UA 0-5 0 - 5 WBC/hpf   Bacteria, UA FEW (A) NONE SEEN   Squamous Epithelial / HPF 0-5 0 - 5 /HPF   Mucus PRESENT     Comment: Performed at [facility], [address], [place], [place] [address]   DG Chest Port 1 View Result Date: 09/27/2023 CLINICAL DATA:  Failure to thrive. EXAM: PORTABLE CHEST 1 VIEW COMPARISON:  09/25/2023 FINDINGS: The cardiac silhouette, mediastinal and hilar contours are normal in stable. Stable mild tortuosity and calcification of the thoracic aorta. No acute pulmonary findings. No pulmonary lesions, pleural effusion or pneumothorax. The bony thorax is intact.  Stable total right shoulder hardware. IMPRESSION: No acute cardiopulmonary findings. Electronically Signed   By: Rudie Meyer M.D.   On: 09/27/2023 17:11    Pending Labs Unresulted Labs (From admission, onward)     Start     Ordered   10/04/23 0500  Creatinine, serum  (enoxaparin (LOVENOX)    CrCl >/= 30 ml/min)  Weekly,   R     Comments: while on enoxaparin therapy    09/27/23 1951   09/27/23 1551  Culture, blood (Routine X 2) w Reflex to ID Panel  BLOOD CULTURE X 2,   R (with STAT occurrences)      09/27/23 1550   09/27/23 1551  Lactic acid, plasma  Now then every 2 hours,   R (with STAT occurrences)      09/27/23 1550            Vitals/Pain Today's Vitals   09/27/23 1728 09/27/23 1730 09/27/23 1845 09/27/23 2000  BP: (!) 172/72 (!) 161/63 (!) 131/94 (!) 155/75  Pulse: 71 67 90 (!) 56  Resp: (!) 21  20 20 (!) 21  Temp: (!) 100.9 F (38.3 C)   99.5 F (37.5 C)  TempSrc: Oral   Oral  SpO2: 96% 91% 90% 94%  Weight:      Height:      PainSc: 4        Isolation  Precautions No active isolations  Medications Medications  0.9 %  sodium chloride infusion (0 mLs Intravenous Paused 09/27/23 2031)  acetaminophen (TYLENOL) tablet 650 mg (has no administration in time range)    Or  acetaminophen (TYLENOL) suppository 650 mg (has no administration in time range)  polyethylene glycol (MIRALAX / GLYCOLAX) packet 17 g (has no administration in time range)  bisacodyl (DULCOLAX) EC tablet 5 mg (has no administration in time range)  enoxaparin (LOVENOX) injection 40 mg (has no administration in time range)  diclofenac Sodium (VOLTAREN) 1 % topical gel 2 g (has no administration in time range)  sodium chloride 0.9 % bolus 500 mL (0 mLs Intravenous Stopped 09/27/23 1720)  sodium chloride 0.9 % bolus 500 mL (0 mLs Intravenous Stopped 09/27/23 1936)    Mobility walks     Focused Assessments    R Recommendations: See Admitting Provider Note  Report given to:   Additional Notes:

## 2023-09-27 NOTE — ED Provider Notes (Addendum)
[place] EMERGENCY DEPARTMENT AT [facility] Provider Note   CSN: [id number] Arrival date & time: 09/27/23  1537     History  Chief Complaint  Patient presents with   Failure To Thrive    Michael [name] is a 82 y.o. male.  Patient brought back in today for patient not eating drinking and being very weak.  So for failure to thrive.  Patient upon arrival had an oxygen sat 89%.  On room air temp was 100.9.  Patient started on 2 L nasal cannula with brought oxygen up to 92%.  Blood pressure good not hypotensive.  Past medical history sniffer hypertension coronary disease hyperlipidemia.  Patient was just discharged from the hospital he was admitted December 12 and discharged on December 14 for acute metabolic encephalopathy that felt due to COVID-19 infection dehydration and acute kidney injury.  Patient had MRI brain that was negative for any acute findings.  COVID test was positive.  Also had baseline dementia without any behavioral disturbances he is on Aricept and Namenda.  Patient did have an elevated CK but it was less then at thousand it was 765.  Past medical history otherwise significant for dementia hypertension hyperlipidemia coronary artery disease tobacco abuse.       Home Medications Prior to Admission medications   Medication Sig Start Date End Date Taking? Authorizing Provider  albuterol (VENTOLIN HFA) 108 (90 Base) MCG/ACT inhaler Inhale into the lungs every 6 (six) hours as needed for wheezing or shortness of breath.    [provider]  donepezil (ARICEPT) 10 MG tablet Take 10 mg by mouth at bedtime.    [provider]  levocetirizine (XYZAL) 5 MG tablet Take 5 mg by mouth every evening.    [provider]  memantine (NAMENDA) 10 MG tablet Take 1 tablet (10 mg total) by mouth 2 (two) times daily. 01/18/23   Glean Salvo, NP  rosuvastatin (CRESTOR) 10 MG tablet Take 10 mg by mouth daily.      [provider]  vitamin  B-12 (VITAMIN B12) 500 MCG tablet Take 1 tablet (500 mcg total) by mouth daily. 09/25/23   Catarina Hartshorn, MD      Allergies    Patient has no known allergies.    Review of Systems   Review of Systems  Unable to perform ROS: Dementia    Physical Exam Updated Vital Signs Pulse 82   Temp (!) 100.9 F (38.3 C) (Rectal)   Resp 17   Ht 1.727 m (5\' 8" )   Wt 64 kg   SpO2 93%   BMI 21.45 kg/m  Physical Exam Vitals and nursing note reviewed.  Constitutional:      General: He is not in acute distress.    Appearance: He is well-developed. He is ill-appearing.  HENT:     Head: Normocephalic and atraumatic.     Mouth/Throat:     Mouth: Mucous membranes are dry.  Eyes:     Conjunctiva/sclera: Conjunctivae normal.  Cardiovascular:     Rate and Rhythm: Normal rate and regular rhythm.     Heart sounds: No murmur heard. Pulmonary:     Effort: Pulmonary effort is normal. No respiratory distress.     Breath sounds: Normal breath sounds. No wheezing or rales.  Abdominal:     Palpations: Abdomen is soft.     Tenderness: There is no abdominal tenderness.  Musculoskeletal:        General: No swelling.     Cervical back:  Neck supple.  Skin:    General: Skin is warm and dry.     Capillary Refill: Capillary refill takes less than 2 seconds.  Neurological:     Mental Status: He is alert. Mental status is at baseline.  Psychiatric:        Mood and Affect: Mood normal.     ED Results / Procedures / Treatments   Labs (all labs ordered are listed, but only abnormal results are displayed) Labs Reviewed  CULTURE, BLOOD (ROUTINE X 2)  CULTURE, BLOOD (ROUTINE X 2)  CBC WITH DIFFERENTIAL/PLATELET  COMPREHENSIVE METABOLIC PANEL  LIPASE, BLOOD  URINALYSIS, ROUTINE W REFLEX MICROSCOPIC  CK  LACTIC ACID, PLASMA  LACTIC ACID, PLASMA    EKG None  Radiology No results found.  Procedures Procedures    Medications Ordered in ED Medications  0.9 %  sodium chloride infusion (has no  administration in time range)  sodium chloride 0.9 % bolus 500 mL (has no administration in time range)    ED Course/ Medical Decision Making/ A&P                                 Medical Decision Making Amount and/or Complexity of Data Reviewed Labs: ordered. Radiology: ordered.  Risk Prescription drug management. Decision regarding hospitalization.   Patient with hypoxia.  Will require readmission.  With the fever temp 100.9 and respiratory rate up a little bit some concern for possible sepsis.  But blood pressures are good.  Patient's initial lactic acid was reassuring at 1.5.  Patient CBC also white blood cell count not elevated 9.5 hemoglobin 12.8 platelets 195.  Blood cultures are pending urinalysis is negative for urinary tract infection.  Complete metabolic panel CK and lipase are pending.  Also formal read of chest x-ray is pending.  Reviewed by me.  Questionable may be something left-sided but no obvious infiltrate.  Patient symptoms may all be secondary to the COVID with hypoxia.  Patient will require readmission once we get more information.  CRITICAL CARE Performed by: Vanetta Mulders Total critical care time: 40 minutes Critical care time was exclusive of separately billable procedures and treating other patients. Critical care was necessary to treat or prevent imminent or life-threatening deterioration. Critical care was time spent personally by me on the following activities: development of treatment plan with patient and/or surrogate as well as nursing, discussions with consultants, evaluation of patient's response to treatment, examination of patient, obtaining history from patient or surrogate, ordering and performing treatments and interventions, ordering and review of laboratory studies, ordering and review of radiographic studies, pulse oximetry and re-evaluation of patient's condition.   Patient with hypoxia.  Known COVID but chest x-ray read as no acute  cardiopulmonary findings.  Patient's labs are significant for a CK of 2986 consistent with rhabdomyolysis.  No significant blood in the urine.  Complete metabolic panel creatinine was 0.98 for GFR 50 but not too far off from baseline for him anion gap is normal at 12 lipase was 32.  Patient's electrolytes was normal.  Patient already given 500 cc bolus normal saline will give another 500 cc.  He is already at 100 cc/h for what we thought was dehydration.  Patient will need admission for the rhabdomyolysis and the hypoxia.  And the failure to thrive.   Patient did have falls around December 6.  But none since.  Patient's CPKs were elevated a little bit but they were coming  down when he was discharged.  They were under thousand at that time.  Now it is significantly elevated at 2000+.  Final Clinical Impression(s) / ED Diagnoses Final diagnoses:  Failure to thrive in adult  Hypoxia  COVID    Rx / DC Orders ED Discharge Orders     None         Vanetta Mulders, MD 09/27/23 1652    Vanetta Mulders, MD 09/27/23 1753    Vanetta Mulders, MD 09/27/23 1754

## 2023-09-27 NOTE — Patient Instructions (Signed)
Visit Information  Thank you for taking time to visit with me today. Please don't hesitate to contact me if I can be of assistance to you before our next scheduled telephone appointment.  Our next call is by telephone when updated are available   Following is a copy of your care plan:   Goals Addressed             This Visit's Progress    TOC Care Plan       Current Barriers:  Care Coordination needs related to Level of care concerns, ADL IADL limitations, Cognitive Deficits, Inability to perform ADL's independently, and Inability to perform IADL's independently Chronic Disease Management support and education needs related to Dementia and _ COVID-19  Lacks caregiver support Cognitive Deficits  RNCM Clinical Goal(s):  Patient will take all medications exactly as prescribed and will call provider for medication related questions as evidenced by no missed medication doses  continue to work with RN Care Manager to address care management and care coordination needs related to  Dementia and _ COVID-19 as evidenced by adherence to CM Team Scheduled appointments work with Child psychotherapist to address  related to the management of Level of care concerns, ADL IADL limitations, Cognitive Deficits, Inability to perform ADL's independently, and Inability to perform IADL's independently related to the management of Dementia and COVID-19, increase independence  as evidenced by review of EMR and patient or Child psychotherapist report through collaboration with Medical illustrator, provider, and care team.   Interventions: Evaluation of current treatment plan related to  self management and patient's adherence to plan as established by provider  Transitions of Care:  New goal. Community Resource Referral Made to address None Doctor Visits  - discussed the importance of doctor visits Communication with PCP, Frances Furbish and SNF re: possible placement Level of Care needs assessed with patient/caregiver, options discussed  with patient/caregiver, orders reviewed, and education provided [facility] provider for patient needs possible SNF Placement Contacted Health RN/OT/PT Frances Furbish [facility] added Nursing [phone])  COVID Interventions:  (Status:  New goal.) Short Term Goal Provided education to patient to enhance basic understanding of COVID-19 as a viral disease, measures to prevent exposure, signs and symptoms, recommended vaccine schedule, when to contact provider Discussed effects, symptoms, and management of "long COVID"' Assessed social determinant of health barriers  Patient Goals/Self-Care Activities: Participate in Transition of Care Program/Attend TOC scheduled calls Take all medications as prescribed Call pharmacy for medication refills 3-7 days in advance of running out of medications Perform all self care activities independently  Perform IADL's (shopping, preparing meals, housekeeping, managing finances) independently Work with the Child psychotherapist to address care coordination needs and will continue to work with the clinical team to address health care and disease management related needs  Follow Up Plan:  Telephone follow up appointment with care management team member scheduled for:  pending-dependent on outcome of  possible placement  The patient has been provided with contact information for the care management team and has been advised to call with any health related questions or concerns.          Reviewed goals for care Patient/ Caregiver  verbalizes understanding of instructions and care plan provided. Patient / Caregiver was encouraged to make informed decisions about care, actively participate in managing health conditions, and implement lifestyle changes as needed to promote independence and self-management of health care   [facility] Case Management Nurse will provide follow-up and on-going assessment evaluation and education of disease processes,  recommended interventions for both chronic and  acute medical conditions ,  along with ongoing review of symptoms ,medication reviews and reconciliation during each weekly  call .  Any updates , inconsistencies, discrepancies or acute care concerns will be addressed and routed to the correct Practitioner if indicated    Please call the care guide team at [phone] , or call me directly at the number below if you need to cancel or reschedule your appointment. Three attempts will be made to reach patient if the scheduled call is missed. If we are unable to reach the patient after 3 attempts the case will be closed and no additional outreach attempts will be made.   If you are experiencing a medical emergency, please call 911 or report to your local emergency department or urgent care.    If you have a non-emergency medical problem during routine business hours, please contact your provider's office and ask to speak with a nurse.    If you are experiencing a Mental Health or Behavioral Health Crisis or need someone to talk to Please call the Suicide and Crisis Lifeline: 58 You may also call the [place] National Suicide Prevention Lifeline: (912)732-9774 or TTY: 670-174-1424 TTY 212-295-4688) to talk to a trained counselor.  Additionally you may call the Behavioral Health Crisis Line at 575-843-4456, at any time, 24 hours a day, 7 days a week- however If you are in danger or need immediate medical attention, call 911.   If you would like help to quit smoking, call 1-800-QUIT-NOW ( (336) 212-8593) OR Espaol: 1-855-Djelo-Ya (2-355-732-2025) o para ms informacin haga clic aqu or Text READY to 427-062 to register via text.  Susa Loffler , BSN, RN Care Management Coordinator [place]   [facility] [email or web address] Direct Dial: [phone]      Susa Loffler , BSN, RN Care Management Coordinator [facility]   [facility] [email or web address] Direct Dial: [phone]

## 2023-09-27 NOTE — Patient Outreach (Signed)
Care Coordination   Initial Visit Note   09/27/2023  Name: Michael [name] MRN: [id number] DOB: Jun 28, 1941  Michael [name] is a 82 y.o. year old male who sees Carylon Perches, MD for primary care. I spoke with patient's wife, Michael [name] and patient's two son's, Michael [name] & Michael [name] by phone today.  What matters to the patients health and wellness today?  Receive Assistance with Higher Level of Care Placement.     Goals Addressed             This Visit's Progress    Receive Assistance with Higher Level of Care Placement.   On track    Care Coordination Interventions:  Interventions Today    Flowsheet Row Most Recent Value  Chronic Disease   Chronic disease during today's visit Other, Chronic Kidney Disease/End Stage Renal Disease (ESRD)  [Major Neurocognitive Disorder, Dementia, Acute Metabolic Encephalopathy, Frequent Falls, Recent Fall w/ Injury, Unsteady Balance/Gait, Inability to Perform Activities of Daily Living Independently, Caregiver Fatigue, Higher Level of Care Placement Needed]  General Interventions   General Interventions Discussed/Reviewed General Interventions Discussed, Labs, Vaccines, Doctor Visits, Health Screening, Annual Foot Exam, General Interventions Reviewed, Communication with, [name], Level of Care, Horticulturist, commercial (DME), Annual Eye Exam  [Encouraged Routine Engagement & Communication with Care Team Members.]  Labs Hgb A1c every 3 months, Kidney Function  [Encouraged Routine Labs.]  Vaccines COVID-19, Flu, Pneumonia, RSV, Shingles, Tetanus/Pertussis/Diphtheria  [Encouraged Annual Vaccinations.]  Doctor Visits Discussed/Reviewed Doctor Visits Discussed, Specialist, Doctor Visits Reviewed, Annual Wellness Visits, PCP  [Encouraged Routine Engagement & Communication with Care Team Members.]  Health Screening Bone Density, Colonoscopy, Prostate  [Encouraged Annual Health Screenings.]  Durable Medical Equipment (DME) BP Cuff,  Other, Ephraim Hamburger, Prescription Eyeglasses.]  PCP/Specialist Visits Compliance with follow-up visit  [Encouraged Routine Engagement & Communication with Care Team Members.]  Communication with PCP/Specialists, RN, Pharmacists, Social Work  [employer] Routine Engagement & Communication with Care Team Members.]  Level of Care Adult Daycare, Air traffic controller, Assisted Living, Skilled Nursing Facility  [Confirmed Disinterest in Enrollment in Adult Day Care Program. Confirmed Interest in Pursusing Higher Level of Care Placement Options (I.e Skilled Nursing for Short-Term Rehabilitative Services Versus Long-Term Memory Care Assisted Living).]  Applications Medicaid, Personal Care Services  [Confirmed Disinterest in Applying for Medicaid or Special Assistance Long-Term Care Medicaid Due to Combined Income Exceeding Poverty Guidelines for State of [place] for Fiscal Year 2024/2025. Confirmed Ineligibility to Apply for Personal Care Services.]  Exercise Interventions   Exercise Discussed/Reviewed Exercise Discussed, Assistive device use and maintanence, Exercise Reviewed, Physical Activity, Weight Managment  [Patient Exercised 7 Days a Week for 30 Minutes Per Day Walking Dog with Wife Prior to Recent Fall.]  Physical Activity Discussed/Reviewed Physical Activity Discussed, Home Exercise Program (HEP), Physical Activity Reviewed, PREP, Gym, Types of exercise  [Confirmed with Wife Patient's Inability to Ambulate or Perform Activities of Daily Living Independently Since Recent Fall.]  Weight Management Weight maintenance  [Encouraged Supplemental Nutrition & Hydration.]  Education Interventions   Education Provided Provided Printed Education, Provided Web-based Education, Provided Education  [Encouraged Review, Consideration & Implementation.]  Provided Verbal Education On Nutrition, Foot Care, Eye Care, Labs, Mental Health/Coping with Illness, When to see the doctor, [name], [employer], Medication,  Exercise, Blood Sugar Monitoring, Applications  [Encouraged Heart-Healthy, Low Fat, Low Sodium, Supplemental Diet.]  Labs Reviewed Hgb A1c  Applications Medicaid, Personal Care Services  [Confirmed Disinterest in Applying for Medicaid or Special Assistance Long-Term Care Medicaid Due to Combined  Income Exceeding Poverty Guidelines for [place] of  for Fiscal Year 2024/2025. Confirmed Ineligibility to Apply for Personal Care Services.]  Mental Health Interventions   Mental Health Discussed/Reviewed Mental Health Discussed, Anxiety, Depression, Grief and Loss, Mental Health Reviewed, Substance Abuse, Coping Strategies, Crisis, Suicide, Other  [Assessed Mental Health & Cognitive Status of Patient & Caregiver, Wife.]  Nutrition Interventions   Nutrition Discussed/Reviewed Nutrition Discussed, Adding fruits and vegetables, Increasing proteins, Nutrition Reviewed, Decreasing fats, Decreasing salt, Fluid intake, Carbohydrate meal planning, Portion sizes, Decreasing sugar intake, Supplemental nutrition  [Confirmed Patient's Lack of Desire to Eat, Drink or Consume Prescription Medications.]  Pharmacy Interventions   Pharmacy Dicussed/Reviewed Pharmacy Topics Discussed, Medications and their functions, Pharmacy Topics Reviewed, Medication Adherence, Affording Medications  [Confirmed Ability to Afford Prescription Medications, But Current Non-Compliance.]  Medication Adherence Not taking medication  [Confirmed with Wife Patient's Desire to Stop Taking Prescription Medications.]  Safety Interventions   Safety Discussed/Reviewed Safety Discussed, Safety Reviewed, Fall Risk, Home Safety  [Encouraged Routine Use of Home Safety Devices & Durable Medical Equipment. Encouraged Consideration of Home Safety Evaluation & Home Health Physical Therapy Services, as Recommended.]  Home Safety Assistive Devices, Need for home safety assessment, Refer for home visit, Refer for community resources, Contact provider for referral to  PT/OT  [Agreed to Contact Primary Care Provider to Request Order for Home Health Physical Therapy Services & Referral to [facility] Agency of Choice. Agreed to Contact Primary Care Provider to Request Review & Signature of FL-2 Form for Placement Purposes.]  Advanced Directive Interventions   Advanced Directives Discussed/Reviewed Advanced Directives Discussed, Advanced Directives Reviewed, Advanced Care Planning, End of Life  [Confirmed Initiation of Advanced Directives (Living Will & Healthcare Power of Attorney Documents) & Encouraged to Provide Hospital Staff or Primary Care Providers with Copies to Scan into Electronic Medical Record in Epic.]  End of Life Hospice, Palliative  [Encouraged Consideration of End-of-Life Care & Palliative Care Consult through [facility].]       Assessed Social Determinant of Health Barriers. Discussed Plans for Ongoing Care Management Follow Up. Provided Careers information officer Information for Care Management Team Members. Screened for Signs & Symptoms of Depression, Related to Chronic Disease State. PHQ2 & PHQ9 Depression Screen Completed & Results Reviewed. Suicidal Ideation & Homicidal Ideation Assessed - None Present.   Domestic Violence Assessed - None Present. Access to Weapons Assessed - None Present.   Active Listening & Reflection Utilized. Verbalization of Feelings Encouraged. Emotional Support Provided. Feelings of Caregiver Burnout Validated. Caregiver Stress Acknowledged. Caregiver Resources Reviewed. Caregiver Support Groups Mailed. Self-Enrollment in Caregiver Support Group of Interest Emphasized. Crisis Support Information, Agencies, Services & Resources Discussed. Problem Solving Interventions Identified. Task-Centered Solutions Implemented.   Solution-Focused Strategies Developed. Acceptance & Commitment Therapy Introduced. Brief Cognitive Behavioral Therapy Initiated. Client-Centered Therapy Enacted. Reviewed Prescription  Medications & Discussed Importance of Compliance. Quality of Sleep Assessed & Sleep Hygiene Techniques Promoted. CSW Collaboration with Wife, Myrel Gwathney to Confirm Her Decision to Contact Emergency Medical Services to Have Patient Transported to [facility] Due to Patient's Current Medical Condition & Mental State. Encouraged Routine Engagement with Danford Bad, Licensed Clinical Social Worker with [facility] [phone]), if You Have Questions, Need Assistance, or If Additional Social Work Needs Are Identified Between Now & Our Next Follow-Up Outreach Call, Scheduled on 10/04/2023 at 3:45 PM.        SDOH assessments and interventions completed:  Yes.  SDOH Interventions Today    Flowsheet  Row Most Recent Value  SDOH Interventions   Food Insecurity Interventions Intervention Not Indicated  Housing Interventions Intervention Not Indicated  Transportation Interventions Intervention Not Indicated, Patient Resources (Friends/Family)  Utilities Interventions Intervention Not Indicated  Alcohol Usage Interventions Intervention Not Indicated (Score <7)  Financial Strain Interventions Intervention Not Indicated  Physical Activity Interventions Intervention Not Indicated, Other (Comments)  [Patient is Now Physically Incapable, Due to Recent Fall.]  Stress Interventions Intervention Not Indicated  Social Connections Interventions Intervention Not Indicated  Health Literacy Interventions Intervention Not Indicated     Care Coordination Interventions:  Yes, provided.   Follow up plan: Follow up call scheduled for 10/04/2023 at 3:45 pm.  Encounter Outcome:  Patient Visit Completed.   Danford Bad, BSW, MSW, Printmaker Social Work Case Set designer Health  [facility], Population Health Direct Dial: [phone]  Fax: [phone] Email: [email or web address]:  [email or web address]

## 2023-09-28 DIAGNOSIS — M6282 Rhabdomyolysis: Secondary | ICD-10-CM | POA: Diagnosis not present

## 2023-09-28 DIAGNOSIS — U071 COVID-19: Secondary | ICD-10-CM | POA: Diagnosis not present

## 2023-09-28 DIAGNOSIS — R0902 Hypoxemia: Secondary | ICD-10-CM | POA: Diagnosis not present

## 2023-09-28 DIAGNOSIS — R627 Adult failure to thrive: Secondary | ICD-10-CM | POA: Diagnosis not present

## 2023-09-28 MED ORDER — PANTOPRAZOLE SODIUM 40 MG PO TBEC
40.0000 mg | DELAYED_RELEASE_TABLET | Freq: Every day | ORAL | Status: DC
  Administered 2023-09-28 – 2023-10-01 (×4): 40 mg via ORAL
  Filled 2023-09-28 (×4): qty 1

## 2023-09-28 MED ORDER — PREDNISONE 20 MG PO TABS
20.0000 mg | ORAL_TABLET | Freq: Every day | ORAL | Status: DC
  Administered 2023-09-29 – 2023-10-01 (×3): 20 mg via ORAL
  Filled 2023-09-28 (×3): qty 1

## 2023-09-28 NOTE — TOC Initial Note (Signed)
Transition of Care [facility]) - Initial/Assessment Note    Patient Details  Name: Michael [name] MRN: [id number] Date of Birth: 05-Mar-1941  Transition of Care [facility] CM/SW Contact:    Elliot Gault, LCSW Phone Number: 09/28/2023, 10:58 AM  Clinical Narrative:                  Pt admitted from home. He was recently discharged from [facility] with [facility]. PT now recommending SNF rehab.  Spoke with pt's wife to review. Pt/wife known to this LCSW from recent admission. At this time, pt's wife would like to pursue SNF rehab for pt. CMS provider options reviewed. Will refer as requested.   Placement may be delayed due to covid diagnosis and quarantine requirements of SNF.  TOC will follow.   Barriers to Discharge: Continued Medical Work up   Patient Goals and CMS Choice Patient states their goals for this hospitalization and ongoing recovery are:: rehab CMS Medicare.gov Compare Post Acute Care list provided to:: Patient Represenative (must comment) Choice offered to / list presented to : Spouse [place] ownership interest in [facility].provided to:: Spouse    Expected Discharge Plan and Services In-house Referral: Clinical Social Work   Post Acute Care Choice: Skilled Nursing Facility Living arrangements for the past 2 months: Single Family Home                                      Prior Living Arrangements/Services Living arrangements for the past 2 months: Single Family Home Lives with:: Spouse Patient language and need for interpreter reviewed:: Yes Do you feel safe going back to the place where you live?: Yes      Need for Family Participation in Patient Care: Yes (Comment) Care giver support system in place?: Yes (comment)   Criminal Activity/Legal Involvement Pertinent to Current Situation/Hospitalization: No - Comment as needed  Activities of Daily Living      Permission Sought/Granted Permission sought to share information with : Facility Games developer granted to share information with : Yes, Verbal Permission Granted     Permission granted to share info w AGENCY: [facility]        Emotional Assessment       Orientation: : Oriented to Self, Oriented to Place Alcohol / Substance Use: Not Applicable Psych Involvement: No (comment)  Admission diagnosis:  Rhabdomyolysis [M62.82] Hypoxia [R09.02] Failure to thrive in adult [R62.7] Non-traumatic rhabdomyolysis [M62.82] COVID [U07.1] Patient Active Problem List   Diagnosis Date Noted   Rhabdomyolysis 09/27/2023   Hypoxia 09/27/2023   COVID 09/27/2023   AKI (acute kidney injury) (HCC) 09/25/2023   COVID-19 virus infection 09/24/2023   Acute metabolic encephalopathy 09/23/2023   Chronic kidney disease, stage 3b (HCC) 09/23/2023   Major neurocognitive disorder (HCC) 09/23/2023   Dementia (HCC) 06/24/2020   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   Lumbosacral spondylosis without myelopathy 07/11/2013   S/P lumbar fusion 07/11/2013   Back pain, chronic 09/19/2012   Leg weakness, bilateral 09/19/2012   Difficulty walking 09/19/2012   Tendonitis of shoulder, right 10/26/2011   Vertigo 10/26/2011   Pain in joint, shoulder region 10/26/2011   Shoulder weakness 10/26/2011   PCP:  Carylon Perches, MD Pharmacy:   [facility] [phone] - [place],  - [address] AT [facility] [address] [place] [address] Phone: [phone] Fax: [phone]  Social Drivers of Health (SDOH) Social History: SDOH Screenings   Food Insecurity: No Food Insecurity (09/28/2023)  Housing: Low Risk  (09/28/2023)  Transportation Needs: No Transportation Needs (09/28/2023)  Utilities: Not At Risk (09/28/2023)  Alcohol Screen: Low Risk  (09/27/2023)  Depression (PHQ2-9): Low Risk  (09/27/2023)  Financial Resource Strain: Low Risk  (09/27/2023)  Physical Activity: Sufficiently Active (09/27/2023)  Recent Concern: Physical  Activity - Inactive (09/27/2023)  Social Connections: Moderately Integrated (09/27/2023)  Stress: No Stress Concern Present (09/27/2023)  Tobacco Use: Medium Risk (09/27/2023)  Health Literacy: Inadequate Health Literacy (09/27/2023)   SDOH Interventions:     Readmission Risk Interventions     No data to display

## 2023-09-28 NOTE — Plan of Care (Signed)

## 2023-09-28 NOTE — Plan of Care (Signed)
  Problem: Acute Rehab PT Goals(only PT should resolve) Goal: Pt Will Go Supine/Side To Sit Outcome: Progressing Flowsheets (Taken 09/28/2023 1412) Pt will go Supine/Side to Sit:  with contact guard assist  with minimal assist Goal: Patient Will Transfer Sit To/From Stand Outcome: Progressing Flowsheets (Taken 09/28/2023 1412) Patient will transfer sit to/from stand: with minimal assist Goal: Pt Will Transfer Bed To Chair/Chair To Bed Outcome: Progressing Flowsheets (Taken 09/28/2023 1412) Pt will Transfer Bed to Chair/Chair to Bed: with min assist Goal: Pt Will Ambulate Outcome: Progressing Flowsheets (Taken 09/28/2023 1412) Pt will Ambulate:  25 feet  with minimal assist  with rolling walker   2:13 PM, 09/28/23 Ocie Bob, MPT Physical Therapist with [facility] [phone] office [phone] mobile phone

## 2023-09-28 NOTE — Evaluation (Signed)
Physical Therapy Evaluation Patient Details Name: RYUSEI MAYS MRN: [id number] DOB: 1941/08/02 Today's Date: 09/28/2023  History of Present Illness  ZMARI RANGER is a 82 y.o. 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  At baseline, they live at home with her wife and have been completely dependent for their ADLs since their recent discharge on 12/14.  However, prior to that hospitalization, he was independent and ambulated without any support and was independent for ADLs.     They presented from home to the ED on 09/27/2023 with FTT x 2 days.  EMS had been called yesterday to the home and family declined to transport him in at that time.  They decided that they would have him transported in today because he has not had more than a cupful of Gatorade since being discharged and has on been unable to stand.  He has also been unable to converse with his family today.  He took his home medications yesterday but has not been able to take any today.  Family endorses that he has continued to have mild productive cough since his discharge 2 days ago.  Patient complains only of lower back pain which is chronic and he has not taken any pain medication for it.   Clinical Impression  Patient demonstrates slow labored movement for rolling to side and sitting up from side lying position with c/o severe back pain, very unsteady on feet when standing without AD, required use of RW due to BEL weakness and limited to a few steps forward/backwards at bedside before having to sit due to fatigue/fall risk.  Patient tolerated sitting up in chair after therapy.  Patient will benefit from continued skilled physical therapy in hospital and recommended venue below to increase strength, balance, endurance for safe ADLs and gait.           If plan is discharge home, recommend the following: A lot of help with  bathing/dressing/bathroom;A lot of help with walking and/or transfers;Help with stairs or ramp for entrance;Assistance with cooking/housework   Can travel by private vehicle   No    Equipment Recommendations None recommended by PT  Recommendations for Other Services       Functional Status Assessment Patient has had a recent decline in their functional status and demonstrates the ability to make significant improvements in function in a reasonable and predictable amount of time.     Precautions / Restrictions Precautions Precautions: Fall Restrictions Weight Bearing Restrictions Per Provider Order: No      Mobility  Bed Mobility Overal bed mobility: Needs Assistance Bed Mobility: Rolling, Sidelying to Sit Rolling: Mod assist Sidelying to sit: Mod assist Supine to sit: Mod assist     General bed mobility comments: slow labored movement with c/o increased back pain, required use of bed rail with HOB flat    Transfers Overall transfer level: Needs assistance Equipment used: None, Rolling walker (2 wheels) Transfers: Sit to/from Stand, Bed to chair/wheelchair/BSC Sit to Stand: Mod assist, Min assist   Step pivot transfers: Mod assist       General transfer comment: Patiet unable to maintain standing balance without AD, required use of RW for safety    Ambulation/Gait Ambulation/Gait assistance: Mod assist Gait Distance (Feet): 8 Feet Assistive device: Rolling walker (2 wheels) Gait Pattern/deviations: Decreased step length - right, Decreased step length - left, Decreased stride length, Shuffle Gait velocity: slow  General Gait Details: limited to a few steps forward/backwards at bedside before having to sit due to fatigue, poor standing balance  Stairs            Wheelchair Mobility     Tilt Bed    Modified Rankin (Stroke Patients Only)       Balance Overall balance assessment: Needs assistance Sitting-balance support: Feet supported, No upper  extremity supported Sitting balance-Leahy Scale: Fair Sitting balance - Comments: fair/good seated at EOB   Standing balance support: Reliant on assistive device for balance, During functional activity, Bilateral upper extremity supported Standing balance-Leahy Scale: Poor Standing balance comment: fair/poor using RW                             Pertinent Vitals/Pain Pain Assessment Pain Assessment: Faces Faces Pain Scale: Hurts even more Pain Location: back Pain Descriptors / Indicators: Sore, Grimacing, Discomfort, Aching Pain Intervention(s): Limited activity within patient's tolerance, Monitored during session, Repositioned    Home Living Family/patient expects to be discharged to:: Private residence Living Arrangements: Spouse/significant other Available Help at Discharge: Family;Available 24 hours/day Type of Home: House Home Access: Stairs to enter Entrance Stairs-Rails: Right;Left;Can reach both Entrance Stairs-Number of Steps: 1 step into garage, 4 steps in front with BLE side rails   Home Layout: One level Home Equipment: Agricultural consultant (2 wheels);Cane - single point;Shower seat Additional Comments: per chart review; pt is a poor historian    Prior Function Prior Level of Function : Needs assist       Physical Assist : Mobility (physical);ADLs (physical) Mobility (physical): Bed mobility;Transfers;Gait;Stairs   Mobility Comments: household and short distanced community ambulator without AD, does not drive ADLs Comments: Independent household, assisted for community     Extremity/Trunk Assessment   Upper Extremity Assessment Upper Extremity Assessment: Defer to OT evaluation    Lower Extremity Assessment Lower Extremity Assessment: Generalized weakness    Cervical / Trunk Assessment Cervical / Trunk Assessment: Normal  Communication   Communication Communication: No apparent difficulties Cueing Techniques: Verbal cues;Tactile cues   Cognition Arousal: Alert   Overall Cognitive Status: History of cognitive impairments - at baseline                                 General Comments: Oriented to person only.        General Comments      Exercises     Assessment/Plan    PT Assessment Patient needs continued PT services  PT Problem List Decreased strength;Decreased activity tolerance;Decreased balance;Decreased mobility       PT Treatment Interventions DME instruction;Gait training;Stair training;Functional mobility training;Therapeutic activities;Therapeutic exercise;Balance training;Patient/family education    PT Goals (Current goals can be found in the Care Plan section)  Acute Rehab PT Goals Patient Stated Goal: return home PT Goal Formulation: With patient Time For Goal Achievement: 10/12/23 Potential to Achieve Goals: Good    Frequency Min 3X/week     Co-evaluation PT/OT/SLP Co-Evaluation/Treatment: Yes Reason for Co-Treatment: To address functional/ADL transfers PT goals addressed during session: Mobility/safety with mobility;Balance;Proper use of DME         AM-PAC PT "6 Clicks" Mobility  Outcome Measure Help needed turning from your back to your side while in a flat bed without using bedrails?: A Little Help needed moving from lying on your back to sitting on the side of a flat bed without using bedrails?:  A Lot Help needed moving to and from a bed to a chair (including a wheelchair)?: A Lot Help needed standing up from a chair using your arms (e.g., wheelchair or bedside chair)?: A Lot Help needed to walk in hospital room?: A Lot Help needed climbing 3-5 steps with a railing? : A Lot 6 Click Score: 13    End of Session   Activity Tolerance: Patient tolerated treatment well;Patient limited by fatigue Patient left: in chair;with call bell/phone within reach;with chair alarm set Nurse Communication: Mobility status PT Visit Diagnosis: Unsteadiness on feet (R26.81);Other  abnormalities of gait and mobility (R26.89);Muscle weakness (generalized) (M62.81)    Time: 0454-0981 PT Time Calculation (min) (ACUTE ONLY): 24 min   Charges:   PT Evaluation $PT Eval Moderate Complexity: 1 Mod PT Treatments $Therapeutic Activity: 23-37 mins PT General Charges $$ ACUTE PT VISIT: 1 Visit         2:11 PM, 09/28/23 Ocie Bob, MPT Physical Therapist with [facility] [phone] office [phone] mobile phone

## 2023-09-28 NOTE — Plan of Care (Signed)
  Problem: Acute Rehab OT Goals (only OT should resolve) Goal: Pt. Will Perform Grooming Flowsheets (Taken 09/28/2023 0948) Pt Will Perform Grooming: with modified independence Goal: Pt. Will Perform Upper Body Dressing Flowsheets (Taken 09/28/2023 0948) Pt Will Perform Upper Body Dressing: with modified independence Goal: Pt. Will Perform Lower Body Dressing Flowsheets (Taken 09/28/2023 0948) Pt Will Perform Lower Body Dressing:  with modified independence  sitting/lateral leans Goal: Pt. Will Transfer To Toilet Flowsheets (Taken 09/28/2023 [phone]) Pt Will Transfer to Toilet:  with modified independence  ambulating Goal: Pt. Will Perform Toileting-Clothing Manipulation Flowsheets (Taken 09/28/2023 0948) Pt Will Perform Toileting - Clothing Manipulation and hygiene: with modified independence Goal: Pt/Caregiver Will Perform Home Exercise Program Flowsheets (Taken 09/28/2023 [phone]) Pt/caregiver will Perform Home Exercise Program:  Increased ROM  Increased strength  Both right and left upper extremity  Independently  Dustie Brittle OT, MOT

## 2023-09-28 NOTE — Evaluation (Signed)
Occupational Therapy Evaluation Patient Details Name: Michael [name] MRN: [id number] DOB: 1940/11/13 Today's Date: 09/28/2023   History of Present Illness Michael [name] is a 82 y.o. 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  At baseline, they live at home with her wife and have been completely dependent for their ADLs since their recent discharge on 12/14.  However, prior to that hospitalization, he was independent and ambulated without any support and was independent for ADLs.     They presented from home to the ED on 09/27/2023 with FTT x 2 days.  EMS had been called yesterday to the home and family declined to transport him in at that time.  They decided that they would have him transported in today because he has not had more than a cupful of Gatorade since being discharged and has on been unable to stand.  He has also been unable to converse with his family today.  He took his home medications yesterday but has not been able to take any today.  Family endorses that he has continued to have mild productive cough since his discharge 2 days ago.  Patient complains only of lower back pain which is chronic and he has not taken any pain medication for it. (per MD)   Clinical Impression   Pt agreeable to OT and PT co-evaluation. Pt not oriented to place, year, or month. Pt unable to provide history which was taken from chart review. Pt required mod A for bed mobility and transfer to chair using RW. Pt required max A for donning socks today at EOB. Weakness noted in bilateral shoulders with limited A/ROM. Pt left in the chair with chair alarm set and call bell within reach. Pt will benefit from continued OT in the hospital and recommended venue below to increase strength, balance, and endurance for safe ADL's.          If plan is discharge home, recommend the following: A lot of help with  walking and/or transfers;A lot of help with bathing/dressing/bathroom;Assistance with cooking/housework;Assist for transportation;Help with stairs or ramp for entrance;Direct supervision/assist for medications management    Functional Status Assessment  Patient has had a recent decline in their functional status and demonstrates the ability to make significant improvements in function in a reasonable and predictable amount of time.  Equipment Recommendations  None recommended by OT           Precautions / Restrictions Precautions Precautions: Fall Restrictions Weight Bearing Restrictions Per Provider Order: No      Mobility Bed Mobility Overal bed mobility: Needs Assistance Bed Mobility: Supine to Sit     Supine to sit: Mod assist     General bed mobility comments: increased low back pain; assist to pull to sit and move B LE to EOB    Transfers Overall transfer level: Needs assistance Equipment used: Rolling walker (2 wheels) Transfers: Sit to/from Stand, Bed to chair/wheelchair/BSC Sit to Stand: Mod assist, Min assist     Step pivot transfers: Mod assist     General transfer comment: Attempted sit to stand from EOB but pt was very unsteady without RW; RW used for steps to chair with mod A      Balance Overall balance assessment: Needs assistance Sitting-balance support: No upper extremity supported, Feet supported Sitting balance-Leahy Scale: Fair Sitting balance - Comments: fair to good seated at EOB   Standing balance  support: During functional activity, Bilateral upper extremity supported Standing balance-Leahy Scale: Poor Standing balance comment: poor to fair with RW; poor without AD                           ADL either performed or assessed with clinical judgement   ADL Overall ADL's : Needs assistance/impaired     Grooming: Minimal assistance;Sitting   Upper Body Bathing: Minimal assistance;Moderate assistance;Sitting   Lower Body  Bathing: Maximal assistance;Sitting/lateral leans   Upper Body Dressing : Minimal assistance;Moderate assistance;Sitting   Lower Body Dressing: Maximal assistance;Sitting/lateral leans   Toilet Transfer: Moderate assistance;Stand-pivot;Rolling walker (2 wheels) Toilet Transfer Details (indicate cue type and reason): Simualted via EOB to chair transfer Toileting- Clothing Manipulation and Hygiene: Maximal assistance;Sitting/lateral lean       Functional mobility during ADLs: Moderate assistance;Rolling walker (2 wheels) General ADL Comments: Ambualted a few steps forward and backwards from chair; unsteady.     Vision Baseline Vision/History: 1 Wears glasses Ability to See in Adequate Light: 0 Adequate Patient Visual Report: No change from baseline Vision Assessment?: Wears glasses for reading     Perception Perception: Not tested       Praxis Praxis: Not tested       Pertinent Vitals/Pain Pain Assessment Pain Assessment: Faces Faces Pain Scale: Hurts even more Pain Location: back Pain Descriptors / Indicators: Grimacing, Guarding Pain Intervention(s): Limited activity within patient's tolerance, Monitored during session, Repositioned     Extremity/Trunk Assessment Upper Extremity Assessment Upper Extremity Assessment: RUE deficits/detail;LUE deficits/detail RUE Deficits / Details: 3-/5 shoulder flexion; generally weak LUE Deficits / Details: 2+/5 shoulder flexion; limited to 75% of available passive range of motion.   Lower Extremity Assessment Lower Extremity Assessment: Defer to PT evaluation   Cervical / Trunk Assessment Cervical / Trunk Assessment: Normal   Communication Communication Communication: No apparent difficulties Cueing Techniques: Verbal cues;Tactile cues   Cognition Arousal: Alert Behavior During Therapy: WFL for tasks assessed/performed Overall Cognitive Status: History of cognitive impairments - at baseline                                  General Comments: Oriented to person only.                      Home Living Family/patient expects to be discharged to:: Private residence Living Arrangements: Spouse/significant other Available Help at Discharge: Family;Available 24 hours/day Type of Home: House Home Access: Stairs to enter [employer] of Steps: 1 step into garage, 4 steps in front with BLE side rails   Home Layout: One level     Bathroom Shower/Tub: Producer, television/film/video: Handicapped height Bathroom Accessibility: Yes   Home Equipment: Agricultural consultant (2 wheels);Cane - single point;Shower seat   Additional Comments: per chart review; pt is a poor historian      Prior Functioning/Environment Prior Level of Function : Needs assist       Physical Assist : Mobility (physical);ADLs (physical) Mobility (physical): Bed mobility;Transfers;Gait;Stairs   Mobility Comments: household and short distanced community ambulator without AD, does not drive (per chart) ADLs Comments: Independent household, assisted for community (per chart)        OT Problem List: Decreased strength;Decreased range of motion;Decreased activity tolerance;Impaired balance (sitting and/or standing);Decreased cognition;Pain      OT Treatment/Interventions: Self-care/ADL training;Therapeutic exercise;DME and/or AE instruction;Therapeutic activities;Patient/family education  OT Goals(Current goals can be found in the care plan section) Acute Rehab OT Goals Patient Stated Goal: Return home OT Goal Formulation: With patient Time For Goal Achievement: 10/12/23 Potential to Achieve Goals: Good  OT Frequency: Min 2X/week    Co-evaluation PT/OT/SLP Co-Evaluation/Treatment: Yes Reason for Co-Treatment: To address functional/ADL transfers   OT goals addressed during session: ADL's and self-care                       End of Session Equipment Utilized During Treatment: Rolling walker (2  wheels)  Activity Tolerance: Patient tolerated treatment well Patient left: in chair;with call bell/phone within reach;with chair alarm set  OT Visit Diagnosis: Unsteadiness on feet (R26.81);Other abnormalities of gait and mobility (R26.89);Muscle weakness (generalized) (M62.81)                Time: 5784-6962 OT Time Calculation (min): 18 min Charges:  OT General Charges $OT Visit: 1 Visit OT Evaluation $OT Eval Low Complexity: 1 Low  Lasheena Frieze OT, MOT  Danie Chandler 09/28/2023, 9:46 AM

## 2023-09-28 NOTE — Plan of Care (Signed)
  Problem: Education: Goal: Knowledge of risk factors and measures for prevention of condition will improve Outcome: Progressing   Problem: Coping: Goal: Psychosocial and spiritual needs will be supported Outcome: Progressing   Problem: Respiratory: Goal: Will maintain a patent airway Outcome: Progressing Goal: Complications related to the disease process, condition or treatment will be avoided or minimized Outcome: Progressing   Problem: Health Behavior/Discharge Planning: Goal: Ability to manage health-related needs will improve Outcome: Progressing

## 2023-09-28 NOTE — [place] FL2 (Signed)
[place] MEDICAID FL2 LEVEL OF CARE FORM     IDENTIFICATION  Patient Name: Michael [name] Birthdate: Aug 06, 1941 Sex: male Admission Date (Current Location): 09/27/2023  [facility] and [place] Number:  [employer] and Address:  [facility],  [address], [place] [address]      Provider Number: [id number]  Attending Physician Name and Address:  Vassie Loll, MD  Relative Name and Phone Number:       Current Level of Care: Hospital Recommended Level of Care: Skilled Nursing Facility Prior Approval Number:    Date Approved/Denied:   PASRR Number: [id number]  Discharge Plan: SNF    Current Diagnoses: Patient Active Problem List   Diagnosis Date Noted   Rhabdomyolysis 09/27/2023   Hypoxia 09/27/2023   COVID 09/27/2023   AKI (acute kidney injury) (HCC) 09/25/2023   COVID-19 virus infection 09/24/2023   Acute metabolic encephalopathy 09/23/2023   Chronic kidney disease, stage 3b (HCC) 09/23/2023   Major neurocognitive disorder (HCC) 09/23/2023   Dementia (HCC) 06/24/2020   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   Lumbosacral spondylosis without myelopathy 07/11/2013   S/P lumbar fusion 07/11/2013   Back pain, chronic 09/19/2012   Leg weakness, bilateral 09/19/2012   Difficulty walking 09/19/2012   Tendonitis of shoulder, right 10/26/2011   Vertigo 10/26/2011   Pain in joint, shoulder region 10/26/2011   Shoulder weakness 10/26/2011    Orientation RESPIRATION BLADDER Height & Weight     Self, Place  Normal Incontinent Weight: 132 lb 4.4 oz (60 kg) Height:  5\' 8"  (172.7 cm)  BEHAVIORAL SYMPTOMS/MOOD NEUROLOGICAL BOWEL NUTRITION STATUS      Continent Diet (see dc summary)  AMBULATORY STATUS COMMUNICATION OF NEEDS Skin   Extensive Assist Verbally Normal                       Personal Care Assistance Level of Assistance  Bathing, Feeding, Dressing Bathing Assistance: Limited assistance Feeding assistance: Limited  assistance Dressing Assistance: Limited assistance     Functional Limitations Info  Sight, Hearing, Speech Sight Info: Adequate Hearing Info: Adequate Speech Info: Adequate    SPECIAL CARE FACTORS FREQUENCY  PT (By licensed PT), OT (By licensed OT)     PT Frequency: 5x week OT Frequency: 5x week            Contractures Contractures Info: Not present    Additional Factors Info  Code Status, Allergies Code Status Info: DNR Allergies Info: NKA           Current Medications (09/28/2023):  This is the current hospital active medication list Current Facility-Administered Medications  Medication Dose Route Frequency Provider Last Rate Last Admin   0.9 %  sodium chloride infusion   Intravenous Continuous Leeroy Bock, MD   Stopped at 09/28/23 0321   acetaminophen (TYLENOL) tablet 650 mg  650 mg Oral Q6H PRN Leeroy Bock, MD   650 mg at 09/28/23 1014   Or   acetaminophen (TYLENOL) suppository 650 mg  650 mg Rectal Q6H PRN Leeroy Bock, MD       bisacodyl (DULCOLAX) EC tablet 5 mg  5 mg Oral Daily PRN Leeroy Bock, MD       diclofenac Sodium (VOLTAREN) 1 % topical gel 2 g  2 g Topical QID Leeroy Bock, MD   2 g at 09/28/23 0815   enoxaparin (LOVENOX) injection 40 mg  40 mg Subcutaneous Q24H Leeroy Bock, MD   40 mg  at 09/27/23 2321   polyethylene glycol (MIRALAX / GLYCOLAX) packet 17 g  17 g Oral Daily Leeroy Bock, MD   17 g at 09/28/23 0815     Discharge Medications: Please see discharge summary for a list of discharge medications.  Relevant Imaging Results:  Relevant Lab Results:   Additional Information SSN: [id number]  (Covid+ 09/23/23)  Elliot Gault, LCSW

## 2023-09-28 NOTE — Progress Notes (Signed)
  Progress Note   Patient: Michael [name]:[id number] DOB: [birth year] DOA: 09/27/2023     1 DOS: the patient was seen and examined on 09/28/2023   Brief hospital admission course: Michael [name] is a 82 y.o. 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 At baseline, he live at home with her wife and have been completely dependent for their ADLs since their recent discharge on 12/14.  However, prior to that hospitalization, he was independent and ambulated without any support and was independent for ADLs.  Patient demonstrating acute dehydration, mild uremia, worsening confusion and significant deconditioning and decline.  Per family significant complaints of back pain, no eating, not drinking and unable to perform his activities as previously independently.  Assessment and Plan: 1-acute metabolic encephalopathy -Appears to be secondary to dehydration and mild uremia -Continue fluid resuscitation and follow clinical response. -Continue constant reorientation and minimize sedative agents.  2-COVID infection -Transient use of oxygen supplementation at time of admission -At time of examination demonstrating adequate saturation on room air -Continue the use of mucolytic's, antitussives, as needed nebulizer and will to short course of low-dose prednisone -Continue supportive care and follow clinical response.  3-back pain -Patient with multiple lumbar fusions in the past -Recent images demonstrating a stable hardware -Per family after recent fall complaining of significant pain -Continue as needed analgesics using Tylenol and will pursued low dose prednisone for 5 days.  4-acute kidney injury -Appears to be prerenal in nature -Minimize the use of nephrotoxic agents and continue fluid resuscitation -Follow renal function trend.  5-physical deconditioning -Patient has been  evaluated by physical therapy with recommendations for skilled nursing facility at time of discharge to pursued rehabilitation and conditioning. -TOC aware and helping with placement.  6-history of coronary artery disease/hyperlipidemia -No chest pain -Continue risk factor modification and home cardiac therapy.  7-history of dementia -Continue constant reorientation and supportive care -Continue treatment with Aricept and Namenda.   Subjective:  Currently afebrile, no nausea, no chest pain, good saturation appreciated on room air.  Chronically ill and deconditioned on exam.  Pleasantly confused.  Physical Exam: Vitals:   09/27/23 2054 09/28/23 0359 09/28/23 0820 09/28/23 1400  BP: (!) 157/69 (!) 166/72 (!) 145/84 (!) 118/48  Pulse: 60 (!) 51 (!) 48 (!) 49  Resp: 20 14 16 17   Temp: 99.9 F (37.7 C) 97.9 F (36.6 C) 97.8 F (36.6 C) 98 F (36.7 C)  TempSrc: Axillary Oral Oral   SpO2: 95% 97% 95% 93%  Weight:      Height:       General exam: Pleasantly confused, following commands appropriately; per family and nursing staff decreased oral intake appreciated. Respiratory system: Positive rhonchi bilaterally; good saturation on room air at rest. Cardiovascular system:RRR.  No rubs or gallops.  No JVD. Gastrointestinal system: Abdomen is nondistended, soft and nontender. No organomegaly or masses felt. Normal bowel sounds heard. Central nervous system: Moving 4 limbs spontaneously.  No focal neurological deficits. Extremities: No cyanosis or clubbing. Skin: No petechiae. Psychiatry: Judgement and insight appear normal. Mood & affect appropriate.   Family Communication: Wife and son at bedside.  Disposition: Status is: Inpatient Remains inpatient appropriate because: Continue IV fluids.   Planned Discharge Destination: Skilled nursing facility  Time spent: 50 minutes  Author: Vassie Loll, MD 09/28/2023 6:45 PM  For on call review [email or web address].

## 2023-09-29 DIAGNOSIS — M6282 Rhabdomyolysis: Secondary | ICD-10-CM | POA: Diagnosis not present

## 2023-09-29 DIAGNOSIS — R0902 Hypoxemia: Secondary | ICD-10-CM | POA: Diagnosis not present

## 2023-09-29 DIAGNOSIS — U071 COVID-19: Secondary | ICD-10-CM | POA: Diagnosis not present

## 2023-09-29 LAB — CBC
HCT: 32.1 % — ABNORMAL LOW (ref 39.0–52.0)
Hemoglobin: 10.4 g/dL — ABNORMAL LOW (ref 13.0–17.0)
MCH: 29.3 pg (ref 26.0–34.0)
MCHC: 32.4 g/dL (ref 30.0–36.0)
MCV: 90.4 fL (ref 80.0–100.0)
Platelets: 167 10*3/uL (ref 150–400)
RBC: 3.55 MIL/uL — ABNORMAL LOW (ref 4.22–5.81)
RDW: 14 % (ref 11.5–15.5)
WBC: 4.1 10*3/uL (ref 4.0–10.5)
nRBC: 0 % (ref 0.0–0.2)

## 2023-09-29 LAB — BASIC METABOLIC PANEL
Anion gap: 7 (ref 5–15)
BUN: 43 mg/dL — ABNORMAL HIGH (ref 8–23)
CO2: 21 mmol/L — ABNORMAL LOW (ref 22–32)
Calcium: 7.7 mg/dL — ABNORMAL LOW (ref 8.9–10.3)
Chloride: 113 mmol/L — ABNORMAL HIGH (ref 98–111)
Creatinine, Ser: 1.23 mg/dL (ref 0.61–1.24)
GFR, Estimated: 58 mL/min — ABNORMAL LOW (ref >60)
Glucose, Bld: 84 mg/dL (ref 70–99)
Potassium: 3 mmol/L — ABNORMAL LOW (ref 3.5–5.1)
Sodium: 141 mmol/L (ref 135–145)

## 2023-09-29 LAB — CK: Total CK: 988 U/L — ABNORMAL HIGH (ref 49–397)

## 2023-09-29 MED ORDER — POTASSIUM CHLORIDE CRYS ER 20 MEQ PO TBCR
40.0000 meq | EXTENDED_RELEASE_TABLET | Freq: Once | ORAL | Status: AC
  Administered 2023-09-29: 40 meq via ORAL
  Filled 2023-09-29: qty 2

## 2023-09-29 NOTE — Progress Notes (Signed)
Took medication this evening and has been resting well tonight.  IV was wrapped with gauze and no mitts applied.  Wife went home early in the evening.

## 2023-09-29 NOTE — Progress Notes (Signed)
Occupational Therapy Treatment Patient Details Name: Michael [name] MRN: [id number] DOB: 11-14-1940 Today's Date: 09/29/2023   History of present illness Michael [name] is a 82 y.o. 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  At baseline, they live at home with her wife and have been completely dependent for their ADLs since their recent discharge on 12/14.  However, prior to that hospitalization, he was independent and ambulated without any support and was independent for ADLs.     They presented from home to the ED on 09/27/2023 with FTT x 2 days.  EMS had been called yesterday to the home and family declined to transport him in at that time.  They decided that they would have him transported in today because he has not had more than a cupful of Gatorade since being discharged and has on been unable to stand.  He has also been unable to converse with his family today.  He took his home medications yesterday but has not been able to take any today.  Family endorses that he has continued to have mild productive cough since his discharge 2 days ago.  Patient complains only of lower back pain which is chronic and he has not taken any pain medication for it.   OT comments  Pt agreeable to OT treatment. Pt lethargic initially but able to wake once seated at EOB. Poor seated balance with instances of posterior leaning. Min A to mod A for rolling and mod A to sit up at EOB. Pt required mod A for transfer to chair from EOB with short, unsteady steps to chair with assist to manage RW. Pt also completed some B UE strengthening but with continued limited A/ROM. Pt left in the chair with family present. Pt will benefit from continued OT in the hospital and recommended venue below to increase strength, balance, and endurance for safe ADL's.          If plan is discharge home, recommend the  following:  A lot of help with walking and/or transfers;A lot of help with bathing/dressing/bathroom;Assistance with cooking/housework;Assist for transportation;Help with stairs or ramp for entrance;Direct supervision/assist for medications management   Equipment Recommendations  None recommended by OT          Precautions / Restrictions Precautions Precautions: Fall Restrictions Weight Bearing Restrictions Per Provider Order: No       Mobility Bed Mobility Overal bed mobility: Needs Assistance Bed Mobility: Rolling, Sidelying to Sit Rolling: Min assist, Mod assist Sidelying to sit: Mod assist       General bed mobility comments: slow labored movement; grimacing; assist to move go form sidelying to sit.    Transfers Overall transfer level: Needs assistance Equipment used: Rolling walker (2 wheels) Transfers: Sit to/from Stand, Bed to chair/wheelchair/BSC Sit to Stand: Mod assist, Min assist     Step pivot transfers: Mod assist     General transfer comment: Time and assist to manage RW and take very short, choppy steps towards chair from EOB; unsteady in standing with much tactile cuing/ assist.     Balance Overall balance assessment: Needs assistance Sitting-balance support: Feet supported, No upper extremity supported Sitting balance-Leahy Scale: Poor Sitting balance - Comments: poor to fair seated at EOB Postural control: Posterior lean Standing balance support: Reliant on assistive device for balance, During functional activity, Bilateral upper extremity supported Standing balance-Leahy Scale: Poor Standing balance comment: using RW  Cognition Arousal: Alert Behavior During Therapy: WFL for tasks assessed/performed Overall Cognitive Status: History of cognitive impairments - at baseline                                          Exercises Exercises: General Upper Extremity General Exercises - Upper  Extremity Shoulder Flexion: AROM, 5 reps, Both, Seated (limited to 50% A/ROM with L UE and just close to 75% avialable range for R UE. x10 protraction completed as well.) Shoulder ABduction:  (Atttempted but poor A/ROM in for this movement in brief attempt.)                 Pertinent Vitals/ Pain       Pain Assessment Pain Assessment: Faces Faces Pain Scale: Hurts little more Pain Location: back Pain Descriptors / Indicators: Sore, Grimacing, Discomfort Pain Intervention(s): Limited activity within patient's tolerance, Monitored during session, Repositioned                                                          Frequency  Min 2X/week        Progress Toward Goals  OT Goals(current goals can now be found in the care plan section)  Progress towards OT goals: Progressing toward goals  Acute Rehab OT Goals Patient Stated Goal: return home OT Goal Formulation: With patient Time For Goal Achievement: 10/12/23 Potential to Achieve Goals: Good ADL Goals Pt Will Perform Grooming: with modified independence Pt Will Perform Upper Body Dressing: with modified independence Pt Will Perform Lower Body Dressing: with modified independence;sitting/lateral leans Pt Will Transfer to Toilet: with modified independence;ambulating Pt Will Perform Toileting - Clothing Manipulation and hygiene: with modified independence Pt/caregiver will Perform Home Exercise Program: Increased ROM;Increased strength;Both right and left upper extremity;Independently  Plan                                      End of Session Equipment Utilized During Treatment: Gait belt;Rolling walker (2 wheels)  OT Visit Diagnosis: Unsteadiness on feet (R26.81);Other abnormalities of gait and mobility (R26.89);Muscle weakness (generalized) (M62.81)   Activity Tolerance Patient tolerated treatment well   Patient Left in chair;with call bell/phone within reach              Time: 7846-9629 OT Time Calculation (min): 21 min  Charges: OT General Charges $OT Visit: 1 Visit OT Treatments $Therapeutic Exercise: 8-22 mins  Zaliah Wissner OT, MOT  Danie Chandler 09/29/2023, 1:52 PM

## 2023-09-29 NOTE — Progress Notes (Signed)
Progress Note   Patient: Michael [name] ZMO:[id number] DOB: May 24, 1941 DOA: 09/27/2023     2 DOS: the patient was seen and examined on 09/29/2023   Brief hospital admission course: Michael [name] is a 82 y.o. 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 At baseline, he live at home with her wife and have been completely dependent for their ADLs since their recent discharge on 12/14.  However, prior to that hospitalization, he was independent and ambulated without any support and was independent for ADLs.  Patient demonstrating acute dehydration, mild uremia, worsening confusion and significant deconditioning and decline.  Per family significant complaints of back pain, no eating, not drinking and unable to perform his activities as previously independently.  Assessment and Plan: 1-acute metabolic encephalopathy -Appears to be secondary to dehydration and mild uremia -Continue fluid resuscitation and follow clinical response. -Continue constant reorientation and minimize sedative agents. -Continue supportive care.  2-COVID infection -Transient use of oxygen supplementation at time of admission -At time of examination demonstrating adequate saturation on room air -Continue the use of mucolytic's, antitussives, as needed nebulizer and will to short course of low-dose prednisone -Will continue supportive care and follow clinical response.  3-back pain -Patient with multiple lumbar fusions in the past -Recent images demonstrating a stable hardware -Per family after recent fall complaining of significant pain -Continue as needed analgesics using Tylenol and continue  low dose prednisone for 5 days.  4-acute kidney injury -Appears to be prerenal in nature -Minimize the use of nephrotoxic agents and continue fluid resuscitation -Follow renal function trend.  5-physical  deconditioning -Patient has been evaluated by physical therapy with recommendations for skilled nursing facility at time of discharge to pursued rehabilitation and conditioning. -TOC aware and helping with placement.  6-history of coronary artery disease/hyperlipidemia -No chest pain -Continue risk factor modification and home cardiac therapy.  7-history of dementia -Continue constant reorientation and supportive care -Continue treatment with Aricept and Namenda.   Subjective:  No fever, no chest pain, no nausea, no vomiting.  Good saturation on room air on today's exam.  Overall reports feeling better and was more interactive today's exam.  Patient reporting no significant back pain.  Physical Exam: Vitals:   09/28/23 1400 09/28/23 2204 09/29/23 0544 09/29/23 1406  BP: (!) 118/48 137/64 (!) 161/66 (!) 140/68  Pulse: (!) 49 (!) 43 (!) 43 (!) 48  Resp: 17 14 16 16   Temp: 98 F (36.7 C) 98.4 F (36.9 C) 98 F (36.7 C) 98.2 F (36.8 C)  TempSrc:  Oral Oral Oral  SpO2: 93% 91% 93% 98%  Weight:      Height:       General exam: Alert, awake and more interactive on today's exam; no fever, no chest pain, no nausea or vomiting.  Continue reporting decreased appetite and per family not much oral intake. Respiratory system: Good aeration on room air; positive rhonchi, no wheezing, no using accessory muscle. Cardiovascular system:RRR. No rubs or gallops; no JVD. Gastrointestinal system: Abdomen is nondistended, soft and nontender. No organomegaly or masses felt. Normal bowel sounds heard. Central nervous system: Moving 4 limbs spontaneously.  No focal neurological deficits. Extremities: No cyanosis or clubbing. Skin: No petechiae. Psychiatry: Judgement and insight appear impaired secondary to underlying dementia.   Family Communication: Wife and son at bedside.  Disposition: Status is: Inpatient Remains inpatient appropriate because: Continue IV fluids.   Planned  Discharge  Destination: Skilled nursing facility  Time spent: 50 minutes  Author: Vassie Loll, MD 09/29/2023 5:14 PM  For on call review [email or web address].

## 2023-09-30 DIAGNOSIS — R0902 Hypoxemia: Secondary | ICD-10-CM | POA: Diagnosis not present

## 2023-09-30 DIAGNOSIS — U071 COVID-19: Secondary | ICD-10-CM | POA: Diagnosis not present

## 2023-09-30 DIAGNOSIS — M6282 Rhabdomyolysis: Secondary | ICD-10-CM | POA: Diagnosis not present

## 2023-09-30 LAB — BASIC METABOLIC PANEL
Anion gap: 7 (ref 5–15)
BUN: 41 mg/dL — ABNORMAL HIGH (ref 8–23)
CO2: 20 mmol/L — ABNORMAL LOW (ref 22–32)
Calcium: 7.8 mg/dL — ABNORMAL LOW (ref 8.9–10.3)
Chloride: 112 mmol/L — ABNORMAL HIGH (ref 98–111)
Creatinine, Ser: 1.17 mg/dL (ref 0.61–1.24)
GFR, Estimated: >60 mL/min (ref >60)
Glucose, Bld: 80 mg/dL (ref 70–99)
Potassium: 3.3 mmol/L — ABNORMAL LOW (ref 3.5–5.1)
Sodium: 139 mmol/L (ref 135–145)

## 2023-09-30 NOTE — Progress Notes (Signed)
  Progress Note   Patient: Michael [name] [id number] DOB: Sep 24, 1941 DOA: 09/27/2023     3 DOS: the patient was seen and examined on 09/30/2023   Brief hospital admission course: JAHSAI PENNACHIO is a 82 y.o. 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 At baseline, he live at home with her wife and have been completely dependent for their ADLs since their recent discharge on 12/14.  However, prior to that hospitalization, he was independent and ambulated without any support and was independent for ADLs.  Patient demonstrating acute dehydration, mild uremia, worsening confusion and significant deconditioning and decline.  Per family significant complaints of back pain, no eating, not drinking and unable to perform his activities as previously independently.  Assessment and Plan: 1-acute metabolic encephalopathy -Appears to be secondary to dehydration and mild uremia -Continue fluid resuscitation and follow clinical response. -Continue constant reorientation and minimize sedative agents. -Continue supportive care.  2-COVID infection -Transient use of oxygen supplementation at time of admission -At time of examination demonstrating adequate saturation on room air -Continue the use of mucolytic's, antitussives, as needed nebulizer and will to short course of low-dose prednisone -Will continue supportive care and follow clinical response.  3-back pain -Patient with multiple lumbar fusions in the past -Recent images demonstrating a stable hardware -Per family after recent fall complaining of significant pain -Continue as needed analgesics using Tylenol and continue  low dose prednisone for 5 days.  4-acute kidney injury -Appears to be prerenal in nature -Minimize the use of nephrotoxic agents and continue fluid resuscitation -Follow renal function trend.  5-physical  deconditioning -Patient has been evaluated by physical therapy with recommendations for skilled nursing facility at time of discharge to pursued rehabilitation and conditioning. -TOC aware and helping with placement.  6-history of coronary artery disease/hyperlipidemia -No chest pain -Continue risk factor modification and home cardiac therapy.  7-history of dementia -Continue constant reorientation and supportive care -Continue treatment with Aricept and Namenda.   Subjective:  No fever, no chest pain, no nausea, no vomiting.  Taking meds, drinking ensures but no eating much.  Physical Exam: Vitals:   09/30/23 0515 09/30/23 0754 09/30/23 0806 09/30/23 1353  BP: (!) 172/68 (!) 170/66 (!) 147/64 (!) 162/66  Pulse: (!) 45 (!) 55  (!) 45  Resp: 20 18  18   Temp: 97.7 F (36.5 C) (!) 97.4 F (36.3 C)  98.1 F (36.7 C)  TempSrc: Axillary Oral  Oral  SpO2: 94% 98%  95%  Weight:      Height:       General exam: Alert, awake and able to follow commands.  Poor insight appreciated on exam.  No fever, no chest pain, no nausea, no vomiting. Respiratory system: Good saturation on room air; positive rhonchi appreciated bilaterally. Cardiovascular system:RRR. No rubs or gallops.  No JVD. Gastrointestinal system: Abdomen is nondistended, soft and nontender. No organomegaly or masses felt. Normal bowel sounds heard. Central nervous system: Moving 4 limbs spontaneously.  No focal neurological deficits. Extremities: No cyanosis or clubbing. Skin: No petechiae. Psychiatry: Judgement and insight appear impaired secondary to dementia.  Family Communication: Wife at bedside.  Disposition: Status is: Inpatient Remains inpatient appropriate because: Continue IV fluids.   Planned Discharge Destination: Skilled nursing facility  Time spent: 50 minutes  Author: Vassie Loll, MD 09/30/2023 4:31 PM  For on call review [email or web address].

## 2023-09-30 NOTE — Progress Notes (Signed)
Mobility Specialist Progress Note:    09/30/23 1512  Mobility  Activity Stood at bedside;Ambulated with assistance in room (took a few steps)  Level of Assistance Moderate assist, patient does 50-74%  Assistive Device None  Distance Ambulated (ft) 4 ft  Range of Motion/Exercises Active;All extremities  Activity Response Tolerated well  Mobility Referral Yes  Mobility visit 1 Mobility  Mobility Specialist Start Time (ACUTE ONLY) 1445  Mobility Specialist Stop Time (ACUTE ONLY) 1512  Mobility Specialist Time Calculation (min) (ACUTE ONLY) 27 min   Pt received in bed, wife in room. Agreeable to mobility, required ModA to stand and take a few steps with no AD. Tolerated well, pt frustrated throughout session. Returned pt supine, alarm on. All needs met.   Lawerance Bach Mobility Specialist Please contact via Special educational needs teacher or  Rehab office at [phone]

## 2023-09-30 NOTE — TOC Progression Note (Signed)
Transition of Care [facility]) - Progression Note    Patient Details  Name: Michael [name] MRN: [id number] Date of Birth: [birth year]  Transition of [facility]) CM/SW Contact  Elliot Gault, LCSW Phone Number: 09/30/2023, 11:59 AM  Clinical Narrative:     TOC following. Spoke with pt's wife to update on SNF bed offers. She selected [facility]. Updated Kerri at [facility] who states they can accept pt when he is medically stable.    Will start insurance auth. MD anticipating dc in 1-2 days.   Barriers to Discharge: Continued Medical Work up  Expected Discharge Plan and Services In-house Referral: Clinical Social Work   Post Acute Care Choice: Skilled Nursing Facility Living arrangements for the past 2 months: Single Family Home                                       Social Determinants of Health (SDOH) Interventions SDOH Screenings   Food Insecurity: No Food Insecurity (09/28/2023)  Housing: Low Risk  (09/28/2023)  Transportation Needs: No Transportation Needs (09/28/2023)  Utilities: Not At Risk (09/28/2023)  Alcohol Screen: Low Risk  (09/27/2023)  Depression (PHQ2-9): Low Risk  (09/27/2023)  Financial Resource Strain: Low Risk  (09/27/2023)  Physical Activity: Sufficiently Active (09/27/2023)  Recent Concern: Physical Activity - Inactive (09/27/2023)  Social Connections: Moderately Integrated (09/27/2023)  Stress: No Stress Concern Present (09/27/2023)  Tobacco Use: Medium Risk (09/27/2023)  Health Literacy: Inadequate Health Literacy (09/27/2023)    Readmission Risk Interventions     No data to display

## 2023-10-01 DIAGNOSIS — I2510 Atherosclerotic heart disease of native coronary artery without angina pectoris: Secondary | ICD-10-CM | POA: Diagnosis not present

## 2023-10-01 DIAGNOSIS — J3089 Other allergic rhinitis: Secondary | ICD-10-CM | POA: Diagnosis not present

## 2023-10-01 DIAGNOSIS — R627 Adult failure to thrive: Secondary | ICD-10-CM | POA: Diagnosis not present

## 2023-10-01 DIAGNOSIS — I700 Atherosclerosis of aorta: Secondary | ICD-10-CM | POA: Diagnosis not present

## 2023-10-01 DIAGNOSIS — M47817 Spondylosis without myelopathy or radiculopathy, lumbosacral region: Secondary | ICD-10-CM | POA: Diagnosis not present

## 2023-10-01 DIAGNOSIS — E785 Hyperlipidemia, unspecified: Secondary | ICD-10-CM | POA: Diagnosis not present

## 2023-10-01 DIAGNOSIS — U071 COVID-19: Secondary | ICD-10-CM | POA: Diagnosis not present

## 2023-10-01 DIAGNOSIS — R0902 Hypoxemia: Secondary | ICD-10-CM | POA: Diagnosis not present

## 2023-10-01 DIAGNOSIS — I10 Essential (primary) hypertension: Secondary | ICD-10-CM | POA: Diagnosis not present

## 2023-10-01 DIAGNOSIS — H5440 Blindness, one eye, unspecified eye: Secondary | ICD-10-CM | POA: Diagnosis not present

## 2023-10-01 DIAGNOSIS — N1832 Chronic kidney disease, stage 3b: Secondary | ICD-10-CM | POA: Diagnosis not present

## 2023-10-01 DIAGNOSIS — E876 Hypokalemia: Secondary | ICD-10-CM | POA: Diagnosis not present

## 2023-10-01 DIAGNOSIS — G9341 Metabolic encephalopathy: Secondary | ICD-10-CM | POA: Diagnosis not present

## 2023-10-01 DIAGNOSIS — M6282 Rhabdomyolysis: Secondary | ICD-10-CM | POA: Diagnosis not present

## 2023-10-01 DIAGNOSIS — N179 Acute kidney failure, unspecified: Secondary | ICD-10-CM | POA: Diagnosis not present

## 2023-10-01 DIAGNOSIS — Z87891 Personal history of nicotine dependence: Secondary | ICD-10-CM | POA: Diagnosis not present

## 2023-10-01 DIAGNOSIS — F0390 Unspecified dementia without behavioral disturbance: Secondary | ICD-10-CM | POA: Diagnosis not present

## 2023-10-01 MED ORDER — PANTOPRAZOLE SODIUM 40 MG PO TBEC: 40.0000 mg | DELAYED_RELEASE_TABLET | Freq: Every day | ORAL | 1 refills | Status: DC

## 2023-10-01 MED ORDER — DM-GUAIFENESIN ER 30-600 MG PO TB12: 1.0000 | ORAL_TABLET | Freq: Two times a day (BID) | ORAL | Status: AC

## 2023-10-01 MED ORDER — POLYETHYLENE GLYCOL 3350 17 G PO PACK: 17.0000 g | PACK | Freq: Every day | ORAL | Status: AC | PRN

## 2023-10-01 MED ORDER — PREDNISONE 20 MG PO TABS: 20.0000 mg | ORAL_TABLET | Freq: Every day | ORAL | Status: AC

## 2023-10-01 MED ORDER — ACETAMINOPHEN 325 MG PO TABS: 650.0000 mg | ORAL_TABLET | Freq: Four times a day (QID) | ORAL | Status: AC | PRN

## 2023-10-01 MED ORDER — BISACODYL 5 MG PO TBEC: 5.0000 mg | DELAYED_RELEASE_TABLET | Freq: Every day | ORAL | Status: AC | PRN

## 2023-10-01 NOTE — Plan of Care (Signed)

## 2023-10-01 NOTE — Discharge Summary (Signed)
Physician Discharge Summary   Patient: Michael [name] MRN: [id number] DOB: 1941-09-30  Admit date:     09/27/2023  Discharge date: 10/01/23  Discharge Physician: Vassie Loll   PCP: Carylon Perches, MD   Recommendations at discharge:  Repeat basic metabolic panel in 1 week to follow electrolytes and renal function Repeat CBC in 1 week to follow hemoglobin trend/stability  Discharge Diagnoses: Principal Problem:   Rhabdomyolysis Active Problems:   Hypoxia   COVID   Failure to thrive in adult  Brief hospital admission course: Michael [name] is a 82 y.o. 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 At baseline, he live at home with her wife and have been completely dependent for their ADLs since their recent discharge on 12/14.  However, prior to that hospitalization, he was independent and ambulated without any support and was independent for ADLs.   Patient demonstrating acute dehydration, mild uremia, worsening confusion and significant deconditioning and decline.  Per family significant complaints of back pain, no eating, not drinking and unable to perform his activities as previously independently.  Assessment and Plan: 1-acute metabolic encephalopathy -Appears to be secondary to dehydration and mild uremia -Continue fluid resuscitation and follow clinical response. -Continue constant reorientation and minimize sedative agents. -Continue supportive care.   2-COVID infection -Transient use of oxygen supplementation at time of admission -At time of examination demonstrating adequate saturation on room air -Continue the use of mucolytic's, antitussives, as needed nebulizer and will to short course of low-dose prednisone -Will continue supportive care and follow clinical response.   3-back pain -Patient with multiple lumbar fusions in the past -Recent images  demonstrating a stable hardware -Per family after recent fall complaining of significant pain -Continue as needed analgesics using Tylenol and continue  low dose prednisone for 4 more days.   4-acute kidney injury -Appears to be prerenal in nature -Continue to minimize the use of nephrotoxic agents and continue fluid resuscitation -Continue to follow renal function trend intermittently. -Renal function improving back to normal at discharge after receiving fluid resuscitation.   5-physical deconditioning -Patient has been evaluated by physical therapy with recommendations for skilled nursing facility at time of discharge to pursued rehabilitation and conditioning. -TOC aware and helping with placement.   6-history of coronary artery disease/hyperlipidemia -No chest pain -Continue risk factor modification and home cardiac therapy.   7-history of dementia -Continue constant reorientation and supportive care -Continue treatment with Aricept and Namenda.   Consultants: None Procedures performed: See below for x-ray reports. Disposition: Skilled nursing facility Diet recommendation: Regular diet.  Feeding supplements also recommended.  DISCHARGE MEDICATION: Allergies as of 10/01/2023   No Known Allergies      Medication List     TAKE these medications    acetaminophen 325 MG tablet Commonly known as: TYLENOL Take 2 tablets (650 mg total) by mouth every 6 (six) hours as needed for mild pain (pain score 1-3) (or Fever >/= 100.4).   albuterol 108 (90 Base) MCG/ACT inhaler Commonly known as: VENTOLIN HFA Inhale into the lungs every 6 (six) hours as needed for wheezing or shortness of breath.   bisacodyl 5 MG EC tablet Commonly known as: DULCOLAX Take 1 tablet (5 mg total) by mouth daily as needed for moderate constipation.   cyanocobalamin 500 MCG tablet Commonly known as: VITAMIN B12 Take 1 tablet (500 mcg total) by mouth daily.   dextromethorphan-guaiFENesin 30-600 MG  12hr tablet Commonly known as: MUCINEX DM Take 1 tablet by mouth 2 (two) times daily for 8 days.   donepezil 10 MG tablet Commonly known as: ARICEPT Take 10 mg by mouth at bedtime.   levocetirizine 5 MG tablet Commonly known as: XYZAL Take 5 mg by mouth every evening.   memantine 10 MG tablet Commonly known as: NAMENDA Take 1 tablet (10 mg total) by mouth 2 (two) times daily.   pantoprazole 40 MG tablet Commonly known as: PROTONIX Take 1 tablet (40 mg total) by mouth daily. Start taking on: October 02, 2023   polyethylene glycol 17 g packet Commonly known as: MIRALAX / GLYCOLAX Take 17 g by mouth daily as needed for mild constipation.   predniSONE 20 MG tablet Commonly known as: DELTASONE Take 1 tablet (20 mg total) by mouth daily with breakfast for 4 days. Start taking on: October 02, 2023        Contact information for follow-up providers     Carylon Perches, MD. Schedule an appointment as soon as possible for a visit in 10 day(s).   Specialty: Internal Medicine Why: After discharge from the skilled nursing facility. Contact information: [address] [place] [address] [phone]              Contact information for after-discharge care     Destination     [facility] .   Service: Skilled Nursing Contact information: [address] [place] [address] [phone]                    Discharge Exam: Ceasar Mons Weights   09/27/23 1540 09/27/23 2000  Weight: 64 kg 60 kg   General exam: Alert, awake and able to follow commands.  Poor insight appreciated on exam.  No fever, no chest pain, no nausea, no vomiting. Respiratory system: Good saturation on room air; positive rhonchi appreciated bilaterally. Cardiovascular system:RRR. No rubs or gallops.  No JVD. Gastrointestinal system: Abdomen is nondistended, soft and nontender. No organomegaly or masses felt. Normal bowel sounds  heard. Central nervous system: Moving 4 limbs spontaneously.  No focal neurological deficits. Extremities: No cyanosis or clubbing. Skin: No petechiae. Psychiatry: Judgement and insight appear impaired secondary to dementia.  Condition at discharge: stable  The results of significant diagnostics from this hospitalization (including imaging, microbiology, ancillary and laboratory) are listed below for reference.   Imaging Studies: DG Chest Port 1 View Result Date: 09/27/2023 CLINICAL DATA:  Failure to thrive. EXAM: PORTABLE CHEST 1 VIEW COMPARISON:  09/25/2023 FINDINGS: The cardiac silhouette, mediastinal and hilar contours are normal in stable. Stable mild tortuosity and calcification of the thoracic aorta. No acute pulmonary findings. No pulmonary lesions, pleural effusion or pneumothorax. The bony thorax is intact.  Stable total right shoulder hardware. IMPRESSION: No acute cardiopulmonary findings. Electronically Signed   By: Rudie Meyer M.D.   On: 09/27/2023 17:11   DG Chest Port 1 View Result Date: 09/25/2023 CLINICAL DATA:  [id number] Dyspnea [id number] COVID-19 [id number] EXAM: PORTABLE CHEST 1 VIEW COMPARISON:  04/08/2016 FINDINGS: The heart size and mediastinal contours are within normal limits. Aortic atherosclerosis. Mild streaky bibasilar opacities. Lungs are otherwise clear. No pleural effusion or pneumothorax. The visualized skeletal structures are unremarkable. IMPRESSION: Mild streaky bibasilar opacities, atelectasis versus infiltrate. Electronically Signed   By: Duanne Guess D.O.   On: 09/25/2023 12:54   MR BRAIN WO CONTRAST Result Date: 09/24/2023 CLINICAL DATA:  Initial evaluation for acute mental status change,  unknown cause. EXAM: MRI HEAD WITHOUT CONTRAST TECHNIQUE: Multiplanar, multiecho pulse sequences of the brain and surrounding structures were obtained without intravenous contrast. COMPARISON:  Prior CT from 09/23/2023. FINDINGS: Brain: Moderately advanced  age-related cerebral atrophy. Patchy T2/FLAIR hyperintensity involving the periventricular and deep white matter both cerebral hemispheres, consistent with chronic small vessel ischemic disease, mild to moderate in nature. No evidence for acute or subacute ischemia. Gray-white matter differentiation maintained. No areas of chronic cortical infarction. No acute intracranial hemorrhage. Few small foci of chronic hemosiderin staining noted at the left occipital lobe. No other chronic intracranial blood products. No mass lesion, midline shift or mass effect. Ventricular prominence related to global parenchymal volume loss without hydrocephalus. No extra-axial fluid collection. Pituitary gland suprasellar region within normal limits. Vascular: Major intracranial vascular flow voids are maintained. Skull and upper cervical spine: Craniocervical junction within normal limits. Bone marrow signal intensity within normal limits. Degenerative spondylosis noted at the partially visualized C4-5 interspace. No scalp soft tissue abnormality. Sinuses/Orbits: Prior bilateral ocular lens replacement with postoperative changes at the left lobe. Paranasal sinuses are largely clear. Trace right mastoid effusion, of doubtful significance. Other: None. IMPRESSION: 1. No acute intracranial abnormality. 2. Moderately advanced age-related cerebral atrophy with mild to moderate chronic microvascular ischemic disease. Electronically Signed   By: Rise Mu M.D.   On: 09/24/2023 19:29   EEG adult Result Date: 09/23/2023 Charlsie Quest, MD     09/23/2023  9:49 PM Patient Name: Michael [name] MRN: [id number] Epilepsy Attending: Charlsie Quest Referring Physician/Provider: Catarina Hartshorn, MD Date: 09/23/2023 Duration: 23.34 mins Patient history: 82yo M with ams getting eeg to evaluate for seizure Level of alertness: Awake, asleep AEDs during EEG study: None Technical aspects: This EEG study was done with scalp electrodes positioned  according to the 10-20 International system of electrode placement. Electrical activity was reviewed with band pass filter of 1-70Hz , sensitivity of 7 uV/mm, display speed of 79mm/sec with a 60Hz  notched filter applied as appropriate. EEG data were recorded continuously and digitally stored.  Video monitoring was available and reviewed as appropriate. Description: The posterior dominant rhythm consists of 7.5 Hz activity of moderate voltage (25-35 uV) seen predominantly in posterior head regions, symmetric and reactive to eye opening and eye closing. Sleep was characterized by vertex waves, sleep spindles (12 to 14 Hz), maximal frontocentral region. EEG showed intermittent generalized polymorphic sharply contoured 3 to 6 Hz theta-delta slowing. Hyperventilation and photic stimulation were not performed.   ABNORMALITY - Intermittent slow, generalized IMPRESSION: This study is suggestive of mild diffuse encephalopathy. No seizures or epileptiform discharges were seen throughout the recording. Priyanka Annabelle Harman   DG HIP UNILAT WITH PELVIS 2-3 VIEWS LEFT Result Date: 09/23/2023 CLINICAL DATA:  Left hip pain EXAM: DG HIP (WITH OR WITHOUT PELVIS) 2-3V LEFT COMPARISON:  Same day CT FINDINGS: There is no evidence of hip fracture or dislocation. Mild degenerative changes of both hips. Partially imaged lumbosacral fusion hardware. Mild perihardware lucency associated with the bilateral S1 screws. IMPRESSION: 1. No acute fracture or dislocation of the left hip. 2. Mild degenerative changes of both hips. 3. Partially imaged lumbosacral fusion hardware with mild perihardware lucency associated with the bilateral S1 screws, compatible with loosening. Electronically Signed   By: Duanne Guess D.O.   On: 09/23/2023 16:04   CT Head Wo Contrast Result Date: 09/23/2023 CLINICAL DATA:  Provided history: Mental status change, unknown cause. Recent fall. EXAM: CT HEAD WITHOUT CONTRAST TECHNIQUE: Contiguous axial images were  obtained from  the base of the skull through the vertex without intravenous contrast. RADIATION DOSE REDUCTION: This exam was performed according to the departmental dose-optimization program which includes automated exposure control, adjustment of the mA and/or kV according to patient size and/or use of iterative reconstruction technique. COMPARISON:  Head CT 09/17/2023.  Brain MRI 02/14/2013. FINDINGS: Brain: Generalized cerebral atrophy. Commensurate prominence of the ventricles and sulci. Patchy and ill-defined hypoattenuation within the cerebral white matter, nonspecific but compatible with mild chronic small vessel ischemic disease. There is no acute intracranial hemorrhage. No demarcated cortical infarct. No extra-axial fluid collection. No evidence of an intracranial mass. No midline shift. Vascular: No hyperdense vessel.  Atherosclerotic calcifications. Skull: No calvarial fracture or aggressive osseous lesion. Sinuses/Orbits: Chronic hyperdensity of the left globe, which may reflect sequela of prior vitreous hemorrhage and/or post-procedural changes. No acute orbital finding. No significant paranasal sinus disease. IMPRESSION: 1. No evidence of an acute intracranial abnormality. 2. Parenchymal atrophy and chronic small vessel ischemic disease. Electronically Signed   By: Jackey Loge D.O.   On: 09/23/2023 13:07   CT Renal Stone Study Result Date: 09/23/2023 CLINICAL DATA:  Abdominal/flank pain, stone suspected. EXAM: CT ABDOMEN AND PELVIS WITHOUT CONTRAST TECHNIQUE: Multidetector CT imaging of the abdomen and pelvis was performed following the standard protocol without IV contrast. RADIATION DOSE REDUCTION: This exam was performed according to the departmental dose-optimization program which includes automated exposure control, adjustment of the mA and/or kV according to patient size and/or use of iterative reconstruction technique. COMPARISON:  None Available. FINDINGS: Lower chest: No acute findings.   Coronary artery calcifications. Hepatobiliary: Benign hepatic cysts, measuring up to 1.6 cm in the left hepatic lobe (no imaging follow-up is recommended). No suspicious liver masses. Cholelithiasis. No gallbladder wall thickening or surrounding inflammation to suggest acute cholecystitis. No biliary dilatation. Pancreas: Unremarkable. No pancreatic ductal dilatation or surrounding inflammatory changes. Spleen: Normal in size without focal abnormality. Adrenals/Urinary Tract: Adrenal glands are unremarkable. Kidneys are normal, without renal calculi, focal lesion, or hydronephrosis. Bladder is unremarkable. Stomach/Bowel: Normal stomach and duodenum. No dilated loops of small bowel. Normal appendix is visualized on axial image 57 series 3. Sigmoid diverticulosis. No bowel wall thickening or surrounding inflammation. Vascular/Lymphatic: Aortic atherosclerosis. No enlarged abdominal or pelvic lymph nodes. Reproductive: Prostate is unremarkable. Other: No abdominal wall hernia or abnormality. No abdominopelvic ascites. Musculoskeletal: No acute or suspicious bone findings. Please refer to same-day lumbar spine CT report for description of lumbar spine findings. IMPRESSION: 1. No acute findings in the abdomen or pelvis. Specifically, no evidence of obstructive uropathy. 2. Cholelithiasis without evidence of acute cholecystitis. 3. Sigmoid diverticulosis without evidence of acute diverticulitis. Aortic Atherosclerosis (ICD10-I70.0). Electronically Signed   By: Orvan Falconer M.D.   On: 09/23/2023 11:12   CT L-SPINE NO CHARGE Result Date: 09/23/2023 CLINICAL DATA:  Back pain. Fall 2 weeks ago with persistent lower backache. EXAM: CT LUMBAR SPINE WITHOUT CONTRAST TECHNIQUE: Multidetector CT imaging of the lumbar spine was performed without intravenous contrast administration. Multiplanar CT image reconstructions were also generated. RADIATION DOSE REDUCTION: This exam was performed according to the departmental  dose-optimization program which includes automated exposure control, adjustment of the mA and/or kV according to patient size and/or use of iterative reconstruction technique. COMPARISON:  MRI lumbar spine 08/11/2012. FINDINGS: Segmentation: Conventional numbering is assumed with 5 non-rib-bearing, lumbar type vertebral bodies. Alignment: No listhesis. Vertebrae: Interval postoperative changes of L1-S1 interbody and posterior spinal fusion. Solid bony fusion across the intervening disc spaces from L1-L5. Lucency surrounding the bilateral  S1 pedicle screws with nonunion at L5-S1, consistent with loosening. Normal vertebral body heights. No acute fracture. Degenerative endplate changes at T11-12. Paraspinal and other soft tissues: Please refer to same-day abdominal CT report for retroperitoneal findings. Disc levels: No high-grade spinal canal stenosis. Persistent moderate to severe bilateral neural foraminal narrowing at L5-S1. IMPRESSION: 1. No acute fracture or traumatic listhesis of the lumbar spine. 2. Interval postoperative changes of L1-S1 interbody and posterior spinal fusion. Solid bony fusion across the intervening disc spaces from L1-L5. 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 3. Persistent moderate to severe bilateral neural foraminal narrowing at L5-S1. Electronically Signed   By: Orvan Falconer M.D.   On: 09/23/2023 11:05   CT Maxillofacial Wo Contrast Result Date: 09/17/2023 CLINICAL DATA:  Fall while walking dog, laceration to bilateral eyebrows EXAM: CT HEAD WITHOUT CONTRAST CT MAXILLOFACIAL WITHOUT CONTRAST CT CERVICAL SPINE WITHOUT CONTRAST TECHNIQUE: Multidetector CT imaging of the head, cervical spine, and maxillofacial structures were performed using the standard protocol without intravenous contrast. Multiplanar CT image reconstructions of the cervical spine and maxillofacial structures were also generated. RADIATION DOSE  REDUCTION: This exam was performed according to the departmental dose-optimization program which includes automated exposure control, adjustment of the mA and/or kV according to patient size and/or use of iterative reconstruction technique. COMPARISON:  No prior CT head, face, or cervical spine, correlation is made with MRI head 02/14/2013 FINDINGS: CT HEAD FINDINGS Brain: No evidence of acute infarct, hemorrhage, mass, mass effect, or midline shift. No hydrocephalus or extra-axial fluid collection. Age related cerebral volume loss. Mildly more prominent atrophy in the right temporal lobe, with ex vacuo dilatation of the right temporal horn. Vascular: No hyperdense vessel. Atherosclerotic calcifications in the intracranial carotid and vertebral arteries. Skull: Negative for fracture or focal lesion. CT MAXILLOFACIAL FINDINGS Osseous: Possible nasal bone deformity (series 3, images 32-34), although these may be chronic. The nasal septum and anterior nasal spine are intact. No evidence of additional facial bone fracture. No mandibular dislocation. No destructive process. Orbits: No traumatic or inflammatory finding. Sinuses: Mucosal thickening in the right maxillary sinus. Otherwise clear paranasal sinuses. The mastoids are well aerated. Soft tissues: Right periorbital edema. Right supraorbital laceration. CT CERVICAL SPINE FINDINGS Alignment: No traumatic listhesis. Trace retrolisthesis of C3 on C4 and mild retrolisthesis of C4 on C5 appears facet mediated and chronic. Skull base and vertebrae: No acute fracture. No primary bone lesion or focal pathologic process. Soft tissues and spinal canal: No prevertebral fluid or swelling. No visible canal hematoma. Disc levels: Degenerative disc height loss, most prominently at C3-C4 and C4-C5. 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 Upper chest: No focal pulmonary opacity or pleural effusion. Other: None. IMPRESSION: 1. No acute intracranial process. 2. Possible  nasal bone deformity, although these may be chronic. Correlate with point tenderness. 3. Right periorbital edema and right supraorbital laceration. 4. No acute fracture or traumatic listhesis in the cervical spine. Electronically Signed   By: Wiliam Ke M.D.   On: 09/17/2023 17:42   CT Head Wo Contrast Result Date: 09/17/2023 CLINICAL DATA:  Fall while walking dog, laceration to bilateral eyebrows EXAM: CT HEAD WITHOUT CONTRAST CT MAXILLOFACIAL WITHOUT CONTRAST CT CERVICAL SPINE WITHOUT CONTRAST TECHNIQUE: Multidetector CT imaging of the head, cervical spine, and maxillofacial structures were performed using the standard protocol without intravenous contrast. Multiplanar CT image reconstructions of the cervical spine and maxillofacial structures were also generated. RADIATION DOSE REDUCTION: This exam was performed according  to the departmental dose-optimization program which includes automated exposure control, adjustment of the mA and/or kV according to patient size and/or use of iterative reconstruction technique. COMPARISON:  No prior CT head, face, or cervical spine, correlation is made with MRI head 02/14/2013 FINDINGS: CT HEAD FINDINGS Brain: No evidence of acute infarct, hemorrhage, mass, mass effect, or midline shift. No hydrocephalus or extra-axial fluid collection. Age related cerebral volume loss. Mildly more prominent atrophy in the right temporal lobe, with ex vacuo dilatation of the right temporal horn. Vascular: No hyperdense vessel. Atherosclerotic calcifications in the intracranial carotid and vertebral arteries. Skull: Negative for fracture or focal lesion. CT MAXILLOFACIAL FINDINGS Osseous: Possible nasal bone deformity (series 3, images 32-34), although these may be chronic. The nasal septum and anterior nasal spine are intact. No evidence of additional facial bone fracture. No mandibular dislocation. No destructive process. Orbits: No traumatic or inflammatory finding. Sinuses: Mucosal  thickening in the right maxillary sinus. Otherwise clear paranasal sinuses. The mastoids are well aerated. Soft tissues: Right periorbital edema. Right supraorbital laceration. CT CERVICAL SPINE FINDINGS Alignment: No traumatic listhesis. Trace retrolisthesis of C3 on C4 and mild retrolisthesis of C4 on C5 appears facet mediated and chronic. Skull base and vertebrae: No acute fracture. No primary bone lesion or focal pathologic process. Soft tissues and spinal canal: No prevertebral fluid or swelling. No visible canal hematoma. Disc levels: Degenerative disc height loss, most prominently at C3-C4 and C4-C5. 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 Upper chest: No focal pulmonary opacity or pleural effusion. Other: None. IMPRESSION: 1. No acute intracranial process. 2. 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 Correlate with point tenderness. 3. Right periorbital edema and right supraorbital laceration. 4. No acute fracture or traumatic listhesis in the cervical spine. Electronically Signed   By: Wiliam Ke M.D.   On: 09/17/2023 17:42   CT Cervical Spine Wo Contrast Result Date: 09/17/2023 CLINICAL DATA:  Fall while walking dog, laceration to bilateral eyebrows EXAM: CT HEAD WITHOUT CONTRAST CT MAXILLOFACIAL WITHOUT CONTRAST CT CERVICAL SPINE WITHOUT CONTRAST TECHNIQUE: Multidetector CT imaging of the head, cervical spine, and maxillofacial structures were performed using the standard protocol without intravenous contrast. Multiplanar CT image reconstructions of the cervical spine and maxillofacial structures were also generated. RADIATION DOSE REDUCTION: This exam was performed according to the departmental dose-optimization program which includes automated exposure control, adjustment of the mA and/or kV according to patient size and/or use of iterative reconstruction technique. COMPARISON:  No prior CT head, face, or cervical spine, correlation is made with MRI head 02/14/2013  FINDINGS: CT HEAD FINDINGS Brain: No evidence of acute infarct, hemorrhage, mass, mass effect, or midline shift. No hydrocephalus or extra-axial fluid collection. Age related cerebral volume loss. Mildly more prominent atrophy in the right temporal lobe, with ex vacuo dilatation of the right temporal horn. Vascular: No hyperdense vessel. Atherosclerotic calcifications in the intracranial carotid and vertebral arteries. Skull: Negative for fracture or focal lesion. CT MAXILLOFACIAL FINDINGS Osseous: Possible nasal bone deformity (series 3, images 32-34), although these may be chronic. The nasal septum and anterior nasal spine are intact. No evidence of additional facial bone fracture. No mandibular dislocation. No destructive process. Orbits: No traumatic or inflammatory finding. Sinuses: Mucosal thickening in the right maxillary sinus. Otherwise clear paranasal sinuses. The mastoids are well aerated. Soft tissues: Right periorbital edema. Right supraorbital laceration. CT CERVICAL SPINE FINDINGS Alignment: No traumatic listhesis. Trace retrolisthesis of C3 on C4 and mild retrolisthesis of C4 on C5 appears facet  mediated and chronic. Skull base and vertebrae: No acute fracture. No primary bone lesion or focal pathologic process. Soft tissues and spinal canal: No prevertebral fluid or swelling. No visible canal hematoma. Disc levels: Degenerative disc height loss, most prominently at C3-C4 and C4-C5. 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 Upper chest: No focal pulmonary opacity or pleural effusion. Other: None. IMPRESSION: 1. No acute intracranial process. 2. 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 Correlate with point tenderness. 3. Right periorbital edema and right supraorbital laceration. 4. No acute fracture or traumatic listhesis in the cervical spine. Electronically Signed   By: Wiliam Ke M.D.   On: 09/17/2023 17:42    Microbiology: Results for orders placed or performed  during the hospital encounter of 09/27/23  Culture, blood (Routine X 2) w Reflex to ID Panel     Status: None (Preliminary result)   Collection Time: 09/27/23  4:03 PM   Specimen: Left Antecubital; Blood  Result Value Ref Range Status   Specimen Description LEFT ANTECUBITAL  Final   Special Requests   Final    BOTTLES DRAWN AEROBIC AND ANAEROBIC Blood Culture adequate volume   Culture   Final    NO GROWTH 4 DAYS Performed at [facility], [address], [place], [place] [address]    Report Status PENDING  Incomplete  Culture, blood (Routine X 2) w Reflex to ID Panel     Status: None (Preliminary result)   Collection Time: 09/27/23  4:05 PM   Specimen: Right Antecubital; Blood  Result Value Ref Range Status   Specimen Description   Final    RIGHT ANTECUBITAL BOTTLES DRAWN AEROBIC AND ANAEROBIC   Special Requests Blood Culture adequate volume  Final   Culture   Final    NO GROWTH 4 DAYS Performed at [facility], [address] [place], [place] [address]    Report Status PENDING  Incomplete    Labs: CBC: Recent Labs  Lab 09/25/23 1233 09/27/23 1603 09/29/23 0413  WBC 5.6 9.5 4.1  NEUTROABS  --  8.6*  --   HGB 12.3* 12.8* 10.4*  HCT 38.2* 39.9 32.1*  MCV 90.1 88.9 90.4  PLT 165 193 167   Basic Metabolic Panel: Recent Labs  Lab 09/25/23 0417 09/27/23 1603 09/29/23 0413 09/30/23 0527  NA 137 145 141 139  K 3.7 3.5 3.0* 3.3*  CL 106 107 113* 112*  CO2 23 26 21* 20*  GLUCOSE 88 105* 84 80  BUN 30* 39* 43* 41*  CREATININE 1.14 1.39* 1.23 1.17  CALCIUM 8.4* 9.4 7.7* 7.8*  MG 1.8  --   --   --    Liver Function Tests: Recent Labs  Lab 09/27/23 1603  AST 95*  ALT 24  ALKPHOS 46  BILITOT 1.0  PROT 6.2*  ALBUMIN 3.0*   CBG: No results for input(s): "GLUCAP" in the last 168 hours.  Discharge time spent: greater than 30 minutes.  Signed: Vassie Loll, MD [facility] 10/01/2023

## 2023-10-01 NOTE — TOC Transition Note (Signed)
Transition of Care [facility]) - Discharge Note   Patient Details  Name: Michael [name] MRN: [id number] Date of Birth: 09-Mar-1941  Transition of Care [facility]) CM/SW Contact:  Leitha Bleak, RN Phone Number: 10/01/2023, 2:55 PM   Clinical Narrative:   Berkley Harvey received, Kerri provided room number, RN calling report. CM called his wife to update her with the discharge plan.     Final next level of care: Skilled Nursing Facility Barriers to Discharge: Barriers Resolved   Patient Goals and CMS Choice Patient states their goals for this hospitalization and ongoing recovery are:: rehab CMS Medicare.gov Compare Post Acute Care list provided to:: Patient Represenative (must comment) Choice offered to / list presented to : Spouse [place] ownership interest in [facility].provided to:: Spouse    Discharge Placement                 Name of family member notified: Wife Patient and family notified of of transfer: 10/01/23  Discharge Plan and Services Additional resources added to the After Visit Summary for   In-house Referral: Clinical Social Work   Post Acute Care Choice: Skilled Nursing Facility                Social Drivers of Health (SDOH) Interventions SDOH Screenings   Food Insecurity: No Food Insecurity (09/28/2023)  Housing: Low Risk  (09/28/2023)  Transportation Needs: No Transportation Needs (09/28/2023)  Utilities: Not At Risk (09/28/2023)  Alcohol Screen: Low Risk  (09/27/2023)  Depression (PHQ2-9): Low Risk  (09/27/2023)  Financial Resource Strain: Low Risk  (09/27/2023)  Physical Activity: Sufficiently Active (09/27/2023)  Recent Concern: Physical Activity - Inactive (09/27/2023)  Social Connections: Moderately Integrated (09/27/2023)  Stress: No Stress Concern Present (09/27/2023)  Tobacco Use: Medium Risk (09/27/2023)  Health Literacy: Inadequate Health Literacy (09/27/2023)    Readmission Risk Interventions    10/01/2023    2:52 PM  Readmission  Risk Prevention Plan  Transportation Screening Complete  PCP or Specialist Appt within 5-7 Days Complete  Home Care Screening Complete  Medication Review (RN CM) Complete

## 2023-10-01 NOTE — Plan of Care (Signed)
  Problem: Education: Goal: Knowledge of risk factors and measures for prevention of condition will improve Outcome: Progressing   Problem: Clinical Measurements: Goal: Will remain free from infection Outcome: Progressing Goal: Respiratory complications will improve Outcome: Progressing   Problem: Safety: Goal: Ability to remain free from injury will improve Outcome: Progressing

## 2023-10-01 NOTE — Progress Notes (Signed)
Patient alert and oriented to self and place this shift. Patient was able to keep the mitts off for the entire shift. IV intact. Patient attempted several times to get out of the bed. Patient redirected and repositioned. Patient had two BMs this shift.

## 2023-10-01 NOTE — Care Management Important Message (Signed)
Important Message  Patient Details  Name: Michael [name] MRN: [id number] Date of Birth: 10-Sep-1941   Important Message Given:  Yes - Medicare IM (spoke with [name] by phone at [phone] to review letter, no additional copy needed)     Corey Harold 10/01/2023, 11:21 AM

## 2023-10-02 LAB — CULTURE, BLOOD (ROUTINE X 2)
Culture: NO GROWTH
Culture: NO GROWTH
Special Requests: ADEQUATE
Special Requests: ADEQUATE

## 2023-10-04 ENCOUNTER — Non-Acute Institutional Stay (SKILLED_NURSING_FACILITY): Payer: Medicare PPO | Admitting: Adult Health

## 2023-10-04 ENCOUNTER — Encounter: Payer: Self-pay | Admitting: Adult Health

## 2023-10-04 ENCOUNTER — Ambulatory Visit: Payer: Self-pay | Admitting: *Deleted

## 2023-10-04 ENCOUNTER — Other Ambulatory Visit (HOSPITAL_COMMUNITY)
Admission: RE | Admit: 2023-10-04 | Discharge: 2023-10-04 | Disposition: A | Discharge status: Home / Self Care | Payer: Medicare PPO | Source: Skilled Nursing Facility | Attending: Adult Health | Admitting: Adult Health

## 2023-10-04 DIAGNOSIS — E876 Hypokalemia: Secondary | ICD-10-CM

## 2023-10-04 DIAGNOSIS — M6282 Rhabdomyolysis: Secondary | ICD-10-CM | POA: Diagnosis not present

## 2023-10-04 DIAGNOSIS — M47817 Spondylosis without myelopathy or radiculopathy, lumbosacral region: Secondary | ICD-10-CM | POA: Diagnosis not present

## 2023-10-04 DIAGNOSIS — K5909 Other constipation: Secondary | ICD-10-CM

## 2023-10-04 DIAGNOSIS — Z87891 Personal history of nicotine dependence: Secondary | ICD-10-CM

## 2023-10-04 DIAGNOSIS — G9341 Metabolic encephalopathy: Secondary | ICD-10-CM

## 2023-10-04 DIAGNOSIS — F0390 Unspecified dementia without behavioral disturbance: Secondary | ICD-10-CM

## 2023-10-04 DIAGNOSIS — R627 Adult failure to thrive: Secondary | ICD-10-CM | POA: Diagnosis not present

## 2023-10-04 DIAGNOSIS — I700 Atherosclerosis of aorta: Secondary | ICD-10-CM | POA: Diagnosis not present

## 2023-10-04 DIAGNOSIS — E46 Unspecified protein-calorie malnutrition: Secondary | ICD-10-CM | POA: Insufficient documentation

## 2023-10-04 DIAGNOSIS — U071 COVID-19: Secondary | ICD-10-CM

## 2023-10-04 DIAGNOSIS — N1832 Chronic kidney disease, stage 3b: Secondary | ICD-10-CM | POA: Diagnosis not present

## 2023-10-04 DIAGNOSIS — E440 Moderate protein-calorie malnutrition: Secondary | ICD-10-CM

## 2023-10-04 DIAGNOSIS — F02B0 Dementia in other diseases classified elsewhere, moderate, without behavioral disturbance, psychotic disturbance, mood disturbance, and anxiety: Secondary | ICD-10-CM

## 2023-10-04 DIAGNOSIS — I10 Essential (primary) hypertension: Secondary | ICD-10-CM

## 2023-10-04 DIAGNOSIS — K219 Gastro-esophageal reflux disease without esophagitis: Secondary | ICD-10-CM

## 2023-10-04 DIAGNOSIS — J3089 Other allergic rhinitis: Secondary | ICD-10-CM

## 2023-10-04 LAB — BASIC METABOLIC PANEL WITH GFR
Anion gap: 9 (ref 5–15)
BUN: 23 mg/dL (ref 8–23)
CO2: 27 mmol/L (ref 22–32)
Calcium: 8.1 mg/dL — ABNORMAL LOW (ref 8.9–10.3)
Chloride: 106 mmol/L (ref 98–111)
Creatinine, Ser: 0.97 mg/dL (ref 0.61–1.24)
GFR, Estimated: >60 mL/min
Glucose, Bld: 91 mg/dL (ref 70–99)
Potassium: 3.3 mmol/L — ABNORMAL LOW (ref 3.5–5.1)
Sodium: 142 mmol/L (ref 135–145)

## 2023-10-04 LAB — CBC
HCT: 35.7 % — ABNORMAL LOW (ref 39.0–52.0)
Hemoglobin: 11.8 g/dL — ABNORMAL LOW (ref 13.0–17.0)
MCH: 29.1 pg (ref 26.0–34.0)
MCHC: 33.1 g/dL (ref 30.0–36.0)
MCV: 87.9 fL (ref 80.0–100.0)
Platelets: 286 10*3/uL (ref 150–400)
RBC: 4.06 MIL/uL — ABNORMAL LOW (ref 4.22–5.81)
RDW: 13.7 % (ref 11.5–15.5)
WBC: 6.4 10*3/uL (ref 4.0–10.5)
nRBC: 0 % (ref 0.0–0.2)

## 2023-10-04 NOTE — Patient Outreach (Addendum)
Care Coordination   Follow Up Visit Note   10/04/2023  Name: Michael [name] MRN: [id number] DOB: Jul 16, 1941  Michael [name] is a 82 y.o. year old male who sees Michael Perches, MD for primary care. I spoke with patient's wife, Michael [name] by phone today.  What matters to the patients health and wellness today?  Receive Assistance with Higher Level of Care Placement.    Goals Addressed             This Visit's Progress    Receive Assistance with Higher Level of Care Placement.   On track    Care Coordination Interventions:  Interventions Today    Flowsheet Row Most Recent Value  Chronic Disease   Chronic disease during today's visit Other, Chronic Kidney Disease/End Stage Renal Disease (ESRD)  [Major Neurocognitive Disorder, Dementia, Acute Metabolic Encephalopathy, Frequent Falls, Recent Fall w/ Injury, Unsteady Balance/Gait, Inability to Perform Activities of Daily Living Independently, Caregiver Fatigue, Higher Level of Care Placement Needed]  General Interventions   General Interventions Discussed/Reviewed General Interventions Discussed, Labs, Vaccines, Doctor Visits, Health Screening, Annual Foot Exam, General Interventions Reviewed, Communication with, Michael [name], Level of Care, Horticulturist, commercial (DME), Annual Eye Exam  [Encouraged Routine Engagement & Communication with Care Team Members.]  Labs Hgb A1c every 3 months, Kidney Function  [Encouraged Routine Labs.]  Vaccines COVID-19, Flu, Pneumonia, RSV, Shingles, Tetanus/Pertussis/Diphtheria  [Encouraged Annual Vaccinations.]  Doctor Visits Discussed/Reviewed Doctor Visits Discussed, Specialist, Doctor Visits Reviewed, Annual Wellness Visits, PCP  [Encouraged Routine Engagement & Communication with Care Team Members.]  Health Screening Bone Density, Colonoscopy, Prostate  [Encouraged Annual Health Screenings.]  Durable Medical Equipment (DME) BP Cuff, Other, Michael [name], Prescription Eyeglasses.]   PCP/Specialist Visits Compliance with follow-up visit  [Encouraged Routine Engagement & Communication with Care Team Members.]  Communication with PCP/Specialists, RN, Pharmacists, Social Work  [employer] Routine Engagement & Communication with Care Team Members.]  Level of Care Adult Daycare, Air traffic controller, Assisted Living, Skilled Nursing Facility  [Confirmed Disinterest in Enrollment in Adult Day Care Program. Confirmed Interest in Pursusing Higher Level of Care Placement Options (I.e Skilled Nursing for Short-Term Rehabilitative Services Versus Long-Term Memory Care Assisted Living).]  Applications Medicaid, Personal Care Services  [Confirmed Disinterest in Applying for Medicaid or Special Assistance Long-Term Care Medicaid Due to Combined Income Exceeding Poverty Guidelines for State of [place] for Fiscal Year 2024/2025. Confirmed Ineligibility to Apply for Personal Care Services.]  Exercise Interventions   Exercise Discussed/Reviewed Exercise Discussed, Assistive device use and maintanence, Exercise Reviewed, Physical Activity, Weight Managment  [Patient Exercised 7 Days a Week for 30 Minutes Per Day Walking Dog with Wife Prior to Recent Fall.]  Physical Activity Discussed/Reviewed Physical Activity Discussed, Home Exercise Program (HEP), Physical Activity Reviewed, PREP, Gym, Types of exercise  [Confirmed with Wife Patient's Inability to Ambulate or Perform Activities of Daily Living Independently Since Recent Fall.]  Weight Management Weight maintenance  [Encouraged Supplemental Nutrition & Hydration.]  Education Interventions   Education Provided Provided Therapist, sports, Provided Web-based Education, Provided Education  [Encouraged Review, Consideration & Implementation.]  Provided Verbal Education On Nutrition, Foot Care, Eye Care, Labs, Mental Health/Coping with Illness, When to see the doctor, Michael [name], [employer], Medication, Exercise, Blood Sugar Monitoring, Applications   [Encouraged Heart-Healthy, Low Fat, Low Sodium, Supplemental Diet.]  Labs Reviewed Hgb A1c  Applications Medicaid, Personal Care Services  [Confirmed Disinterest in Applying for Medicaid or Special Assistance Long-Term Care Medicaid Due to Combined Income Exceeding Poverty Guidelines for [place] for  Fiscal Year 2024/2025. Confirmed Ineligibility to Apply for Personal Care Services.]  Mental Health Interventions   Mental Health Discussed/Reviewed Mental Health Discussed, Anxiety, Depression, Grief and Loss, Mental Health Reviewed, Substance Abuse, Coping Strategies, Crisis, Suicide, Other  [Assessed Mental Health & Cognitive Status of Patient & Caregiver, Wife.]  Nutrition Interventions   Nutrition Discussed/Reviewed Nutrition Discussed, Adding fruits and vegetables, Increasing proteins, Nutrition Reviewed, Decreasing fats, Decreasing salt, Fluid intake, Carbohydrate meal planning, Portion sizes, Decreasing sugar intake, Supplemental nutrition  [Confirmed Patient's Lack of Desire to Eat, Drink or Consume Prescription Medications.]  Pharmacy Interventions   Pharmacy Dicussed/Reviewed Pharmacy Topics Discussed, Medications and their functions, Pharmacy Topics Reviewed, Medication Adherence, Affording Medications  [Confirmed Ability to Afford Prescription Medications, But [name] Non-Compliance.]  Medication Adherence Not taking medication  [Confirmed with Wife Patient's Desire to Stop Taking Prescription Medications.]  Safety Interventions   Safety Discussed/Reviewed Safety Discussed, Safety Reviewed, Fall Risk, Home Safety  [Encouraged Routine Use of Home Safety Devices & Durable Medical Equipment. Encouraged Consideration of Home Safety Evaluation & Home Health Physical Therapy Services, as Recommended.]  Home Safety Assistive Devices, Need for home safety assessment, Refer for home visit, Refer for community resources, Contact provider for referral to PT/OT  [Agreed to Contact Primary Care Provider  to Request Order for Home Health Physical Therapy Services & Referral to [facility] Agency of Choice. Agreed to Contact Primary Care Provider to Request Review & Signature of FL-2 Form for Placement Purposes.]  Advanced Directive Interventions   Advanced Directives Discussed/Reviewed Advanced Directives Discussed, Advanced Directives Reviewed, Advanced Care Planning, End of Life  [Confirmed Initiation of Advanced Directives (Living Will & Healthcare Power of Attorney Documents) & Encouraged to Provide Hospital Staff or Primary Care Providers with Copies to Scan into Electronic Medical Record in Epic.]  End of Life Hospice, Palliative  [Encouraged Consideration of End-of-Life Care & Palliative Care Consult through [facility] Compassionate Care.]       Active Listening & Reflection Utilized. Verbalization of Feelings Encouraged. Emotional Support Provided. Problem Solving Interventions Implemented. Task-Centered Solutions Activated.   Solution-Focused Strategies Employed. Acceptance & Commitment Therapy Performed Cognitive Behavioral Therapy Indicated. Client-Centered Therapy Initiated. CSW Collaboration with Wife, Castle Dougall to Confirm Patient's [name] Residence at [facility] (# [phone]), to Receive Short-Term Rehabilitative Services.  Encouraged Routine Engagement with Danford Bad, Licensed Clinical Social Worker with [facility] [phone]), if You Have Questions, Need Assistance, or If Additional Social Work Needs Are Identified Between Now & Our Next Follow-Up Outreach Call, Scheduled on 10/22/2023 at 11:30 AM.      SDOH assessments and interventions completed:  Yes.  Care Coordination Interventions:  Yes, provided.   Follow up plan: Follow up call scheduled for 10/22/2023 at 11:30 AM.  Encounter Outcome:  Patient Visit Completed.   Danford Bad, BSW, MSW, Printmaker Social Work Case Set designer Health   [facility], Population Health Direct Dial: [phone]  Fax: [phone] Email: [email or web address]

## 2023-10-04 NOTE — Patient Instructions (Addendum)
Visit Information  Thank you for taking time to visit with me today. Please don't hesitate to contact me if I can be of assistance to you.   Following are the goals we discussed today:   Goals Addressed             This Visit's Progress    Receive Assistance with Higher Level of Care Placement.   On track    Care Coordination Interventions:  Interventions Today    Flowsheet Row Most Recent Value  Chronic Disease   Chronic disease during today's visit Other, Chronic Kidney Disease/End Stage Renal Disease (ESRD)  [Major Neurocognitive Disorder, Dementia, Acute Metabolic Encephalopathy, Frequent Falls, Recent Fall w/ Injury, Unsteady Balance/Gait, Inability to Perform Activities of Daily Living Independently, Caregiver Fatigue, Higher Level of Care Placement Needed]  General Interventions   General Interventions Discussed/Reviewed General Interventions Discussed, Labs, Vaccines, Doctor Visits, Health Screening, Annual Foot Exam, General Interventions Reviewed, Communication with, [name], Level of Care, Horticulturist, commercial (DME), Annual Eye Exam  [Encouraged Routine Engagement & Communication with Care Team Members.]  Labs Hgb A1c every 3 months, Kidney Function  [Encouraged Routine Labs.]  Vaccines COVID-19, Flu, Pneumonia, RSV, Shingles, Tetanus/Pertussis/Diphtheria  [Encouraged Annual Vaccinations.]  Doctor Visits Discussed/Reviewed Doctor Visits Discussed, Specialist, Doctor Visits Reviewed, Annual Wellness Visits, PCP  [Encouraged Routine Engagement & Communication with Care Team Members.]  Health Screening Bone Density, Colonoscopy, Prostate  [Encouraged Annual Health Screenings.]  Durable Medical Equipment (DME) BP Cuff, Other, Ephraim Hamburger, Prescription Eyeglasses.]  PCP/Specialist Visits Compliance with follow-up visit  [Encouraged Routine Engagement & Communication with Care Team Members.]  Communication with PCP/Specialists, RN, Pharmacists, Social Work   [employer] Routine Engagement & Communication with Care Team Members.]  Level of Care Adult Daycare, Air traffic controller, Assisted Living, Skilled Nursing Facility  [Confirmed Disinterest in Enrollment in Adult Day Care Program. Confirmed Interest in Pursusing Higher Level of Care Placement Options (I.e Skilled Nursing for Short-Term Rehabilitative Services Versus Long-Term Memory Care Assisted Living).]  Applications Medicaid, Personal Care Services  [Confirmed Disinterest in Applying for Medicaid or Special Assistance Long-Term Care Medicaid Due to Combined Income Exceeding Poverty Guidelines for [place] of [place] for Fiscal Year 2024/2025. Confirmed Ineligibility to Apply for Personal Care Services.]  Exercise Interventions   Exercise Discussed/Reviewed Exercise Discussed, Assistive device use and maintanence, Exercise Reviewed, Physical Activity, Weight Managment  [Patient Exercised 7 Days a Week for 30 Minutes Per Day Walking Dog with Wife Prior to Recent Fall.]  Physical Activity Discussed/Reviewed Physical Activity Discussed, Home Exercise Program (HEP), Physical Activity Reviewed, PREP, Gym, Types of exercise  [Confirmed with Wife Patient's Inability to Ambulate or Perform Activities of Daily Living Independently Since Recent Fall.]  Weight Management Weight maintenance  [Encouraged Supplemental Nutrition & Hydration.]  Education Interventions   Education Provided Provided Therapist, sports, Provided Web-based Education, Provided Education  [Encouraged Review, Consideration & Implementation.]  Provided Verbal Education On Nutrition, Foot Care, Eye Care, Labs, Mental Health/Coping with Illness, When to see the doctor, [name], [employer], Medication, Exercise, Blood Sugar Monitoring, Applications  [Encouraged Heart-Healthy, Low Fat, Low Sodium, Supplemental Diet.]  Labs Reviewed Hgb A1c  Applications Medicaid, Personal Care Services  [Confirmed Disinterest in Applying for Medicaid or Special  Assistance Long-Term Care Medicaid Due to Combined Income Exceeding Poverty Guidelines for [place] for Fiscal Year 2024/2025. Confirmed Ineligibility to Apply for Personal Care Services.]  Mental Health Interventions   Mental Health Discussed/Reviewed Mental Health Discussed, Anxiety, Depression, Grief and Loss, Mental Health Reviewed, Substance Abuse,  Coping Strategies, Crisis, Suicide, Other  [Assessed Mental Health & Cognitive Status of Patient & Caregiver, Wife.]  Nutrition Interventions   Nutrition Discussed/Reviewed Nutrition Discussed, Adding fruits and vegetables, Increasing proteins, Nutrition Reviewed, Decreasing fats, Decreasing salt, Fluid intake, Carbohydrate meal planning, Portion sizes, Decreasing sugar intake, Supplemental nutrition  [Confirmed Patient's Lack of Desire to Eat, Drink or Consume Prescription Medications.]  Pharmacy Interventions   Pharmacy Dicussed/Reviewed Pharmacy Topics Discussed, Medications and their functions, Pharmacy Topics Reviewed, Medication Adherence, Affording Medications  [Confirmed Ability to Afford Prescription Medications, But Current Non-Compliance.]  Medication Adherence Not taking medication  [Confirmed with Wife Patient's Desire to Stop Taking Prescription Medications.]  Safety Interventions   Safety Discussed/Reviewed Safety Discussed, Safety Reviewed, Fall Risk, Home Safety  [Encouraged Routine Use of Home Safety Devices & Durable Medical Equipment. Encouraged Consideration of Home Safety Evaluation & Home Health Physical Therapy Services, as Recommended.]  Home Safety Assistive Devices, Need for home safety assessment, Refer for home visit, Refer for community resources, Contact provider for referral to PT/OT  [Agreed to Contact Primary Care Provider to Request Order for Home Health Physical Therapy Services & Referral to [facility] Agency of Choice. Agreed to Contact Primary Care Provider to Request Review & Signature of FL-2 Form for Placement  Purposes.]  Advanced Directive Interventions   Advanced Directives Discussed/Reviewed Advanced Directives Discussed, Advanced Directives Reviewed, Advanced Care Planning, End of Life  [Confirmed Initiation of Advanced Directives (Living Will & Healthcare Power of Attorney Documents) & Encouraged to Provide Hospital Staff or Primary Care Providers with Copies to Scan into Electronic Medical Record in Epic.]  End of Life Hospice, Palliative  [Encouraged Consideration of End-of-Life Care & Palliative Care Consult through [facility] Compassionate Care.]       Active Listening & Reflection Utilized. Verbalization of Feelings Encouraged. Emotional Support Provided. Problem Solving Interventions Implemented. Task-Centered Solutions Activated.   Solution-Focused Strategies Employed. Acceptance & Commitment Therapy Performed Cognitive Behavioral Therapy Indicated. Client-Centered Therapy Initiated. CSW Collaboration with Wife, Genesis Pirl to Confirm Patient's Current Residence at [facility] (# [phone]), to Receive Short-Term Rehabilitative Services.  Encouraged Routine Engagement with Danford Bad, Licensed Clinical Social Worker with [facility] [phone]), if You Have Questions, Need Assistance, or If Additional Social Work Needs Are Identified Between Now & Our Next Follow-Up Outreach Call, Scheduled on 10/22/2023 at 11:30 AM.      Our next appointment is by telephone on 10/22/2023 at 11:30 AM.  Please call the care guide team at [phone] if you need to cancel or reschedule your appointment.   If you are experiencing a Mental Health or Behavioral Health Crisis or need someone to talk to, please call the Suicide and Crisis Lifeline: 988 call the [place] National Suicide Prevention Lifeline: 6204285252 or TTY: (339)321-7054 TTY 567 126 8524) to talk to a trained counselor call 1-800-273-TALK (toll free, 24 hour hotline) go to [facility] [address] [place] [phone]) call the [facility] Crisis Line: (513)788-4933 call 911  Patient verbalizes understanding of instructions and care plan provided today and agrees to view in MyChart. Active MyChart status and patient understanding of how to access instructions and care plan via MyChart confirmed with patient.     Telephone follow up appointment with care management team member scheduled for:  10/22/2023 at 11:30 AM.  Danford Bad, BSW, MSW, LCSW  Embedded Practice Social Work Case Manager  [facility], Population Health Direct Dial: [phone]  Fax: [phone] Email: [email or web address]  Website: [email or web address]

## 2023-10-04 NOTE — Progress Notes (Signed)
Location:  [facility] Room Number: 129 Place of Service:  SNF (31)   CODE STATUS: dnr   No Known Allergies  Chief Complaint  Patient presents with   Hospitalization Follow-up    HPI:   He is a 82 year old man who has been hospitalized from 09-27-23 through 10-01-23. 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 Prior to his hospitalizations he had been independent with his adls and ambulated independently. At this time he is dependent for his adl needs. He presented with acute dehydration; not eating and not drinking. He was treated for  Acute metabolic encephalopathy: / rhabdomyolysis:  secondary to dehydration and mild uremia. He was treated with IVF which improved his orientation COVID infection: he did require transient oxygen; and transitioned to room air. He will need mucolytics; antitussives and as an needed nebulizer. He was given a short burst of prednisone.  He is here for short term rehab with his goal to return back home. 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, psychosis disorder, mood disorder, anxiety:  Lumbosacral spondylosis without myelopathy:   Chronic kidney disease stage 3b:      Past Medical History:  Diagnosis Date   Coronary artery disease    does not see a cardiologist, reports no cardiac symptoms   Hyperlipidemia    Hypertension    Seasonal allergies     Past Surgical History:  Procedure Laterality Date   BACK SURGERY     fusion,rods and cage   CATARACT EXTRACTION W/ INTRAOCULAR LENS IMPLANT Bilateral    per wife   COLONOSCOPY  07/31/2011   Procedure: COLONOSCOPY;  Surgeon: Dalia Heading;  Location: [facility];  Service: Gastroenterology;  Laterality: N/A;    RETINAL DETACHMENT SURGERY Left    wife believes it was in the left eye   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   Procedure: RIGHT REVERSE SHOULDER ARTHROPLASTY;  Surgeon: Jones Broom, MD;  Location: [facility] OR;  Service: Orthopedics;  Laterality: Right;  Right reverse total shoulder    Social History   Socioeconomic History   Marital status: Married    Spouse name: Tommi Peerson   Number of children: 2   Years of education: college   Highest education level: Some college, no degree  Occupational History   Occupation: retired    Associate Professor: RETIRED  Tobacco Use   Smoking status: Former    Current packs/day: 0.00    Average packs/day: 1.5 packs/day for 40.0 years (60.0 ttl pk-yrs)    Types: Cigarettes    Start date: 10/12/1950    Quit date: 10/12/1990    Years since quitting: 33.0    Passive exposure: Past   Smokeless tobacco: Never  Vaping Use   Vaping status: Never Used  Substance and Sexual Activity   Alcohol use: No   Drug use: No   Sexual activity: Not Currently    Partners: Female  Other Topics Concern   Not on file  Social History Narrative   Not on file   Social Drivers of Health   Financial Resource Strain: Low Risk  (09/27/2023)   Overall Financial Resource Strain (CARDIA)    Difficulty of Paying Living Expenses: Not hard at all  Food Insecurity: No Food Insecurity (09/28/2023)   Hunger  Vital Sign    Worried About Programme researcher, broadcasting/film/video in the Last Year: Never true    Ran Out of Food in the Last Year: Never true  Transportation Needs: No Transportation Needs (09/28/2023)   PRAPARE - Administrator, Civil Service (Medical): No    Lack of Transportation (Non-Medical): No  Physical Activity: Sufficiently Active (09/27/2023)   Exercise Vital Sign    Days of Exercise per Week: 7 days    Minutes of Exercise per Session: 30 min  Recent Concern: Physical Activity - Inactive (09/27/2023)   Exercise  Vital Sign    Days of Exercise per Week: 0 days    Minutes of Exercise per Session: 0 min  Stress: No Stress Concern Present (09/27/2023)   [employer] of Occupational Health - Occupational Stress Questionnaire    Feeling of Stress : Not at all  Social Connections: Moderately Integrated (09/27/2023)   Social Connection and Isolation Panel [NHANES]    Frequency of Communication with Friends and Family: More than three times a week    Frequency of Social Gatherings with Friends and Family: More than three times a week    Attends Religious Services: 1 to 4 times per year    Active Member of [employer] or Organizations: No    Attends Banker Meetings: Never    Marital Status: Married  Catering manager Violence: Not At Risk (09/28/2023)   Humiliation, Afraid, Rape, and Kick questionnaire    Fear of Current or Ex-Partner: No    Emotionally Abused: No    Physically Abused: No    Sexually Abused: No   Family History  Problem Relation Age of Onset   Heart disease Unknown    Asthma Unknown       VITAL SIGNS BP 124/88   Pulse 63   Temp (!) 97.4 F (36.3 C)   Resp 20   Ht 5\' 8"  (1.727 m)   Wt 140 lb 12.8 oz (63.9 kg)   SpO2 98%   BMI 21.41 kg/m   Outpatient Encounter Medications as of 10/04/2023  Medication Sig Note   acetaminophen (TYLENOL) 325 MG tablet Take 2 tablets (650 mg total) by mouth every 6 (six) hours as needed for mild pain (pain score 1-3) (or Fever >/= 100.4).    albuterol (VENTOLIN HFA) 108 (90 Base) MCG/ACT inhaler Inhale into the lungs every 6 (six) hours as needed for wheezing or shortness of breath.    bisacodyl (DULCOLAX) 5 MG EC tablet Take 1 tablet (5 mg total) by mouth daily as needed for moderate constipation.    cyanocobalamin (VITAMIN B12) 500 MCG tablet Take 500 mcg by mouth daily.    dextromethorphan-guaiFENesin (MUCINEX DM) 30-600 MG 12hr tablet Take 1 tablet by mouth 2 (two) times daily for 8 days.    donepezil (ARICEPT) 10 MG tablet  Take 10 mg by mouth at bedtime.    levocetirizine (XYZAL) 5 MG tablet Take 5 mg by mouth every evening.    memantine (NAMENDA) 10 MG tablet Take 1 tablet (10 mg total) by mouth 2 (two) times daily.    pantoprazole (PROTONIX) 40 MG tablet Take 1 tablet (40 mg total) by mouth daily.    polyethylene glycol (MIRALAX / GLYCOLAX) 17 g packet Take 17 g by mouth daily as needed for mild constipation.    predniSONE (DELTASONE) 20 MG tablet Take 1 tablet (20 mg total) by mouth daily with breakfast for 4 days.    [DISCONTINUED] vitamin B-12 (VITAMIN B12) 500  MCG tablet Take 1 tablet (500 mcg total) by mouth daily. (Patient not taking: Take 500 mcg by mouth daily.) 09/28/2023: Has not started yet   No facility-administered encounter medications on file as of 10/04/2023.     SIGNIFICANT DIAGNOSTIC EXAMS  TODAY  09-27-23: wbc 9.5; hgb 12.8; hct 39.9; mcv 88.9 plt 193; glucose 105; bun 39; creat 1.39; k+ 3.5; na++ 145; ca 9.4 gfr 50; protein 6.2 albumin 3.0; CK 2986 09-29-23: wbc 4.1; hgb 10.4; hct 32.1; mcv 90.4 plt 167 glucose 84; bun 43; creat 1.23; k+ 3.0; na++ 141; ca 7.7; gfr 58; CK 988 10-04-23: wbc 6.4; hgb 11.8; hct 35.7; mcv 87.9 plt 286; glucose 91; bun 23; creat 0.97; k+ 3.3; na++ 142; ca 8.1; gfr >60   Review of Systems  Reason unable to perform ROS: unable to fully participate.   Physical Exam Constitutional:      General: He is not in acute distress.    Appearance: He is well-developed. He is not diaphoretic.  Neck:     Thyroid: No thyromegaly.  Cardiovascular:     Rate and Rhythm: Normal rate and regular rhythm.     Pulses: Normal pulses.     Heart sounds: Normal heart sounds.  Pulmonary:     Effort: Pulmonary effort is normal. No respiratory distress.     Breath sounds: Normal breath sounds.  Abdominal:     General: Bowel sounds are normal. There is no distension.     Palpations: Abdomen is soft.     Tenderness: There is no abdominal tenderness.  Musculoskeletal:         General: Normal range of motion.     Cervical back: Neck supple.     Right lower leg: No edema.     Left lower leg: No edema.  Lymphadenopathy:     Cervical: No cervical adenopathy.  Skin:    General: Skin is warm and dry.  Neurological:     Mental Status: He is alert. Mental status is at baseline.  Psychiatric:        Mood and Affect: Mood normal.      ASSESSMENT/ PLAN:  TODAY  Acute metabolic encephalopathy/nontraumatic rhabdomyolysis: mentation has improved has completed IVF while in the hospital. His CK has reduced to 988. Will repeat labs   2.  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 Lumbosacral spondylosis without myelopathy: is presently without uncontrolled pain; will monitor his status.   4.  Chronic kidney disease stage 3b: bun 23; creat 0.97; gfr >60  5. COVID 19 virus: will continue mucinex twice daily through 10-09-23; prednisone 20 mg daily through 10-05-23; has albuterol neb treatment every 6 hours as needed. Has 60 pack year history   6. Failure to thrive in adult: has poor appetite and fluid intake weight is 140 pounds   7. Hypokalemia: k+ 3.0 will give 40 meq today then 20 meq daily and will repeat blood work  8. Aortic atherosclerosis (cr 09-23-23) is not on statin due to advanced age  10. Primary hypertension: b/p 124/88 will monitor   10. Chronic non-seasonal allergic rhinitis: will continue xyzal 5 mg daily   11. GERD without esophagitis: will stop protonix 40 mg and will begin prilosec 20 mg daily   12. Chronic constipation: will continue miralax daily  as needed and dulcolax 5 mg po daily as  needed   13. Hypocalcemia: will begin tums three times daily      Synthia Innocent NP [facility]  call [phone]

## 2023-10-05 ENCOUNTER — Encounter: Payer: Self-pay | Admitting: Internal Medicine

## 2023-10-05 ENCOUNTER — Non-Acute Institutional Stay (SKILLED_NURSING_FACILITY): Payer: Self-pay | Admitting: Internal Medicine

## 2023-10-05 DIAGNOSIS — I10 Essential (primary) hypertension: Secondary | ICD-10-CM

## 2023-10-05 DIAGNOSIS — M6282 Rhabdomyolysis: Secondary | ICD-10-CM

## 2023-10-05 DIAGNOSIS — E876 Hypokalemia: Secondary | ICD-10-CM

## 2023-10-05 DIAGNOSIS — G9341 Metabolic encephalopathy: Secondary | ICD-10-CM | POA: Diagnosis not present

## 2023-10-05 DIAGNOSIS — J3089 Other allergic rhinitis: Secondary | ICD-10-CM | POA: Diagnosis not present

## 2023-10-05 DIAGNOSIS — R627 Adult failure to thrive: Secondary | ICD-10-CM | POA: Diagnosis not present

## 2023-10-05 NOTE — Assessment & Plan Note (Signed)
Blood pressure is adequately controlled off of hydrochlorothiazide which was associated with hypokalemia.  I recommended to his wife that this not be restarted when his blood pressure rises alternative agent such as spironolactone initiated.  HCTZ was listed as a "allergy" in the Epic record.

## 2023-10-05 NOTE — Assessment & Plan Note (Signed)
During his 2 hospitalizations 12/12 - 12/14 and 12/16 -10/01/2023 nadir potassium was 3.0.  Hydrochlorothiazide has been discontinued and the ADE documented under "Allergies"

## 2023-10-05 NOTE — Assessment & Plan Note (Signed)
Because of somnolence; Xyzal was discontinued.  His wife validated he is not having active extrinsic allergic symptoms at this time.  If such recurs; nonsedating antihistamine can be initiated ,such as loratadine,

## 2023-10-05 NOTE — Patient Instructions (Signed)
See assessment and plan under each diagnosis in the problem list and acutely for this visit 

## 2023-10-05 NOTE — Progress Notes (Unsigned)
NURSING HOME LOCATION:  [facility] ROOM NUMBER:  [id number]  CODE STATUS:  DNR  PCP:  Carylon Perches MD  This is a comprehensive admission note to this SNFperformed on this date less than 30 days from date of admission. Included are preadmission medical/surgical history; reconciled medication list; family history; social history and comprehensive review of systems.  Corrections and additions to the records were documented. Comprehensive physical exam was also performed. Additionally a clinical summary was entered for each active diagnosis pertinent to this admission in the Problem List to enhance continuity of care.  HPI: The history is somewhat convoluted.  He was discharged to this facility on 12/20 we have actually been in the hospital 12/12 - 09/25/2023.  The original hospitalization was related to a mechanical fall which occurred on 12/6 while he is walking his dog.  Apparently he may have slipped on a stick or wet leaves and fallen.  Imaging in the ED 12/12 revealed right periorbital edema.  A supraorbital laceration was repaired. His wife to take him to the ED because of acute mental status changes associated with a shuffling gait and decreased p.o. intake.  Apparently he had retreated to his son room and sat in the chair for 12-18 hours.  She noted him to be more somnolent on 12/12 and was unable to arise from the couch. In the ED UA revealed 0-5 white blood cells.  He was found to be positive for COVID on screening.  The initial potassium was 3.8 with a nadir of 3.1 and final value of 3.7.  HCTZ was discontinued.  Creatinine was elevated from baseline with a value of 1.62 and GFR of 42.  With rehydration final values of 1.14 and greater than 60.  Calcium was normal at 9.9 but dropped to 8.4.  CK was found to be 765; follow-up value was 576.  H/H was 15.2/46.7 with a nadir value of 12.3/38.2 after hydration.  Even though the indices were normochromic, normocytic his B12 level was  146.  He received 1000 mcg of vitamin B12 IM and was placed on 500 mg daily.  Folate level was 10.6.  TSH was low therapeutic at 0.544. He was rehospitalized 12/16 - 12/20 again with acute mental status changes with suggestion of clinical dehydration and mild uremia.  Apparently he had been complaining of increased back pain prior to the second admission.  He was totally dependent on his spouse and family for ADLs at that time.  Potassium was 3.5 off HCTZ but dropped to 3.0.  Calcium values initially were 9.4 with a nadir of 7.7 and final value of 8.1.  Again the creatinine was elevated at 1.39 with a GFR of 50; final values were 1.23 and 58.  CK was 2986; this did drop to 988.  There was progression of the anemia with initial H/H of 12.8/39.9 with a nadir of 10.4/32.1.  Again the indices were normochromic normocytic.  He was felt to be stable enough for discharge to the SNF as recommended by PT/OT.  Past medical and surgical history: Includes coronary artery disease, dyslipidemia, hypertension, dementia, and seasonal allergies. Surgeries and procedures include colonoscopy, reverse shoulder arthroplasty, and back surgery.  Family history: reviewed, non contributory due to advanced age.  Social history: Nondrinker;  60-pack-year history of smoking.   Review of systems: Severe dementia precluded ROS completion.  Despite his history of chronic back pain he denied any back pain.  When I asked what was a diagnoses of diagnoses made  while hospitalized as well as recommended his response was "I do not know."  He could provide no history.  Despite the fact that he was coughing intermittently he denied any cough or respiratory symptoms.  His wife states that he has chronic poor appetite.  Physical exam:  Pertinent or positive findings: He appears his age and somewhat chronically ill.  He has pattern alopecia particularly over the crown.  Hair is thin and slightly disheveled.  Asymmetric ptosis is present.  There  is a well-healed laceration above the right eye.  There is faint ecchymoses over the right lateral malar area.  Breath sounds are decreased.  He exhibits an intermittent rattling nonproductive cough.  Heart sounds are markedly distant.  Heart sounds are heard best in the epigastrium.  The right posterior tibial pulse is stronger than the left.  The dorsalis pedis pulses are decreased.  He has interosseous wasting.  Strength could not be tested as he cannot follow commands.  General appearance: Adequately nourished; no acute distress, increased work of breathing is present.   Lymphatic: No lymphadenopathy about the head, neck, axilla. Eyes: No conjunctival inflammation or lid edema is present. There is no scleral icterus. Ears:  External ear exam shows no significant lesions or deformities.   Nose:  External nasal examination shows no deformity or inflammation. Nasal mucosa are pink and moist without lesions, exudates Oral exam: Lips and gums are healthy appearing.There is no oropharyngeal erythema or exudate. Neck:  No thyromegaly, masses, tenderness noted.    Heart:  Normal rate and regular rhythm. S1 and S2 normal without gallop, murmur, click, rub.  Lungs: Chest clear to auscultation without wheezes, rhonchi, rales, rubs. Abdomen: Bowel sounds are normal.  Abdomen is soft and nontender with no organomegaly, hernias, masses. GU: Deferred  Extremities:  No cyanosis, clubbing, edema. Neurologic exam:  Strength equal  in upper & lower extremities. Balance, Rhomberg, finger to nose testing could not be completed due to clinical state Deep tendon reflexes are equal Skin: Warm & dry w/o tenting. No significant lesions or rash.  See clinical summary under each active problem in the Problem List with associated updated therapeutic plan

## 2023-10-05 NOTE — Assessment & Plan Note (Signed)
He has no complaints of myalgia; but unfortunately his dementia invalidates his responses to ROS queries.  CK can be rechecked at next blood draw.

## 2023-10-05 NOTE — Assessment & Plan Note (Signed)
Mental status testing will need to be repeated once he is returns to his clinical baseline.  The COVID and B12 level may be significant component to his neurocognitive deficits.

## 2023-10-05 NOTE — Assessment & Plan Note (Signed)
Reassessment is necessary after repletion of his B12 deficiency.  Additionally TSH should be rechecked was as low therapeutic at 0.544.

## 2023-10-07 ENCOUNTER — Other Ambulatory Visit (HOSPITAL_COMMUNITY)
Admission: RE | Admit: 2023-10-07 | Discharge: 2023-10-07 | Disposition: A | Discharge status: Home / Self Care | Payer: Medicare PPO | Source: Skilled Nursing Facility | Attending: Adult Health | Admitting: Adult Health

## 2023-10-07 DIAGNOSIS — M6282 Rhabdomyolysis: Secondary | ICD-10-CM | POA: Insufficient documentation

## 2023-10-07 LAB — BASIC METABOLIC PANEL
Anion gap: 10 (ref 5–15)
BUN: 23 mg/dL (ref 8–23)
CO2: 24 mmol/L (ref 22–32)
Calcium: 8.1 mg/dL — ABNORMAL LOW (ref 8.9–10.3)
Chloride: 102 mmol/L (ref 98–111)
Creatinine, Ser: 0.92 mg/dL (ref 0.61–1.24)
GFR, Estimated: >60 mL/min (ref >60)
Glucose, Bld: 73 mg/dL (ref 70–99)
Potassium: 3.5 mmol/L (ref 3.5–5.1)
Sodium: 136 mmol/L (ref 135–145)

## 2023-10-07 LAB — CK TOTAL AND CKMB (NOT AT ARMC)
CK, MB: 1.5 ng/mL (ref 0.5–5.0)
Total CK: 27 U/L — ABNORMAL LOW (ref 49–397)

## 2023-10-11 ENCOUNTER — Encounter: Payer: Self-pay | Admitting: Adult Health

## 2023-10-11 ENCOUNTER — Non-Acute Institutional Stay (INDEPENDENT_AMBULATORY_CARE_PROVIDER_SITE_OTHER): Payer: Self-pay | Admitting: Adult Health

## 2023-10-11 DIAGNOSIS — F0390 Unspecified dementia without behavioral disturbance: Secondary | ICD-10-CM

## 2023-10-11 DIAGNOSIS — G301 Alzheimer's disease with late onset: Secondary | ICD-10-CM

## 2023-10-11 DIAGNOSIS — R627 Adult failure to thrive: Secondary | ICD-10-CM

## 2023-10-11 NOTE — Progress Notes (Signed)
Location:  [facility] Room Number: 129 Place of Service:  SNF (31)   CODE STATUS: dnr   Allergies  Allergen Reactions   Hydrochlorothiazide Other (See Comments)    hypokalemia    Chief Complaint  Patient presents with   Acute Visit    Poor appetite     HPI:  He has not been eating or drinking over the past several days to week. He was started on low dose remeron to help with eating. His family has brought in foods without effect. His family tells me that he has never been a big eater. He has lost weight from 144 pounds to 138 pounds. His family feels he may do well with magic cup and / or protein cookie. We did discuss feeding tubes at this time will not make any changes.   Past Medical History:  Diagnosis Date   Coronary artery disease    does not see a cardiologist, reports no cardiac symptoms   Hyperlipidemia    Hypertension    Seasonal allergies     Past Surgical History:  Procedure Laterality Date   BACK SURGERY     fusion,rods and cage   CATARACT EXTRACTION W/ INTRAOCULAR LENS IMPLANT Bilateral    per wife   COLONOSCOPY  07/31/2011   Procedure: COLONOSCOPY;  Surgeon: Dalia Heading;  Location: [facility];  Service: Gastroenterology;  Laterality: N/A;   RETINAL DETACHMENT SURGERY Left    wife believes it was in the left eye   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   Procedure: RIGHT REVERSE SHOULDER ARTHROPLASTY;  Surgeon: Jones Broom, MD;  Location: [facility] OR;  Service: Orthopedics;  Laterality: Right;  Right reverse total shoulder    Social History   Socioeconomic History   Marital status: Married    Spouse name: Kagen Urgiles   Number of children: 2   Years of education: college   Highest education level: Some college, no degree  Occupational History   Occupation: retired    Associate Professor: RETIRED  Tobacco Use   Smoking status: Former    Current packs/day: 0.00    Average packs/day: 1.5 packs/day for 40.0 years (60.0  ttl pk-yrs)    Types: Cigarettes    Start date: 10/12/1950    Quit date: 10/12/1990    Years since quitting: 33.0    Passive exposure: Past   Smokeless tobacco: Never  Vaping Use   Vaping status: Never Used  Substance and Sexual Activity   Alcohol use: No   Drug use: No   Sexual activity: Not Currently    Partners: Female  Other Topics Concern   Not on file  Social History Narrative   Not on file   Social Drivers of Health   Financial Resource Strain: Low Risk  (09/27/2023)   Overall Financial Resource Strain (CARDIA)    Difficulty of Paying Living Expenses: Not hard at all  Food Insecurity: No Food Insecurity (09/28/2023)   Hunger Vital Sign    Worried About Running Out of Food in the Last Year: Never true    Ran Out of Food in the Last Year: Never true  Transportation Needs: No Transportation Needs (09/28/2023)   PRAPARE - Administrator, Civil Service (Medical): No    Lack of Transportation (Non-Medical): No  Physical Activity: Sufficiently Active (09/27/2023)   Exercise Vital Sign    Days of Exercise per Week: 7 days    Minutes of Exercise per Session: 30 min  Recent Concern:  Physical Activity - Inactive (09/27/2023)   Exercise Vital Sign    Days of Exercise per Week: 0 days    Minutes of Exercise per Session: 0 min  Stress: No Stress Concern Present (09/27/2023)   [employer] of Occupational Health - Occupational Stress Questionnaire    Feeling of Stress : Not at all  Social Connections: Moderately Integrated (09/27/2023)   Social Connection and Isolation Panel [NHANES]    Frequency of Communication with Friends and Family: More than three times a week    Frequency of Social Gatherings with Friends and Family: More than three times a week    Attends Religious Services: 1 to 4 times per year    Active Member of [employer] or Organizations: No    Attends Banker Meetings: Never    Marital Status: Married  Catering manager Violence: Not At  Risk (09/28/2023)   Humiliation, Afraid, Rape, and Kick questionnaire    Fear of Current or Ex-Partner: No    Emotionally Abused: No    Physically Abused: No    Sexually Abused: No   Family History  Problem Relation Age of Onset   Heart disease Unknown    Asthma Unknown       VITAL SIGNS BP 128/76   Pulse 62   Temp 98.4 F (36.9 C)   Resp 18   Ht 5\' 8"  (1.727 m)   Wt 138 lb (62.6 kg)   SpO2 92%   BMI 20.98 kg/m   Outpatient Encounter Medications as of 10/11/2023  Medication Sig Note   mirtazapine (REMERON) 7.5 MG tablet Take 7.5 mg by mouth at bedtime.    acetaminophen (TYLENOL) 325 MG tablet Take 2 tablets (650 mg total) by mouth every 6 (six) hours as needed for mild pain (pain score 1-3) (or Fever >/= 100.4).    albuterol (VENTOLIN HFA) 108 (90 Base) MCG/ACT inhaler Inhale into the lungs every 6 (six) hours as needed for wheezing or shortness of breath.    bisacodyl (DULCOLAX) 5 MG EC tablet Take 1 tablet (5 mg total) by mouth daily as needed for moderate constipation.    calcium carbonate (TUMS - DOSED IN MG ELEMENTAL CALCIUM) 500 MG chewable tablet Chew 1 tablet by mouth 3 (three) times daily.    cyanocobalamin (VITAMIN B12) 500 MCG tablet Take 500 mcg by mouth daily. 10/05/2023: Increased to 1000 mcg qd   donepezil (ARICEPT) 10 MG tablet Take 10 mg by mouth at bedtime.    levocetirizine (XYZAL) 5 MG tablet Take 5 mg by mouth every evening. 10/05/2023: D/Ced @ [facility] due to somnulence   memantine (NAMENDA) 10 MG tablet Take 1 tablet (10 mg total) by mouth 2 (two) times daily.    omeprazole (PRILOSEC) 20 MG capsule Take 20 mg by mouth daily.    polyethylene glycol (MIRALAX / GLYCOLAX) 17 g packet Take 17 g by mouth daily as needed for mild constipation.    potassium chloride SA (KLOR-CON M) 20 MEQ tablet Take 20 mEq by mouth daily.    Protein (PROSOURCE PO) Take 30 mLs by mouth 3 (three) times daily.    No facility-administered encounter medications on file as of  10/11/2023.     SIGNIFICANT DIAGNOSTIC EXAMS  PREVIOUS   09-27-23: wbc 9.5; hgb 12.8; hct 39.9; mcv 88.9 plt 193; glucose 105; bun 39; creat 1.39; k+ 3.5; na++ 145; ca 9.4 gfr 50; protein 6.2 albumin 3.0; CK 2986 09-29-23: wbc 4.1; hgb 10.4; hct 32.1; mcv 90.4 plt 167 glucose 84;  bun 43; creat 1.23; k+ 3.0; na++ 141; ca 7.7; gfr 58; CK 988 10-04-23: wbc 6.4; hgb 11.8; hct 35.7; mcv 87.9 plt 286; glucose 91; bun 23; creat 0.97; k+ 3.3; na++ 142; ca 8.1; gfr >60   TODAY  10-07-23: glucose 73; bun 23; creat 0.92; k+ 3.5; na=+ 136; ca 8.1; gfr >60 CK 27  Review of Systems  Reason unable to perform ROS: unable to fully participate.   Physical Exam Constitutional:      General: He is not in acute distress.    Appearance: He is well-developed. He is not diaphoretic.  Neck:     Thyroid: No thyromegaly.  Cardiovascular:     Rate and Rhythm: Normal rate and regular rhythm.     Pulses: Normal pulses.     Heart sounds: Normal heart sounds.  Pulmonary:     Effort: Pulmonary effort is normal. No respiratory distress.     Breath sounds: Normal breath sounds.  Abdominal:     General: Bowel sounds are normal. There is no distension.     Palpations: Abdomen is soft.     Tenderness: There is no abdominal tenderness.  Musculoskeletal:        General: Normal range of motion.     Cervical back: Neck supple.     Right lower leg: No edema.     Left lower leg: No edema.  Lymphadenopathy:     Cervical: No cervical adenopathy.  Skin:    General: Skin is warm and dry.  Neurological:     Mental Status: He is alert. Mental status is at baseline.     Comments: BCAT 17/50   Psychiatric:        Mood and Affect: Mood normal.      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     Synthia Innocent NP [facility]   call [phone]

## 2023-10-14 ENCOUNTER — Ambulatory Visit: Payer: Self-pay | Admitting: *Deleted

## 2023-10-14 NOTE — Patient Instructions (Signed)
 Visit Information  Thank you for taking time to visit with me today. Please don't hesitate to contact me if I can be of assistance to you.   Following are the goals we discussed today:   Goals Addressed             This Visit's Progress    Receive Assistance with Higher Level of Care Placement.   On track    Care Coordination Interventions:  Interventions Today    Flowsheet Row Most Recent Value  Chronic Disease   Chronic disease during today's visit Other, Chronic Kidney Disease/End Stage Renal Disease (ESRD)  [Major Neurocognitive Disorder, Dementia, Acute Metabolic Encephalopathy, Frequent Falls, Recent Fall w/ Injury, Unsteady Balance/Gait, Inability to Perform Activities of Daily Living Independently, Caregiver Fatigue, Higher Level of Care Placement Needed]  General Interventions   General Interventions Discussed/Reviewed General Interventions Discussed, Labs, Vaccines, Doctor Visits, Health Screening, Annual Foot Exam, General Interventions Reviewed, Communication with, [name], Level of Care, Horticulturist, Commercial (DME), Annual Eye Exam  [Encouraged Routine Engagement & Communication with Care Team Members.]  Labs Hgb A1c every 3 months, Kidney Function  [Encouraged Routine Labs.]  Vaccines COVID-19, Flu, Pneumonia, RSV, Shingles, Tetanus/Pertussis/Diphtheria  [Encouraged Annual Vaccinations.]  Doctor Visits Discussed/Reviewed Doctor Visits Discussed, Specialist, Doctor Visits Reviewed, Annual Wellness Visits, PCP  [Encouraged Routine Engagement & Communication with Care Team Members.]  Health Screening Bone Density, Colonoscopy, Prostate  [Encouraged Annual Health Screenings.]  Durable Medical Equipment (DME) BP Cuff, Other, Vannie Pilling, Prescription Eyeglasses.]  PCP/Specialist Visits Compliance with follow-up visit  [Encouraged Routine Engagement & Communication with Care Team Members.]  Communication with PCP/Specialists, RN, Pharmacists, Social Work   [employer] Routine Engagement & Communication with Care Team Members.]  Level of Care Adult Daycare, Air Traffic Controller, Assisted Living, Skilled Nursing Facility  [Confirmed Disinterest in Enrollment in Adult Day Care Program. Confirmed Interest in Pursusing Higher Level of Care Placement Options (I.e Skilled Nursing for Short-Term Rehabilitative Services Versus Long-Term Memory Care Assisted Living).]  Applications Medicaid, Personal Care Services  [Confirmed Disinterest in Applying for Medicaid or Special Assistance Long-Term Care Medicaid Due to Combined Income Exceeding Poverty Guidelines for State of [place] for Fiscal Year 2024/2025. Confirmed Ineligibility to Apply for Personal Care Services.]  Exercise Interventions   Exercise Discussed/Reviewed Exercise Discussed, Assistive device use and maintanence, Exercise Reviewed, Physical Activity, Weight Managment  [Patient Exercised 7 Days a Week for 30 Minutes Per Day Walking Dog with Wife Prior to Recent Fall.]  Physical Activity Discussed/Reviewed Physical Activity Discussed, Home Exercise Program (HEP), Physical Activity Reviewed, PREP, Gym, Types of exercise  [Confirmed with Wife Patient's Inability to Ambulate or Perform Activities of Daily Living Independently Since Recent Fall.]  Weight Management Weight maintenance  [Encouraged Supplemental Nutrition & Hydration.]  Education Interventions   Education Provided Provided Therapist, Sports, Provided Web-based Education, Provided Education  [Encouraged Review, Consideration & Implementation.]  Provided Verbal Education On Nutrition, Foot Care, Eye Care, Labs, Mental Health/Coping with Illness, When to see the doctor, [name], [employer], Medication, Exercise, Blood Sugar Monitoring, Applications  [Encouraged Heart-Healthy, Low Fat, Low Sodium, Supplemental Diet.]  Labs Reviewed Hgb A1c  Applications Medicaid, Personal Care Services  [Confirmed Disinterest in Applying for Medicaid or Special  Assistance Long-Term Care Medicaid Due to Combined Income Exceeding Poverty Guidelines for [place] of [place] for Fiscal Year 2024/2025. Confirmed Ineligibility to Apply for Personal Care Services.]  Mental Health Interventions   Mental Health Discussed/Reviewed Mental Health Discussed, Anxiety, Depression, Grief and Loss, Mental Health Reviewed, Substance Abuse,  Coping Strategies, Crisis, Suicide, Other  [Assessed Mental Health & Cognitive Status of Patient & Caregiver, Wife.]  Nutrition Interventions   Nutrition Discussed/Reviewed Nutrition Discussed, Adding fruits and vegetables, Increasing proteins, Nutrition Reviewed, Decreasing fats, Decreasing salt, Fluid intake, Carbohydrate meal planning, Portion sizes, Decreasing sugar intake, Supplemental nutrition  [Confirmed Patient's Lack of Desire to Eat, Drink or Consume Prescription Medications.]  Pharmacy Interventions   Pharmacy Dicussed/Reviewed Pharmacy Topics Discussed, Medications and their functions, Pharmacy Topics Reviewed, Medication Adherence, Affording Medications  [Confirmed Ability to Afford Prescription Medications, But Current Non-Compliance.]  Medication Adherence Not taking medication  [Confirmed with Wife Patient's Desire to Stop Taking Prescription Medications.]  Safety Interventions   Safety Discussed/Reviewed Safety Discussed, Safety Reviewed, Fall Risk, Home Safety  [Encouraged Routine Use of Home Safety Devices & Durable Medical Equipment. Encouraged Consideration of Home Safety Evaluation & Home Health Physical Therapy Services, as Recommended.]  Home Safety Assistive Devices, Need for home safety assessment, Refer for home visit, Refer for community resources, Contact provider for referral to PT/OT  [Agreed to Contact Primary Care Provider to Request Order for Home Health Physical Therapy Services & Referral to [facility] Agency of Choice. Agreed to Contact Primary Care Provider to Request Review & Signature of FL-2 Form for Placement  Purposes.]  Advanced Directive Interventions   Advanced Directives Discussed/Reviewed Advanced Directives Discussed, Advanced Directives Reviewed, Advanced Care Planning, End of Life  [Confirmed Initiation of Advanced Directives (Living Will & Healthcare Power of Attorney Documents) & Encouraged to Provide Hospital Staff or Primary Care Providers with Copies to Scan into Electronic Medical Record in Epic.]  End of Life Hospice, Palliative  [Encouraged Consideration of End-of-Life Care & Palliative Care Consult through [facility].]       Active Listening & Reflection Utilized. Verbalization of Feelings Encouraged. Emotional Support Provided. Problem Solving Interventions Employed. Solution-Focused Strategies Implemented. Acceptance & Commitment Therapy Indicated. Cognitive Behavioral Therapy Initiated. CSW Collaboration with Son, Bishoy Cupp to Confirm Patient's Continued Residence at [facility] [phone]) to Receive Short-Term Rehabilitative Services Until 10/15/2023 & On 10/16/2023 Patient Will Be Moved to Long-Term Extended Care Bed at [facility]. CSW Collaboration with Son, Ayeden Gladman to Encourage Consideration of Patient Receiving Hospice & Palliative Care Services through [facility] [phone]), Offering to Obtain Order from Dr. Gaither Langton, Primary Care Provider [phone]), Via Routed Note in Epic. CSW Collaboration with Son, Fread Kottke to Encourage Routine Engagement with Philippe Desanctis, Licensed Clinical Social Worker with [facility] [phone]), if You Have Questions, Need Assistance, or If Additional Social Work Needs Are Identified Between Now & Our Next Follow-Up Outreach Call, Scheduled on 10/22/2023 at 11:30 AM.      Our next appointment is by telephone on 10/22/2023 at 11:30 am.  Please call the care guide team at [phone] if you need to cancel or reschedule  your appointment.   If you are experiencing a Mental Health or Behavioral Health Crisis or need someone to talk to, please call the Suicide and Crisis Lifeline: 988 call the [place]  National Suicide Prevention Lifeline: 559-106-2248 or TTY: 220-465-6182 TTY 240-582-4951) to talk to a trained counselor call 1-800-273-TALK (toll free, 24 hour hotline) go to [facility] [address], [place] [phone]) call the [facility] Crisis Line: 641-725-9214 call 911  Patient verbalizes understanding of instructions and care plan provided today and agrees to view in MyChart. Active MyChart status and patient understanding of how to access  instructions and care plan via MyChart confirmed with patient.     Telephone follow up appointment with care management team member scheduled for:  10/22/2023 at 11:30 am.   Philippe Desanctis, BSW, MSW, LCSW  Embedded Practice Social Work Case Manager  [facility], Population Health Direct Dial: [phone]  Fax: [phone] Email: [email or web address] [email or web address]

## 2023-10-14 NOTE — Patient Outreach (Signed)
 Care Coordination   Follow Up Visit Note   10/14/2023  Name: Michael [name] MRN: [id number] DOB: [birth year]  Michael [name] is a 83 y.o. year old male who sees Sheryle Carwin, MD for primary care. I spoke with patient's son, Michael [name] by phone today.  What matters to the patients health and wellness today?  Receive Assistance with Higher Level of Care Placement.    Goals Addressed             This Visit's Progress    Receive Assistance with Higher Level of Care Placement.   On track    Care Coordination Interventions:  Interventions Today    Flowsheet Row Most Recent Value  Chronic Disease   Chronic disease during today's visit Other, Chronic Kidney Disease/End Stage Renal Disease (ESRD)  [Major Neurocognitive Disorder, Dementia, Acute Metabolic Encephalopathy, Frequent Falls, Recent Fall w/ Injury, Unsteady Balance/Gait, Inability to Perform Activities of Daily Living Independently, Caregiver Fatigue, Higher Level of Care Placement Needed]  General Interventions   General Interventions Discussed/Reviewed General Interventions Discussed, Labs, Vaccines, Doctor Visits, Health Screening, Annual Foot Exam, General Interventions Reviewed, Communication with, [name], Level of Care, Horticulturist, Commercial (DME), Annual Eye Exam  [Encouraged Routine Engagement & Communication with Care Team Members.]  Labs Hgb A1c every 3 months, Kidney Function  [Encouraged Routine Labs.]  Vaccines COVID-19, Flu, Pneumonia, RSV, Shingles, Tetanus/Pertussis/Diphtheria  [Encouraged Annual Vaccinations.]  Doctor Visits Discussed/Reviewed Doctor Visits Discussed, Specialist, Doctor Visits Reviewed, Annual Wellness Visits, PCP  [Encouraged Routine Engagement & Communication with Care Team Members.]  Health Screening Bone Density, Colonoscopy, Prostate  [Encouraged Annual Health Screenings.]  Durable Medical Equipment (DME) BP Cuff, Other, Michael [name], Prescription Eyeglasses.]   PCP/Specialist Visits Compliance with follow-up visit  [Encouraged Routine Engagement & Communication with Care Team Members.]  Communication with PCP/Specialists, RN, Pharmacists, Social Work  [employer] Routine Engagement & Communication with Care Team Members.]  Level of Care Adult Daycare, Air Traffic Controller, Assisted Living, Skilled Nursing Facility  [Confirmed Disinterest in Enrollment in Adult Day Care Program. Confirmed Interest in Pursusing Higher Level of Care Placement Options (I.e Skilled Nursing for Short-Term Rehabilitative Services Versus Long-Term Memory Care Assisted Living).]  Applications Medicaid, Personal Care Services  [Confirmed Disinterest in Applying for Medicaid or Special Assistance Long-Term Care Medicaid Due to Combined Income Exceeding Poverty Guidelines for State of [place] for Fiscal Year 2024/2025. Confirmed Ineligibility to Apply for Personal Care Services.]  Exercise Interventions   Exercise Discussed/Reviewed Exercise Discussed, Assistive device use and maintanence, Exercise Reviewed, Physical Activity, Weight Managment  [Patient Exercised 7 Days a Week for 30 Minutes Per Day Walking Dog with Wife Prior to Recent Fall.]  Physical Activity Discussed/Reviewed Physical Activity Discussed, Home Exercise Program (HEP), Physical Activity Reviewed, PREP, Gym, Types of exercise  [Confirmed with Wife Patient's Inability to Ambulate or Perform Activities of Daily Living Independently Since Recent Fall.]  Weight Management Weight maintenance  [Encouraged Supplemental Nutrition & Hydration.]  Education Interventions   Education Provided Provided Therapist, Sports, Provided Web-based Education, Provided Education  [Encouraged Review, Consideration & Implementation.]  Provided Verbal Education On Nutrition, Foot Care, Eye Care, Labs, Mental Health/Coping with Illness, When to see the doctor, [name], [employer], Medication, Exercise, Blood Sugar Monitoring, Applications   [Encouraged Heart-Healthy, Low Fat, Low Sodium, Supplemental Diet.]  Labs Reviewed Hgb A1c  Applications Medicaid, Personal Care Services  [Confirmed Disinterest in Applying for Medicaid or Special Assistance Long-Term Care Medicaid Due to Combined Income Exceeding Poverty Guidelines for Michael [name] for  Fiscal Year 2024/2025. Confirmed Ineligibility to Apply for Personal Care Services.]  Mental Health Interventions   Mental Health Discussed/Reviewed Mental Health Discussed, Anxiety, Depression, Grief and Loss, Mental Health Reviewed, Substance Abuse, Coping Strategies, Crisis, Suicide, Other  [Assessed Mental Health & Cognitive Status of Patient & Caregiver, Wife.]  Nutrition Interventions   Nutrition Discussed/Reviewed Nutrition Discussed, Adding fruits and vegetables, Increasing proteins, Nutrition Reviewed, Decreasing fats, Decreasing salt, Fluid intake, Carbohydrate meal planning, Portion sizes, Decreasing sugar intake, Supplemental nutrition  [Confirmed Patient's Lack of Desire to Eat, Drink or Consume Prescription Medications.]  Pharmacy Interventions   Pharmacy Dicussed/Reviewed Pharmacy Topics Discussed, Medications and their functions, Pharmacy Topics Reviewed, Medication Adherence, Affording Medications  [Confirmed Ability to Afford Prescription Medications, But Current Non-Compliance.]  Medication Adherence Not taking medication  [Confirmed with Wife Patient's Desire to Stop Taking Prescription Medications.]  Safety Interventions   Safety Discussed/Reviewed Safety Discussed, Safety Reviewed, Fall Risk, Home Safety  [Encouraged Routine Use of Home Safety Devices & Durable Medical Equipment. Encouraged Consideration of Home Safety Evaluation & Home Health Physical Therapy Services, as Recommended.]  Home Safety Assistive Devices, Need for home safety assessment, Refer for home visit, Refer for community resources, Contact provider for referral to PT/OT  [Agreed to Contact Primary Care Provider  to Request Order for Home Health Physical Therapy Services & Referral to [facility] Agency of Choice. Agreed to Contact Primary Care Provider to Request Review & Signature of FL-2 Form for Placement Purposes.]  Advanced Directive Interventions   Advanced Directives Discussed/Reviewed Advanced Directives Discussed, Advanced Directives Reviewed, Advanced Care Planning, End of Life  [Confirmed Initiation of Advanced Directives (Living Will & Healthcare Power of Attorney Documents) & Encouraged to Provide Hospital Staff or Primary Care Providers with Copies to Scan into Electronic Medical Record in Epic.]  End of Life Hospice, Palliative  [Encouraged Consideration of End-of-Life Care & Palliative Care Consult through [facility].]       Active Listening & Reflection Utilized. Verbalization of Feelings Encouraged. Emotional Support Provided. Problem Solving Interventions Employed. Solution-Focused Strategies Implemented. Acceptance & Commitment Therapy Indicated. Cognitive Behavioral Therapy Initiated. CSW Collaboration with Son, Avon Mergenthaler to Confirm Patient's Continued Residence at [facility] [phone]) to Receive Short-Term Rehabilitative Services Until 10/15/2023 & On 10/16/2023 Patient Will Be Moved to Long-Term Extended Care Bed at [facility]. CSW Collaboration with Son, Jeremiyah Cullens to Encourage Consideration of Patient Receiving Hospice & Palliative Care Services through [facility] [phone]), Offering to Obtain Order from Dr. Gaither Langton, Primary Care Provider [phone]), Via Routed Note in Epic. CSW Collaboration with Son, Navin Dogan to Encourage Routine Engagement with Philippe Desanctis, Licensed Clinical Social Worker with [facility] [phone]), if You Have Questions, Need Assistance, or If Additional Social Work Needs Are Identified Between Now & Our Next Follow-Up Outreach  Call, Scheduled on 10/22/2023 at 11:30 AM.      SDOH assessments and interventions completed:  Yes.  Care Coordination Interventions:  Yes, provided.   Follow up plan: Follow up call scheduled for 10/22/2023 at 11:30 am.   Encounter Outcome:  Patient Visit Completed.   Philippe Desanctis, BSW, MSW, Printmaker Social Work Case Set Designer Health  [facility], Population Health Direct Dial: [phone]  Fax: [phone] Email: [email or web address] Website: [email or web address]

## 2023-10-15 ENCOUNTER — Non-Acute Institutional Stay (SKILLED_NURSING_FACILITY): Payer: Self-pay | Admitting: Adult Health

## 2023-10-15 ENCOUNTER — Encounter: Payer: Self-pay | Admitting: Adult Health

## 2023-10-15 DIAGNOSIS — I700 Atherosclerosis of aorta: Secondary | ICD-10-CM

## 2023-10-15 DIAGNOSIS — R627 Adult failure to thrive: Secondary | ICD-10-CM

## 2023-10-15 DIAGNOSIS — F0390 Unspecified dementia without behavioral disturbance: Secondary | ICD-10-CM

## 2023-10-15 NOTE — Progress Notes (Signed)
 Location:  [facility] Room Number: 129 Place of Service:  SNF (31)   CODE STATUS: dnr   Allergies  Allergen Reactions   Hydrochlorothiazide  Other (See Comments)    hypokalemia    Chief Complaint  Patient presents with   Acute Visit    Care plan meeting     HPI:  We have come together for his care plan meeting. Family present. He is wheelchair dependent without falls. He requires mod to max assist with his adl care. He is frequently incontinent of bladder and bowel. Dietary: his appetite remain poor. He requires assist with meals. He is on magic cup. He is not eating protein cookie; will start him on ensure enlive three times daily. Therapy: ambulate 90 feet with rolling walker with min assist has a shuffling gait. Transfers min assist; BRP: mod assist; upper body min assist lower body max assist.  He is not eating enough to sustain his life. We have had a detailed discussion regarding his health status; prognosis; and goals of care. We have discussed his advanced directives and have filled out his MOST form.   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   Procedure: RIGHT REVERSE SHOULDER ARTHROPLASTY;  Surgeon: Eva Herring, MD;  Location: [facility] OR;  Service: Orthopedics;  Laterality: Right;  Right reverse total shoulder    Social History   Socioeconomic History   Marital  status: Married    Spouse name: Lovel Suazo   Number of children: 2   Years of education: college   Highest education level: Some college, no degree  Occupational History   Occupation: retired    Associate Professor: RETIRED  Tobacco Use   Smoking status: Former    Current packs/day: 0.00    Average packs/day: 1.5 packs/day for 40.0 years (60.0 ttl pk-yrs)    Types: Cigarettes    Start date: 10/12/1950    Quit date: 10/12/1990    Years since quitting: 33.0    Passive exposure: Past   Smokeless tobacco: Never  Vaping Use   Vaping status: Never Used  Substance and Sexual Activity   Alcohol use: No   Drug use: No   Sexual activity: Not Currently    Partners: Female  Other Topics Concern   Not on file  Social History Narrative   Not on file   Social Drivers of Health   Financial Resource Strain: Low Risk  (09/27/2023)   Overall Financial Resource Strain (CARDIA)    Difficulty of Paying Living Expenses: Not hard at all  Food Insecurity: No Food Insecurity (09/28/2023)   Hunger Vital Sign    Worried About Running Out of Food in the Last Year: Never true    Ran Out of Food in the Last Year: Never true  Transportation Needs: No Transportation Needs (09/28/2023)   PRAPARE - Transportation  Lack of Transportation (Medical): No    Lack of Transportation (Non-Medical): No  Physical Activity: Sufficiently Active (09/27/2023)   Exercise Vital Sign    Days of Exercise per Week: 7 days    Minutes of Exercise per Session: 30 min  Recent Concern: Physical Activity - Inactive (09/27/2023)   Exercise Vital Sign    Days of Exercise per Week: 0 days    Minutes of Exercise per Session: 0 min  Stress: No Stress Concern Present (09/27/2023)   [employer] of Occupational Health - Occupational Stress Questionnaire    Feeling of Stress : Not at all  Social Connections: Moderately Integrated (09/27/2023)   Social Connection and Isolation Panel [NHANES]    Frequency of Communication with  Friends and Family: More than three times a week    Frequency of Social Gatherings with Friends and Family: More than three times a week    Attends Religious Services: 1 to 4 times per year    Active Member of [employer] or Organizations: No    Attends Banker Meetings: Never    Marital Status: Married  Catering Manager Violence: Not At Risk (09/28/2023)   Humiliation, Afraid, Rape, and Kick questionnaire    Fear of Current or Ex-Partner: No    Emotionally Abused: No    Physically Abused: No    Sexually Abused: No   Family History  Problem Relation Age of Onset   Heart disease Unknown    Asthma Unknown       VITAL SIGNS BP 103/73   Pulse (!) 113   Temp (!) 97.5 F (36.4 C)   Resp 20   Ht 5' 8 (1.727 m)   Wt 119 lb 12.8 oz (54.3 kg)   SpO2 95%   BMI 18.22 kg/m   Outpatient Encounter Medications as of 10/15/2023  Medication Sig   acetaminophen  (TYLENOL ) 325 MG tablet Take 2 tablets (650 mg total) by mouth every 6 (six) hours as needed for mild pain (pain score 1-3) (or Fever >/= 100.4).   albuterol (VENTOLIN HFA) 108 (90 Base) MCG/ACT inhaler Inhale into the lungs every 6 (six) hours as needed for wheezing or shortness of breath.   bisacodyl  (DULCOLAX) 5 MG EC tablet Take 1 tablet (5 mg total) by mouth daily as needed for moderate constipation.   calcium  carbonate (TUMS - DOSED IN MG ELEMENTAL CALCIUM ) 500 MG chewable tablet Chew 1 tablet by mouth 3 (three) times daily.   cyanocobalamin  1000 MCG tablet Take 1,000 mcg by mouth daily.   donepezil  (ARICEPT ) 10 MG tablet Take 10 mg by mouth at bedtime.   memantine  (NAMENDA ) 10 MG tablet Take 1 tablet (10 mg total) by mouth 2 (two) times daily.   mirtazapine (REMERON) 7.5 MG tablet Take 7.5 mg by mouth at bedtime.   omeprazole (PRILOSEC) 20 MG capsule Take 20 mg by mouth daily.   polyethylene glycol (MIRALAX  / GLYCOLAX ) 17 g packet Take 17 g by mouth daily as needed for mild constipation.   potassium chloride  SA (KLOR-CON   M) 20 MEQ tablet Take 20 mEq by mouth daily.   Protein (PROSOURCE PO) Take 30 mLs by mouth 3 (three) times daily.   UNABLE TO FIND Take 1 Container by mouth 3 (three) times daily with meals. Med Name: magic cup  Also protein cookie in between meals   No facility-administered encounter medications on file as of 10/15/2023.     SIGNIFICANT DIAGNOSTIC EXAMS   PREVIOUS   09-27-23: wbc 9.5; hgb 12.8; hct 39.9;  mcv 88.9 plt 193; glucose 105; bun 39; creat 1.39; k+ 3.5; na++ 145; ca 9.4 gfr 50; protein 6.2 albumin 3.0; CK 2986 09-29-23: wbc 4.1; hgb 10.4; hct 32.1; mcv 90.4 plt 167 glucose 84; bun 43; creat 1.23; k+ 3.0; na++ 141; ca 7.7; gfr 58; CK 988 10-04-23: wbc 6.4; hgb 11.8; hct 35.7; mcv 87.9 plt 286; glucose 91; bun 23; creat 0.97; k+ 3.3; na++ 142; ca 8.1; gfr >60  10-07-23: glucose 73; bun 23; creat 0.92; k+ 3.5; na=+ 136; ca 8.1; gfr >60 CK 27  NO NEW LABS.   Review of Systems  Reason unable to perform ROS: unable to fully participate.   Physical Exam Constitutional:      General: He is not in acute distress.    Appearance: He is underweight. He is not diaphoretic.  Neck:     Thyroid : No thyromegaly.  Cardiovascular:     Rate and Rhythm: Normal rate and regular rhythm.     Pulses: Normal pulses.     Heart sounds: Normal heart sounds.  Pulmonary:     Effort: Pulmonary effort is normal. No respiratory distress.     Breath sounds: Normal breath sounds.  Abdominal:     General: Bowel sounds are normal. There is no distension.     Palpations: Abdomen is soft.     Tenderness: There is no abdominal tenderness.  Musculoskeletal:        General: Normal range of motion.     Cervical back: Neck supple.     Right lower leg: No edema.     Left lower leg: No edema.  Lymphadenopathy:     Cervical: No cervical adenopathy.  Skin:    General: Skin is warm and dry.  Neurological:     Mental Status: He is alert. Mental status is at baseline.     Comments: BCAT 17/50   Psychiatric:         Mood and Affect: Mood normal.      ASSESSMENT/ PLAN:  TODAY  Aortic atherosclerosis Major neurocognitive disorder Failure to thrive in adult  MOST form filled out;  Will stop aricept  Will stop cookie Will begin ensure enlive three times daily  Will continue to monitor his status   Time spent with patient: 60 minutes: 30 minutes spent with advanced directives. The goal of his care is home versus long term placement.    Barnie Seip NP [facility]  call [phone]

## 2023-10-21 ENCOUNTER — Non-Acute Institutional Stay (SKILLED_NURSING_FACILITY): Payer: Medicare PPO | Admitting: Adult Health

## 2023-10-21 ENCOUNTER — Encounter: Payer: Self-pay | Admitting: Adult Health

## 2023-10-21 DIAGNOSIS — N1832 Chronic kidney disease, stage 3b: Secondary | ICD-10-CM

## 2023-10-21 DIAGNOSIS — I700 Atherosclerosis of aorta: Secondary | ICD-10-CM | POA: Diagnosis not present

## 2023-10-21 DIAGNOSIS — F0390 Unspecified dementia without behavioral disturbance: Secondary | ICD-10-CM | POA: Diagnosis not present

## 2023-10-21 NOTE — Progress Notes (Signed)
 Location:  [facility] Room Number: 128 Place of Service:  SNF (31)   CODE STATUS: dnr   Allergies  Allergen Reactions   Hydrochlorothiazide  Other (See Comments)    hypokalemia    Chief Complaint  Patient presents with   Acute Visit    Care plan meeting.     HPI:  We have come together for his follow up care plan meeting. Family present. BIMS 5/15 mood 0/30 BCAT 17/50. Is out of bed to wheelchair without falls since admission. He requires moderate to max assist with his adl care. He is continent of bladder and bowel. Dietary: ; has had a poor appetite; is having weight loss 117.6 pounds. Therapy: ambulate 75 feet with rolling walker with contact guard has shuffling gait ; upper body contact guard; lower body min assist;transfers supervision; step bilateral rails contact guard. stand/pivot contact guard BRP contact guard. He will continue to be followed for his chronic illnesses including:    Aortic atherosclerosis  Major neurocognitive disorder   Chronic kidney disease stage 3b   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   Procedure: RIGHT REVERSE SHOULDER ARTHROPLASTY;  Surgeon: Eva Herring, MD;  Location: [facility] OR;  Service: Orthopedics;  Laterality: Right;  Right reverse total shoulder    Social History   Socioeconomic History   Marital status: Married    Spouse name:  Joanne Brander   Number of children: 2   Years of education: college   Highest education level: Some college, no degree  Occupational History   Occupation: retired    Associate Professor: RETIRED  Tobacco Use   Smoking status: Former    Current packs/day: 0.00    Average packs/day: 1.5 packs/day for 40.0 years (60.0 ttl pk-yrs)    Types: Cigarettes    Start date: 10/12/1950    Quit date: 10/12/1990    Years since quitting: 33.0    Passive exposure: Past   Smokeless tobacco: Never  Vaping Use   Vaping status: Never Used  Substance and Sexual Activity   Alcohol use: No   Drug use: No   Sexual activity: Not Currently    Partners: Female  Other Topics Concern   Not on file  Social History Narrative   Not on file   Social Drivers of Health   Financial Resource Strain: Low Risk  (09/27/2023)   Overall Financial Resource Strain (CARDIA)    Difficulty of Paying Living Expenses: Not hard at all  Food Insecurity: No Food Insecurity (09/28/2023)   Hunger Vital Sign    Worried About Running Out of Food in the Last Year: Never true    Ran Out of Food in the Last Year: Never true  Transportation Needs: No Transportation Needs (09/28/2023)   PRAPARE - Transportation    Lack of Transportation (Medical): No    Lack  of Transportation (Non-Medical): No  Physical Activity: Sufficiently Active (09/27/2023)   Exercise Vital Sign    Days of Exercise per Week: 7 days    Minutes of Exercise per Session: 30 min  Recent Concern: Physical Activity - Inactive (09/27/2023)   Exercise Vital Sign    Days of Exercise per Week: 0 days    Minutes of Exercise per Session: 0 min  Stress: No Stress Concern Present (09/27/2023)   [employer] of Occupational Health - Occupational Stress Questionnaire    Feeling of Stress : Not at all  Social Connections: Moderately Integrated (09/27/2023)   Social Connection and Isolation Panel [NHANES]    Frequency of Communication with Friends and Family: More than three  times a week    Frequency of Social Gatherings with Friends and Family: More than three times a week    Attends Religious Services: 1 to 4 times per year    Active Member of [employer] or Organizations: No    Attends Banker Meetings: Never    Marital Status: Married  Catering Manager Violence: Not At Risk (09/28/2023)   Humiliation, Afraid, Rape, and Kick questionnaire    Fear of Current or Ex-Partner: No    Emotionally Abused: No    Physically Abused: No    Sexually Abused: No   Family History  Problem Relation Age of Onset   Heart disease Unknown    Asthma Unknown       VITAL SIGNS BP 127/60   Pulse 70   Temp (!) 96.6 F (35.9 C)   Resp 18   Ht 5' 8 (1.727 m)   Wt 117 lb 9.6 oz (53.3 kg)   SpO2 94%   BMI 17.88 kg/m   Outpatient Encounter Medications as of 10/21/2023  Medication Sig   acetaminophen  (TYLENOL ) 325 MG tablet Take 2 tablets (650 mg total) by mouth every 6 (six) hours as needed for mild pain (pain score 1-3) (or Fever >/= 100.4).   albuterol (VENTOLIN HFA) 108 (90 Base) MCG/ACT inhaler Inhale into the lungs every 6 (six) hours as needed for wheezing or shortness of breath.   bisacodyl  (DULCOLAX) 5 MG EC tablet Take 1 tablet (5 mg total) by mouth daily as needed for moderate constipation.   calcium  carbonate (TUMS - DOSED IN MG ELEMENTAL CALCIUM ) 500 MG chewable tablet Chew 1 tablet by mouth 3 (three) times daily.   cyanocobalamin  1000 MCG tablet Take 1,000 mcg by mouth daily.   memantine  (NAMENDA ) 10 MG tablet Take 1 tablet (10 mg total) by mouth 2 (two) times daily.   mirtazapine (REMERON) 7.5 MG tablet Take 7.5 mg by mouth at bedtime.   omeprazole (PRILOSEC) 20 MG capsule Take 20 mg by mouth daily.   polyethylene glycol (MIRALAX  / GLYCOLAX ) 17 g packet Take 17 g by mouth daily as needed for mild constipation.   potassium chloride  SA (KLOR-CON  M) 20 MEQ tablet Take 20 mEq by mouth daily.   Protein (PROSOURCE PO) Take 30 mLs by mouth 3 (three) times  daily.   UNABLE TO FIND Take 1 Container by mouth 3 (three) times daily with meals. Med Name: magic cup  Also protein cookie in between meals   No facility-administered encounter medications on file as of 10/21/2023.     SIGNIFICANT DIAGNOSTIC EXAMS  PREVIOUS   09-27-23: wbc 9.5; hgb 12.8; hct 39.9; mcv 88.9 plt 193; glucose 105; bun 39; creat 1.39; k+ 3.5; na++ 145; ca 9.4 gfr 50; protein 6.2 albumin 3.0; CK 2986 09-29-23:  wbc 4.1; hgb 10.4; hct 32.1; mcv 90.4 plt 167 glucose 84; bun 43; creat 1.23; k+ 3.0; na++ 141; ca 7.7; gfr 58; CK 988 10-04-23: wbc 6.4; hgb 11.8; hct 35.7; mcv 87.9 plt 286; glucose 91; bun 23; creat 0.97; k+ 3.3; na++ 142; ca 8.1; gfr >60  10-07-23: glucose 73; bun 23; creat 0.92; k+ 3.5; na=+ 136; ca 8.1; gfr >60 CK 27  NO NEW LABS.   Review of Systems  Reason unable to perform ROS: unable to fully participate.   Physical Exam Constitutional:      General: He is not in acute distress.    Appearance: He is underweight. He is not diaphoretic.  Neck:     Thyroid : No thyromegaly.  Cardiovascular:     Rate and Rhythm: Normal rate and regular rhythm.     Pulses: Normal pulses.     Heart sounds: Normal heart sounds.  Pulmonary:     Effort: Pulmonary effort is normal. No respiratory distress.     Breath sounds: Normal breath sounds.  Abdominal:     General: Bowel sounds are normal. There is no distension.     Palpations: Abdomen is soft.     Tenderness: There is no abdominal tenderness.  Musculoskeletal:        General: Normal range of motion.     Cervical back: Neck supple.  Lymphadenopathy:     Cervical: No cervical adenopathy.  Skin:    General: Skin is warm and dry.  Neurological:     Mental Status: He is alert. Mental status is at baseline.     Comments:  BCAT 17/50  Psychiatric:        Mood and Affect: Mood normal.      ASSESSMENT/ PLAN:  TODAY  Aortic atherosclerosis Major neurocognitive disorder Chronic kidney disease stage 3b    Will continue current medications Will continue therapy as directed Will continue to monitor his status GOAL of care: possible home; probably long term care.   Time spent with patient: 40 minutes: therapy; medications; adl needs.    Michael Seip NP [facility]   call [phone]

## 2023-10-22 ENCOUNTER — Non-Acute Institutional Stay (SKILLED_NURSING_FACILITY): Payer: Self-pay | Admitting: Internal Medicine

## 2023-10-22 ENCOUNTER — Ambulatory Visit: Payer: Self-pay | Admitting: *Deleted

## 2023-10-22 ENCOUNTER — Encounter: Payer: Self-pay | Admitting: Internal Medicine

## 2023-10-22 DIAGNOSIS — N1832 Chronic kidney disease, stage 3b: Secondary | ICD-10-CM | POA: Diagnosis not present

## 2023-10-22 DIAGNOSIS — G9341 Metabolic encephalopathy: Secondary | ICD-10-CM | POA: Diagnosis not present

## 2023-10-22 DIAGNOSIS — M6282 Rhabdomyolysis: Secondary | ICD-10-CM | POA: Diagnosis not present

## 2023-10-22 DIAGNOSIS — H5440 Blindness, one eye, unspecified eye: Secondary | ICD-10-CM | POA: Insufficient documentation

## 2023-10-22 NOTE — Assessment & Plan Note (Signed)
 His family noted this diagnosis on discharge instructions; I explained that this was a misdiagnosis based on isolated creatinine while hospitalized.  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  Med list reviewed; no indication for change in meds or dosages.

## 2023-10-22 NOTE — Assessment & Plan Note (Addendum)
 10/06/2024 repeat CK was 27 indicating resolution of the rhabdo.

## 2023-10-22 NOTE — Patient Instructions (Signed)
 See assessment and plan under each diagnosis in the problem list and acutely for this visit

## 2023-10-22 NOTE — Assessment & Plan Note (Signed)
 The lack of light reflex was appropriately of concern to his Nurse; the significance of her findings was explained to her and the family.

## 2023-10-22 NOTE — Assessment & Plan Note (Addendum)
 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  He was unaware of having fallen.  If family not present; he will be place near the Nurses' station in a wheelchair as he repeatedly tries to get out of the wheelchair or chair to get back into bed with risk of recurrent falls.

## 2023-10-22 NOTE — Progress Notes (Signed)
   NURSING HOME LOCATION:  [facility] ROOM NUMBER:  129 P  CODE STATUS:  DNR  PCP:  Barnie Seip NP  This is a nursing facility follow up visit for specific acute issue of fall 10/21/2023.  Neurochecks have been conducted; his Nurse questions asymmetry in the light reflex response of pupils. Interim medical record and care since last SNF visit was updated with review of diagnostic studies and change in clinical status since last visit were documented.  HPI: He was discharged to this facility on 10/01/2023 following hospitalization for acute mental status changes in the context of rhabdomyolysis in the context of mechanical fall.  This actually represented his second hospitalization as he was initially hospitalized 12/2 - 12/14 and then rehospitalized 12/16 - 12/20. Labs were most recently updated in late December 2024.  Hypocalcemia was present with a value of 8.1, serially this has improved.  There has been normalization of his CK with the current value of 27.  It had peaked at 2986 in the context of active rhabdomyolysis.  Serially his anemia has improved with H/H of 11.8/35.7.  Review of systems: Dementia invalidated responses. His BCAT MMSE score was 17/50, compatible with dementia.  His wife states that he will attempt to get out of the chair or wheelchair to get into bed without help resulting in falls.  When I asked about the circumstances of the fall , his answer was did I fall ? 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  Physical exam:  Pertinent or positive findings: He appears suboptimally nourished with temporal wasting as well as limb atrophy and interosseous wasting.  Hair is disheveled and thin over the crown.  There is ptosis greater on the left than the right.  The left pupil is slightly larger than the right.  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  Extraocular motion is intact.   S1 and S2 were increased.  Posterior tibial pulses are stronger than dorsalis pedis pulses.  His strength to opposition is equal in all extremities.  General appearance:  no acute distress, increased work of breathing is present.   Lymphatic: No lymphadenopathy about the head, neck, axilla. Eyes: No conjunctival inflammation or lid edema is present. There is no scleral icterus. Ears:  External ear exam shows no significant lesions or deformities.   Nose:  External nasal examination shows no deformity or inflammation. Nasal mucosa are pink and moist without lesions, exudates Oral exam:  Lips and gums are healthy appearing. There is no oropharyngeal erythema or exudate. Neck:  No thyromegaly, masses, tenderness noted.    Heart:  Normal rate and regular rhythm without gallop, murmur, click, rub .  Lungs: Chest clear to auscultation without wheezes, rhonchi, rales, rubs. Abdomen: Bowel sounds are normal. Abdomen is soft and nontender with no organomegaly, hernias, masses. GU: Deferred  Extremities:  No cyanosis, clubbing, edema  Neurologic exam :Balance, Rhomberg, finger to nose testing could not be completed due to clinical state Skin: Warm & dry w/o tenting. No significant lesions or rash.  See summary under each active problem in the Problem List with associated updated therapeutic plan

## 2023-10-22 NOTE — Patient Instructions (Signed)
 Visit Information  Thank you for taking time to visit with me today. Please don't hesitate to contact me if I can be of assistance to you.   Following are the goals we discussed today:   Goals Addressed             This Visit's Progress    Receive Assistance with Higher Level of Care Placement.   On track    Care Coordination Interventions:  Interventions Today    Flowsheet Row Most Recent Value  Chronic Disease   Chronic disease during today's visit Other, Chronic Kidney Disease/End Stage Renal Disease (ESRD)  [Major Neurocognitive Disorder, Dementia, Acute Metabolic Encephalopathy, Frequent Falls, Recent Fall w/ Injury, Unsteady Balance/Gait, Inability to Perform Activities of Daily Living Independently, Caregiver Fatigue, Higher Level of Care Placement Needed]  General Interventions   General Interventions Discussed/Reviewed General Interventions Discussed, Labs, Vaccines, Doctor Visits, Health Screening, Annual Foot Exam, General Interventions Reviewed, Communication with, [name], Level of Care, Horticulturist, Commercial (DME), Annual Eye Exam  [Encouraged Routine Engagement & Communication with Care Team Members.]  Labs Hgb A1c every 3 months, Kidney Function  [Encouraged Routine Labs.]  Vaccines COVID-19, Flu, Pneumonia, RSV, Shingles, Tetanus/Pertussis/Diphtheria  [Encouraged Annual Vaccinations.]  Doctor Visits Discussed/Reviewed Doctor Visits Discussed, Specialist, Doctor Visits Reviewed, Annual Wellness Visits, PCP  [Encouraged Routine Engagement & Communication with Care Team Members.]  Health Screening Bone Density, Colonoscopy, Prostate  [Encouraged Annual Health Screenings.]  Durable Medical Equipment (DME) BP Cuff, Other, Vannie Pilling, Prescription Eyeglasses.]  PCP/Specialist Visits Compliance with follow-up visit  [Encouraged Routine Engagement & Communication with Care Team Members.]  Communication with PCP/Specialists, RN, Pharmacists, Social Work   [employer] Routine Engagement & Communication with Care Team Members.]  Level of Care Adult Daycare, Air Traffic Controller, Assisted Living, Skilled Nursing Facility  [Confirmed Disinterest in Enrollment in Adult Day Care Program. Confirmed Interest in Pursusing Higher Level of Care Placement Options (I.e Skilled Nursing for Short-Term Rehabilitative Services Versus Long-Term Memory Care Assisted Living).]  Applications Medicaid, Personal Care Services  [Confirmed Disinterest in Applying for Medicaid or Special Assistance Long-Term Care Medicaid Due to Combined Income Exceeding Poverty Guidelines for State of [place] for Fiscal Year 2024/2025. Confirmed Ineligibility to Apply for Personal Care Services.]  Exercise Interventions   Exercise Discussed/Reviewed Exercise Discussed, Assistive device use and maintanence, Exercise Reviewed, Physical Activity, Weight Managment  [Patient Exercised 7 Days a Week for 30 Minutes Per Day Walking Dog with Wife Prior to Recent Fall.]  Physical Activity Discussed/Reviewed Physical Activity Discussed, Home Exercise Program (HEP), Physical Activity Reviewed, PREP, Gym, Types of exercise  [Confirmed with Wife Patient's Inability to Ambulate or Perform Activities of Daily Living Independently Since Recent Fall.]  Weight Management Weight maintenance  [Encouraged Supplemental Nutrition & Hydration.]  Education Interventions   Education Provided Provided Therapist, Sports, Provided Web-based Education, Provided Education  [Encouraged Review, Consideration & Implementation.]  Provided Verbal Education On Nutrition, Foot Care, Eye Care, Labs, Mental Health/Coping with Illness, When to see the doctor, [name], [employer], Medication, Exercise, Blood Sugar Monitoring, Applications  [Encouraged Heart-Healthy, Low Fat, Low Sodium, Supplemental Diet.]  Labs Reviewed Hgb A1c  Applications Medicaid, Personal Care Services  [Confirmed Disinterest in Applying for Medicaid or Special  Assistance Long-Term Care Medicaid Due to Combined Income Exceeding Poverty Guidelines for [place] of [place] for Fiscal Year 2024/2025. Confirmed Ineligibility to Apply for Personal Care Services.]  Mental Health Interventions   Mental Health Discussed/Reviewed Mental Health Discussed, Anxiety, Depression, Grief and Loss, Mental Health Reviewed, Substance Abuse,  Coping Strategies, Crisis, Suicide, Other  [Assessed Mental Health & Cognitive Status of Patient & Caregiver, Wife.]  Nutrition Interventions   Nutrition Discussed/Reviewed Nutrition Discussed, Adding fruits and vegetables, Increasing proteins, Nutrition Reviewed, Decreasing fats, Decreasing salt, Fluid intake, Carbohydrate meal planning, Portion sizes, Decreasing sugar intake, Supplemental nutrition  [Confirmed Patient's Lack of Desire to Eat, Drink or Consume Prescription Medications.]  Pharmacy Interventions   Pharmacy Dicussed/Reviewed Pharmacy Topics Discussed, Medications and their functions, Pharmacy Topics Reviewed, Medication Adherence, Affording Medications  [Confirmed Ability to Afford Prescription Medications, But Current Non-Compliance.]  Medication Adherence Not taking medication  [Confirmed with Wife Patient's Desire to Stop Taking Prescription Medications.]  Safety Interventions   Safety Discussed/Reviewed Safety Discussed, Safety Reviewed, Fall Risk, Home Safety  [Encouraged Routine Use of Home Safety Devices & Durable Medical Equipment. Encouraged Consideration of Home Safety Evaluation & Home Health Physical Therapy Services, as Recommended.]  Home Safety Assistive Devices, Need for home safety assessment, Refer for home visit, Refer for community resources, Contact provider for referral to PT/OT  [Agreed to Contact Primary Care Provider to Request Order for Home Health Physical Therapy Services & Referral to [facility] Agency of Choice. Agreed to Contact Primary Care Provider to Request Review & Signature of FL-2 Form for Placement  Purposes.]  Advanced Directive Interventions   Advanced Directives Discussed/Reviewed Advanced Directives Discussed, Advanced Directives Reviewed, Advanced Care Planning, End of Life  [Confirmed Initiation of Advanced Directives (Living Will & Healthcare Power of Attorney Documents) & Encouraged to Provide Hospital Staff or Primary Care Providers with Copies to Scan into Electronic Medical Record in Epic.]  End of Life Hospice, Palliative  [Encouraged Consideration of End-of-Life Care & Palliative Care Consult through [facility].]       Active Listening & Reflection Utilized. Verbalization of Feelings Encouraged. Emotional Support Provided. Acceptance & Commitment Therapy Implemented. Cognitive Behavioral Therapy Performed. CSW Collaboration with Son, Adalberto Metzgar to Confirm Patient's Continued Residence at [facility] [phone]) to Receive Short-Term Rehabilitative Services.  CSW Collaboration with Son, Diezel Mazur to Encourage Routine Engagement with Roselie Cirigliano, Licensed Clinical Social Worker with [facility] [phone]), if You Have Questions, Need Assistance, or If Additional Social Work Needs Are Identified Between Now & Our Next Follow-Up Outreach Call, Scheduled on 11/08/2023 at 3:30 PM.      Our next appointment is by telephone on 11/08/2023 at 3:30 pm.  Please call the care guide team at [phone] if you need to cancel or reschedule your appointment.   If you are experiencing a Mental Health or Behavioral Health Crisis or need someone to talk to, please call the Suicide and Crisis Lifeline: 988 call the [place]  National Suicide Prevention Lifeline: (215) 878-0626 or TTY: 905-253-1656 TTY (863)018-4377) to talk to a trained counselor call 1-800-273-TALK (toll free, 24 hour hotline) go to [facility] [address] [place] [phone]) call the [facility] Crisis  Line: (820)013-2643 call 911  Patient verbalizes understanding of instructions and care plan provided today and agrees to view in MyChart. Active MyChart status and patient understanding of how to access instructions and care plan via MyChart confirmed with patient.     Telephone follow up appointment with care management team member scheduled for:  11/08/2023 at 3:30 pm.  Kahla Risdon, BSW, MSW, LCSW  Embedded Practice Social Work Case Manager  [facility], Population Health Direct Dial: [phone]  Fax: [phone] Email: [email or web address] Website: [email or web address]

## 2023-10-22 NOTE — Patient Outreach (Signed)
 Care Coordination   Follow Up Visit Note   10/22/2023  Name: Michael [name] MRN: [id number] DOB: 1940-12-07  Michael [name] is a 83 y.o. year old male who sees Sheryle Carwin, MD for primary care. I spoke with patient's son, Juandedios Dudash by phone today.  What matters to the patients health and wellness today?  Receive Assistance with Higher Level of Care Placement.    Goals Addressed             This Visit's Progress    Receive Assistance with Higher Level of Care Placement.   On track    Care Coordination Interventions:  Interventions Today    Flowsheet Row Most Recent Value  Chronic Disease   Chronic disease during today's visit Other, Chronic Kidney Disease/End Stage Renal Disease (ESRD)  [Major Neurocognitive Disorder, Dementia, Acute Metabolic Encephalopathy, Frequent Falls, Recent Fall w/ Injury, Unsteady Balance/Gait, Inability to Perform Activities of Daily Living Independently, Caregiver Fatigue, Higher Level of Care Placement Needed]  General Interventions   General Interventions Discussed/Reviewed General Interventions Discussed, Labs, Vaccines, Doctor Visits, Health Screening, Annual Foot Exam, General Interventions Reviewed, Communication with, [name], Level of Care, Horticulturist, Commercial (DME), Annual Eye Exam  [Encouraged Routine Engagement & Communication with Care Team Members.]  Labs Hgb A1c every 3 months, Kidney Function  [Encouraged Routine Labs.]  Vaccines COVID-19, Flu, Pneumonia, RSV, Shingles, Tetanus/Pertussis/Diphtheria  [Encouraged Annual Vaccinations.]  Doctor Visits Discussed/Reviewed Doctor Visits Discussed, Specialist, Doctor Visits Reviewed, Annual Wellness Visits, PCP  [Encouraged Routine Engagement & Communication with Care Team Members.]  Health Screening Bone Density, Colonoscopy, Prostate  [Encouraged Annual Health Screenings.]  Durable Medical Equipment (DME) BP Cuff, Other, Vannie Pilling, Prescription Eyeglasses.]   PCP/Specialist Visits Compliance with follow-up visit  [Encouraged Routine Engagement & Communication with Care Team Members.]  Communication with PCP/Specialists, RN, Pharmacists, Social Work  [employer] Routine Engagement & Communication with Care Team Members.]  Level of Care Adult Daycare, Air Traffic Controller, Assisted Living, Skilled Nursing Facility  [Confirmed Disinterest in Enrollment in Adult Day Care Program. Confirmed Interest in Pursusing Higher Level of Care Placement Options (I.e Skilled Nursing for Short-Term Rehabilitative Services Versus Long-Term Memory Care Assisted Living).]  Applications Medicaid, Personal Care Services  [Confirmed Disinterest in Applying for Medicaid or Special Assistance Long-Term Care Medicaid Due to Combined Income Exceeding Poverty Guidelines for State of [place] for Fiscal Year 2024/2025. Confirmed Ineligibility to Apply for Personal Care Services.]  Exercise Interventions   Exercise Discussed/Reviewed Exercise Discussed, Assistive device use and maintanence, Exercise Reviewed, Physical Activity, Weight Managment  [Patient Exercised 7 Days a Week for 30 Minutes Per Day Walking Dog with Wife Prior to Recent Fall.]  Physical Activity Discussed/Reviewed Physical Activity Discussed, Home Exercise Program (HEP), Physical Activity Reviewed, PREP, Gym, Types of exercise  [Confirmed with Wife Patient's Inability to Ambulate or Perform Activities of Daily Living Independently Since Recent Fall.]  Weight Management Weight maintenance  [Encouraged Supplemental Nutrition & Hydration.]  Education Interventions   Education Provided Provided Therapist, Sports, Provided Web-based Education, Provided Education  [Encouraged Review, Consideration & Implementation.]  Provided Verbal Education On Nutrition, Foot Care, Eye Care, Labs, Mental Health/Coping with Illness, When to see the doctor, [name], [employer], Medication, Exercise, Blood Sugar Monitoring, Applications   [Encouraged Heart-Healthy, Low Fat, Low Sodium, Supplemental Diet.]  Labs Reviewed Hgb A1c  Applications Medicaid, Personal Care Services  [Confirmed Disinterest in Applying for Medicaid or Special Assistance Long-Term Care Medicaid Due to Combined Income Exceeding Poverty Guidelines for [place] for  Fiscal Year 2024/2025. Confirmed Ineligibility to Apply for Personal Care Services.]  Mental Health Interventions   Mental Health Discussed/Reviewed Mental Health Discussed, Anxiety, Depression, Grief and Loss, Mental Health Reviewed, Substance Abuse, Coping Strategies, Crisis, Suicide, Other  [Assessed Mental Health & Cognitive Status of Patient & Caregiver, Wife.]  Nutrition Interventions   Nutrition Discussed/Reviewed Nutrition Discussed, Adding fruits and vegetables, Increasing proteins, Nutrition Reviewed, Decreasing fats, Decreasing salt, Fluid intake, Carbohydrate meal planning, Portion sizes, Decreasing sugar intake, Supplemental nutrition  [Confirmed Patient's Lack of Desire to Eat, Drink or Consume Prescription Medications.]  Pharmacy Interventions   Pharmacy Dicussed/Reviewed Pharmacy Topics Discussed, Medications and their functions, Pharmacy Topics Reviewed, Medication Adherence, Affording Medications  [Confirmed Ability to Afford Prescription Medications, But Current Non-Compliance.]  Medication Adherence Not taking medication  [Confirmed with Wife Patient's Desire to Stop Taking Prescription Medications.]  Safety Interventions   Safety Discussed/Reviewed Safety Discussed, Safety Reviewed, Fall Risk, Home Safety  [Encouraged Routine Use of Home Safety Devices & Durable Medical Equipment. Encouraged Consideration of Home Safety Evaluation & Home Health Physical Therapy Services, as Recommended.]  Home Safety Assistive Devices, Need for home safety assessment, Refer for home visit, Refer for community resources, Contact provider for referral to PT/OT  [Agreed to Contact Primary Care Provider  to Request Order for Home Health Physical Therapy Services & Referral to [facility] Agency of Choice. Agreed to Contact Primary Care Provider to Request Review & Signature of FL-2 Form for Placement Purposes.]  Advanced Directive Interventions   Advanced Directives Discussed/Reviewed Advanced Directives Discussed, Advanced Directives Reviewed, Advanced Care Planning, End of Life  [Confirmed Initiation of Advanced Directives (Living Will & Healthcare Power of Attorney Documents) & Encouraged to Provide Hospital Staff or Primary Care Providers with Copies to Scan into Electronic Medical Record in Epic.]  End of Life Hospice, Palliative  [Encouraged Consideration of End-of-Life Care & Palliative Care Consult through [facility].]       Active Listening & Reflection Utilized. Verbalization of Feelings Encouraged. Emotional Support Provided. Acceptance & Commitment Therapy Implemented. Cognitive Behavioral Therapy Performed. CSW Collaboration with Son, Kalai Baca to Confirm Patient's Continued Residence at [facility] [phone]) to Receive Short-Term Rehabilitative Services.  CSW Collaboration with Son, Mouhamadou Gittleman to Encourage Routine Engagement with Liberti Appleton, Licensed Clinical Social Worker with [facility] [phone]), if You Have Questions, Need Assistance, or If Additional Social Work Needs Are Identified Between Now & Our Next Follow-Up Outreach Call, Scheduled on 11/08/2023 at 3:30 PM.      SDOH assessments and interventions completed:  Yes.  Care Coordination Interventions:  Yes, provided.   Follow up plan: Follow up call scheduled for 11/08/2023 at 3:30 pm.  Encounter Outcome:  Patient Visit Completed.   Philippe Desanctis, BSW, MSW, Printmaker Social Work Case Set Designer Health  [facility], Population Health Direct Dial: [phone]  Fax: [phone] Email:  [email or web address] Website: [email or web address]

## 2023-10-25 ENCOUNTER — Encounter: Payer: Medicare PPO | Admitting: *Deleted

## 2023-11-08 ENCOUNTER — Ambulatory Visit: Payer: Self-pay | Admitting: *Deleted

## 2023-11-08 NOTE — Patient Instructions (Signed)
Visit Information  Thank you for taking time to visit with me today. Please don't hesitate to contact me if I can be of assistance to you.   Following are the goals we discussed today:   Goals Addressed             This Visit's Progress    COMPLETED: Receive Assistance with Higher Level of Care Placement.   On track    Care Coordination Interventions:  Interventions Today    Flowsheet Row Most Recent Value  Chronic Disease   Chronic disease during today's visit Other, Chronic Kidney Disease/End Stage Renal Disease (ESRD)  [Major Neurocognitive Disorder, Dementia, Acute Metabolic Encephalopathy, Frequent Falls, Recent Fall w/ Injury, Unsteady Balance/Gait, Inability to Perform Activities of Daily Living Independently, Caregiver Fatigue, Higher Level of Care Placement Needed]  General Interventions   General Interventions Discussed/Reviewed General Interventions Discussed, Labs, Vaccines, Doctor Visits, Health Screening, Annual Foot Exam, General Interventions Reviewed, Communication with, [name], Level of Care, Horticulturist, commercial (DME), Annual Eye Exam  [Encouraged Routine Engagement & Communication with Care Team Members.]  Labs Hgb A1c every 3 months, Kidney Function  [Encouraged Routine Labs.]  Vaccines COVID-19, Flu, Pneumonia, RSV, Shingles, Tetanus/Pertussis/Diphtheria  [Encouraged Annual Vaccinations.]  Doctor Visits Discussed/Reviewed Doctor Visits Discussed, Specialist, Doctor Visits Reviewed, Annual Wellness Visits, PCP  [Encouraged Routine Engagement & Communication with Care Team Members.]  Health Screening Bone Density, Colonoscopy, Prostate  [Encouraged Annual Health Screenings.]  Durable Medical Equipment (DME) BP Cuff, Other, Ephraim Hamburger, Prescription Eyeglasses.]  PCP/Specialist Visits Compliance with follow-up visit  [Encouraged Routine Engagement & Communication with Care Team Members.]  Communication with PCP/Specialists, RN, Pharmacists, Social  Work  [employer] Routine Engagement & Communication with Care Team Members.]  Level of Care Adult Daycare, Air traffic controller, Assisted Living, Skilled Nursing Facility  [Confirmed Disinterest in Enrollment in Adult Day Care Program. Confirmed Interest in Pursusing Higher Level of Care Placement Options (I.e Skilled Nursing for Short-Term Rehabilitative Services Versus Long-Term Memory Care Assisted Living).]  Applications Medicaid, Personal Care Services  [Confirmed Disinterest in Applying for Medicaid or Special Assistance Long-Term Care Medicaid Due to Combined Income Exceeding Poverty Guidelines for [place] of [place] for Fiscal Year 2024/2025. Confirmed Ineligibility to Apply for Personal Care Services.]  Exercise Interventions   Exercise Discussed/Reviewed Exercise Discussed, Assistive device use and maintanence, Exercise Reviewed, Physical Activity, Weight Managment  [Patient Exercised 7 Days a Week for 30 Minutes Per Day Walking Dog with Wife Prior to Recent Fall.]  Physical Activity Discussed/Reviewed Physical Activity Discussed, Home Exercise Program (HEP), Physical Activity Reviewed, PREP, Gym, Types of exercise  [Confirmed with Wife Patient's Inability to Ambulate or Perform Activities of Daily Living Independently Since Recent Fall.]  Weight Management Weight maintenance  [Encouraged Supplemental Nutrition & Hydration.]  Education Interventions   Education Provided Provided Therapist, sports, Provided Web-based Education, Provided Education  [Encouraged Review, Consideration & Implementation.]  Provided Verbal Education On Nutrition, Foot Care, Eye Care, Labs, Mental Health/Coping with Illness, When to see the doctor, [name], [employer], Medication, Exercise, Blood Sugar Monitoring, Applications  [Encouraged Heart-Healthy, Low Fat, Low Sodium, Supplemental Diet.]  Labs Reviewed Hgb A1c  Applications Medicaid, Personal Care Services  [Confirmed Disinterest in Applying for Medicaid or  Special Assistance Long-Term Care Medicaid Due to Combined Income Exceeding Poverty Guidelines for [place] for Fiscal Year 2024/2025. Confirmed Ineligibility to Apply for Personal Care Services.]  Mental Health Interventions   Mental Health Discussed/Reviewed Mental Health Discussed, Anxiety, Depression, Grief and Loss, Mental Health Reviewed, Substance  Abuse, Coping Strategies, Crisis, Suicide, Other  [Assessed Mental Health & Cognitive Status of Patient & Caregiver, Wife.]  Nutrition Interventions   Nutrition Discussed/Reviewed Nutrition Discussed, Adding fruits and vegetables, Increasing proteins, Nutrition Reviewed, Decreasing fats, Decreasing salt, Fluid intake, Carbohydrate meal planning, Portion sizes, Decreasing sugar intake, Supplemental nutrition  [Confirmed Patient's Lack of Desire to Eat, Drink or Consume Prescription Medications.]  Pharmacy Interventions   Pharmacy Dicussed/Reviewed Pharmacy Topics Discussed, Medications and their functions, Pharmacy Topics Reviewed, Medication Adherence, Affording Medications  [Confirmed Ability to Afford Prescription Medications, But Current Non-Compliance.]  Medication Adherence Not taking medication  [Confirmed with Wife Patient's Desire to Stop Taking Prescription Medications.]  Safety Interventions   Safety Discussed/Reviewed Safety Discussed, Safety Reviewed, Fall Risk, Home Safety  [Encouraged Routine Use of Home Safety Devices & Durable Medical Equipment. Encouraged Consideration of Home Safety Evaluation & Home Health Physical Therapy Services, as Recommended.]  Home Safety Assistive Devices, Need for home safety assessment, Refer for home visit, Refer for community resources, Contact provider for referral to PT/OT  [Agreed to Contact Primary Care Provider to Request Order for Home Health Physical Therapy Services & Referral to [facility] Agency of Choice. Agreed to Contact Primary Care Provider to Request Review & Signature of FL-2 Form for  Placement Purposes.]  Advanced Directive Interventions   Advanced Directives Discussed/Reviewed Advanced Directives Discussed, Advanced Directives Reviewed, Advanced Care Planning, End of Life  [Confirmed Initiation of Advanced Directives (Living Will & Healthcare Power of Attorney Documents) & Encouraged to Provide Hospital Staff or Primary Care Providers with Copies to Scan into Electronic Medical Record in Epic.]  End of Life Hospice, Palliative  [Encouraged Consideration of End-of-Life Care & Palliative Care Consult through [facility].]      CSW Collaboration with Son, Phil Michels to Encourage Engagement with Danford Bad, Licensed Clinical Social Worker with [facility], [facility] [phone]), if He Has Questions, Needs Assistance, Additional Social Work Needs Are Identified in The Near Future, or If You Change Your Mind About Wanting to Receive Social Work [employer].       Please call the care guide team at [phone] if you need to cancel or reschedule your appointment.   If you are experiencing a Mental Health or Behavioral Health Crisis or need someone to talk to, please call the Suicide and Crisis Lifeline: 988 call the [place] National Suicide Prevention Lifeline: 337-503-9505 or TTY: (317)432-2785 TTY 501-806-4679) to talk to a trained counselor call 1-800-273-TALK (toll free, 24 hour hotline) go to [facility] [address] [place] [phone]) call the [facility] Crisis Line: [phone] call 911  Patient verbalizes understanding of instructions and care plan provided today and agrees to view in MyChart. Active MyChart status and patient understanding of how to access instructions and care plan via MyChart confirmed with patient.     No further follow up required.  Danford Bad, BSW, MSW, LCSW [facility], [facility] Clinical Social Worker II Direct Dial: [phone]  Fax: [phone] Website: [email or web address]

## 2023-11-08 NOTE — Patient Outreach (Signed)
Care Coordination   Follow Up Visit Note   11/08/2023  Name: Michael [name] MRN: [id number] DOB: 1941/09/19  Michael [name] is a 83 y.o. year old male who sees Michael Perches, MD for primary care. I spoke with patient's son, Michael [name] by phone today.  What matters to the patients health and wellness today?   Receive Assistance with Higher Level of Care Placement.   Goals Addressed             This Visit's Progress    COMPLETED: Receive Assistance with Higher Level of Care Placement.   On track    Care Coordination Interventions:  Interventions Today    Flowsheet Row Most Recent Value  Chronic Disease   Chronic disease during today's visit Other, Chronic Kidney Disease/End Stage Renal Disease (ESRD)  [Major Neurocognitive Disorder, Dementia, Acute Metabolic Encephalopathy, Frequent Falls, Recent Fall w/ Injury, Unsteady Balance/Gait, Inability to Perform Activities of Daily Living Independently, Caregiver Fatigue, Higher Level of Care Placement Needed]  General Interventions   General Interventions Discussed/Reviewed General Interventions Discussed, Labs, Vaccines, Doctor Visits, Health Screening, Annual Foot Exam, General Interventions Reviewed, Communication with, Michael [name], Level of Care, Horticulturist, commercial (DME), Annual Eye Exam  [Encouraged Routine Engagement & Communication with Care Team Members.]  Labs Hgb A1c every 3 months, Kidney Function  [Encouraged Routine Labs.]  Vaccines COVID-19, Flu, Pneumonia, RSV, Shingles, Tetanus/Pertussis/Diphtheria  [Encouraged Annual Vaccinations.]  Doctor Visits Discussed/Reviewed Doctor Visits Discussed, Specialist, Doctor Visits Reviewed, Annual Wellness Visits, PCP  [Encouraged Routine Engagement & Communication with Care Team Members.]  Health Screening Bone Density, Colonoscopy, Prostate  [Encouraged Annual Health Screenings.]  Durable Medical Equipment (DME) BP Cuff, Other, Michael [name], Prescription Eyeglasses.]   PCP/Specialist Visits Compliance with follow-up visit  [Encouraged Routine Engagement & Communication with Care Team Members.]  Communication with PCP/Specialists, RN, Pharmacists, Social Work  [employer] Routine Engagement & Communication with Care Team Members.]  Level of Care Adult Daycare, Air traffic controller, Assisted Living, Skilled Nursing Facility  [Confirmed Disinterest in Enrollment in Adult Day Care Program. Confirmed Interest in Pursusing Higher Level of Care Placement Options (I.e Skilled Nursing for Short-Term Rehabilitative Services Versus Long-Term Memory Care Assisted Living).]  Applications Medicaid, Personal Care Services  [Confirmed Disinterest in Applying for Medicaid or Special Assistance Long-Term Care Medicaid Due to Combined Income Exceeding Poverty Guidelines for [place] of [place] for Fiscal Year 2024/2025. Confirmed Ineligibility to Apply for Personal Care Services.]  Exercise Interventions   Exercise Discussed/Reviewed Exercise Discussed, Assistive device use and maintanence, Exercise Reviewed, Physical Activity, Weight Managment  [Patient Exercised 7 Days a Week for 30 Minutes Per Day Walking Dog with Wife Prior to Recent Fall.]  Physical Activity Discussed/Reviewed Physical Activity Discussed, Home Exercise Program (HEP), Physical Activity Reviewed, PREP, Gym, Types of exercise  [Confirmed with Wife Patient's Inability to Ambulate or Perform Activities of Daily Living Independently Since Recent Fall.]  Weight Management Weight maintenance  [Encouraged Supplemental Nutrition & Hydration.]  Education Interventions   Education Provided Provided Therapist, sports, Provided Web-based Education, Provided Education  [Encouraged Review, Consideration & Implementation.]  Provided Verbal Education On Nutrition, Foot Care, Eye Care, Labs, Mental Health/Coping with Illness, When to see the doctor, Michael [name], [employer], Medication, Exercise, Blood Sugar Monitoring, Applications   [Encouraged Heart-Healthy, Low Fat, Low Sodium, Supplemental Diet.]  Labs Reviewed Hgb A1c  Applications Medicaid, Personal Care Services  [Confirmed Disinterest in Applying for Medicaid or Special Assistance Long-Term Care Medicaid Due to Combined Income Exceeding Poverty Guidelines for [place] of [place]  for Fiscal Year 2024/2025. Confirmed Ineligibility to Apply for Personal Care Services.]  Mental Health Interventions   Mental Health Discussed/Reviewed Mental Health Discussed, Anxiety, Depression, Grief and Loss, Mental Health Reviewed, Substance Abuse, Coping Strategies, Crisis, Suicide, Other  [Assessed Mental Health & Cognitive Status of Patient & Caregiver, Wife.]  Nutrition Interventions   Nutrition Discussed/Reviewed Nutrition Discussed, Adding fruits and vegetables, Increasing proteins, Nutrition Reviewed, Decreasing fats, Decreasing salt, Fluid intake, Carbohydrate meal planning, Portion sizes, Decreasing sugar intake, Supplemental nutrition  [Confirmed Patient's Lack of Desire to Eat, Drink or Consume Prescription Medications.]  Pharmacy Interventions   Pharmacy Dicussed/Reviewed Pharmacy Topics Discussed, Medications and their functions, Pharmacy Topics Reviewed, Medication Adherence, Affording Medications  [Confirmed Ability to Afford Prescription Medications, But Current Non-Compliance.]  Medication Adherence Not taking medication  [Confirmed with Wife Patient's Desire to Stop Taking Prescription Medications.]  Safety Interventions   Safety Discussed/Reviewed Safety Discussed, Safety Reviewed, Fall Risk, Home Safety  [Encouraged Routine Use of Home Safety Devices & Durable Medical Equipment. Encouraged Consideration of Home Safety Evaluation & Home Health Physical Therapy Services, as Recommended.]  Home Safety Assistive Devices, Need for home safety assessment, Refer for home visit, Refer for community resources, Contact provider for referral to PT/OT  [Agreed to Contact Primary Care Provider  to Request Order for Home Health Physical Therapy Services & Referral to [facility] Agency of Choice. Agreed to Contact Primary Care Provider to Request Review & Signature of FL-2 Form for Placement Purposes.]  Advanced Directive Interventions   Advanced Directives Discussed/Reviewed Advanced Directives Discussed, Advanced Directives Reviewed, Advanced Care Planning, End of Life  [Confirmed Initiation of Advanced Directives (Living Will & Healthcare Power of Attorney Documents) & Encouraged to Provide Hospital Staff or Primary Care Providers with Copies to Scan into Electronic Medical Record in Michael.]  End of Life Hospice, Palliative  [Encouraged Consideration of End-of-Life Care & Palliative Care Consult through [facility].]      CSW Collaboration with Son, Draylen Lobue to Encourage Engagement with Danford Bad, Licensed Clinical Social Worker with [facility], [facility] [phone]), if He Has Questions, Needs Assistance, Additional Social Work Needs Are Identified in The Near Future, or If You Change Your Mind About Wanting to Receive Social Work [employer].       SDOH assessments and interventions completed:  Yes.  Care Coordination Interventions:  Yes, provided.   Follow up plan: No further intervention required.   Encounter Outcome:  Patient Visit Completed.   Danford Bad, BSW, MSW, LCSW [facility], [facility] Clinical Social Worker II Direct Dial: [phone]  Fax: [phone] Website: [email or web address]

## 2023-11-09 ENCOUNTER — Encounter: Payer: Self-pay | Admitting: Adult Health

## 2023-11-09 ENCOUNTER — Non-Acute Institutional Stay (SKILLED_NURSING_FACILITY): Payer: Self-pay | Admitting: Adult Health

## 2023-11-09 DIAGNOSIS — E440 Moderate protein-calorie malnutrition: Secondary | ICD-10-CM | POA: Diagnosis not present

## 2023-11-09 DIAGNOSIS — F02B0 Dementia in other diseases classified elsewhere, moderate, without behavioral disturbance, psychotic disturbance, mood disturbance, and anxiety: Secondary | ICD-10-CM

## 2023-11-09 DIAGNOSIS — M47817 Spondylosis without myelopathy or radiculopathy, lumbosacral region: Secondary | ICD-10-CM

## 2023-11-09 DIAGNOSIS — N1832 Chronic kidney disease, stage 3b: Secondary | ICD-10-CM | POA: Diagnosis not present

## 2023-11-09 DIAGNOSIS — I700 Atherosclerosis of aorta: Secondary | ICD-10-CM | POA: Diagnosis not present

## 2023-11-09 DIAGNOSIS — F0390 Unspecified dementia without behavioral disturbance: Secondary | ICD-10-CM

## 2023-11-09 NOTE — Progress Notes (Unsigned)
Location:  [facility] Room Number: 129 Place of Service:  SNF (31)   CODE STATUS: dnr   Allergies  Allergen Reactions  . Hydrochlorothiazide Other (See Comments)    hypokalemia    Chief Complaint  Patient presents with  . 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 There are no reports of uncontrolled pain.   Past Medical History:  Diagnosis Date  . Coronary artery disease    does not see a cardiologist, reports no cardiac symptoms  . Hyperlipidemia   . Hypertension   . Seasonal allergies     Past Surgical History:  Procedure Laterality Date  . BACK SURGERY     fusion,rods and cage  . CATARACT EXTRACTION W/ INTRAOCULAR LENS IMPLANT Bilateral    per wife  . COLONOSCOPY  07/31/2011   Procedure: COLONOSCOPY;  Surgeon: Dalia Heading;  Location: [facility];  Service: Gastroenterology;  Laterality: N/A;  . RETINAL DETACHMENT SURGERY Left    wife believes it was in the left eye  . 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   Procedure: RIGHT REVERSE SHOULDER ARTHROPLASTY;  Surgeon: Jones Broom, MD;  Location: [facility] OR;  Service: Orthopedics;  Laterality: Right;  Right reverse total shoulder    Social History   Socioeconomic History  . Marital status: Married    Spouse name: Azel Gumina  . Number of children: 2  . Years of education: college  . Highest  education level: Some college, no degree  Occupational History  . Occupation: retired    Associate Professor: RETIRED  Tobacco Use  . Smoking status: Former    Current packs/day: 0.00    Average packs/day: 1.5 packs/day for 40.0 years (60.0 ttl pk-yrs)    Types: Cigarettes    Start date: 10/12/1950    Quit date: 10/12/1990    Years since quitting: 33.0    Passive exposure: Past  . Smokeless tobacco: Never  Vaping Use  . Vaping status: Never Used  Substance and Sexual Activity  . Alcohol use: No  . Drug use: No  . Sexual activity: Not Currently    Partners: Female  Other Topics Concern  . Not on file  Social History Narrative  . Not on file   Social Drivers of Health   Financial Resource Strain: Low Risk  (09/27/2023)   Overall Financial Resource Strain (CARDIA)   . Difficulty of Paying Living Expenses: Not hard at all  Food Insecurity: No Food Insecurity (09/28/2023)   Hunger Vital Sign   . Worried About Programme researcher, broadcasting/film/video in the Last Year: Never true   . Ran Out of Food in the Last Year: Never true  Transportation Needs: No Transportation Needs (09/28/2023)   PRAPARE - Transportation   . Lack of Transportation (Medical): No   . Lack of Transportation (Non-Medical): No  Physical Activity: Sufficiently Active (09/27/2023)   Exercise Vital Sign   . Days of Exercise per Week: 7 days   . Minutes  of Exercise per Session: 30 min  Recent Concern: Physical Activity - Inactive (09/27/2023)   Exercise Vital Sign   . Days of Exercise per Week: 0 days   . Minutes of Exercise per Session: 0 min  Stress: No Stress Concern Present (09/27/2023)   [employer] of Occupational Health - Occupational Stress Questionnaire   . Feeling of Stress : Not at all  Social Connections: Moderately Integrated (09/27/2023)   Social Connection and Isolation Panel [NHANES]   . Frequency of Communication with Friends and Family: More than three times a week   . Frequency of Social Gatherings with Friends and  Family: More than three times a week   . Attends Religious Services: 1 to 4 times per year   . Active Member of Clubs or Organizations: No   . Attends Banker Meetings: Never   . Marital Status: Married  Catering manager Violence: Not At Risk (09/28/2023)   Humiliation, Afraid, Rape, and Kick questionnaire   . Fear of Current or Ex-Partner: No   . Emotionally Abused: No   . Physically Abused: No   . Sexually Abused: No   Family History  Problem Relation Age of Onset  . Heart disease Unknown   . Asthma Unknown       VITAL SIGNS BP 124/76   Pulse 84   Temp 97.7 F (36.5 C)   Resp 18   Ht 5\' 8"  (1.727 m)   Wt 119 lb 6.4 oz (54.2 kg)   SpO2 97%   BMI 18.15 kg/m   Outpatient Encounter Medications as of 11/09/2023  Medication Sig  . acetaminophen (TYLENOL) 325 MG tablet Take 2 tablets (650 mg total) by mouth every 6 (six) hours as needed for mild pain (pain score 1-3) (or Fever >/= 100.4).  Marland Kitchen albuterol (VENTOLIN HFA) 108 (90 Base) MCG/ACT inhaler Inhale into the lungs every 6 (six) hours as needed for wheezing or shortness of breath.  . bisacodyl (DULCOLAX) 5 MG EC tablet Take 1 tablet (5 mg total) by mouth daily as needed for moderate constipation.  . calcium carbonate (TUMS - DOSED IN MG ELEMENTAL CALCIUM) 500 MG chewable tablet Chew 1 tablet by mouth 3 (three) times daily.  . cyanocobalamin 1000 MCG tablet Take 1,000 mcg by mouth daily.  . memantine (NAMENDA) 10 MG tablet Take 1 tablet (10 mg total) by mouth 2 (two) times daily.  Marland Kitchen omeprazole (PRILOSEC) 20 MG capsule Take 20 mg by mouth daily.  . polyethylene glycol (MIRALAX / GLYCOLAX) 17 g packet Take 17 g by mouth daily as needed for mild constipation.  . potassium chloride SA (KLOR-CON M) 20 MEQ tablet Take 20 mEq by mouth 2 (two) times daily.  . Protein (PROSOURCE PO) Take 30 mLs by mouth 3 (three) times daily.  Marland Kitchen UNABLE TO FIND Take 1 Container by mouth 3 (three) times daily with meals. Med Name: magic cup   Also protein cookie in between meals  . [DISCONTINUED] mirtazapine (REMERON) 7.5 MG tablet Take 7.5 mg by mouth at bedtime.   No facility-administered encounter medications on file as of 11/09/2023.     SIGNIFICANT DIAGNOSTIC EXAMS  PREVIOUS   09-27-23: wbc 9.5; hgb 12.8; hct 39.9; mcv 88.9 plt 193; glucose 105; bun 39; creat 1.39; k+ 3.5; na++ 145; ca 9.4 gfr 50; protein 6.2 albumin 3.0; CK 2986 09-29-23: wbc 4.1; hgb 10.4; hct 32.1; mcv 90.4 plt 167 glucose 84; bun 43; creat 1.23; k+ 3.0; na++ 141; ca 7.7; gfr 58; CK 988  10-04-23: wbc 6.4; hgb 11.8; hct 35.7; mcv 87.9 plt 286; glucose 91; bun 23; creat 0.97; k+ 3.3; na++ 142; ca 8.1; gfr >60  10-07-23: glucose 73; bun 23; creat 0.92; k+ 3.5; na=+ 136; ca 8.1; gfr >60 CK 27  NO NEW LABS.    Review of Systems  Unable to perform ROS: Dementia    Physical Exam Constitutional:      General: He is not in acute distress.    Appearance: He is underweight. He is not diaphoretic.  Neck:     Thyroid: No thyromegaly.  Cardiovascular:     Rate and Rhythm: Normal rate and regular rhythm.     Heart sounds: Normal heart sounds.  Pulmonary:     Effort: Pulmonary effort is normal. No respiratory distress.     Breath sounds: Normal breath sounds.  Abdominal:     General: Bowel sounds are normal. There is no distension.     Palpations: Abdomen is soft.     Tenderness: There is no abdominal tenderness.  Musculoskeletal:        General: Normal range of motion.     Cervical back: Neck supple.     Right lower leg: No edema.     Left lower leg: No edema.  Lymphadenopathy:     Cervical: No cervical adenopathy.  Skin:    General: Skin is warm and dry.  Neurological:     Mental Status: He is alert. Mental status is at baseline.     Comments:  BCAT 17/50   Psychiatric:        Mood and Affect: Mood normal.     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 Weight loss is an unfortunate but expected outcome . Will continue namenda 10 mg twice daily . He completes his remeron tonight for his weight loss   2. Lumbosacral spondylosis without myelopathy: is presently without uncontrolled pain; will monitor   3. Chronic kidney disease stage 3b: bun 23; creat 0.97 gfr >60   PREVIOUS   4. Failure to thrive in adult: has poor appetite and fluid intake weight is 119 pounds   5. Hypokalemia: k+ 3.5 will continue  20 meq twice daily   6. Aortic atherosclerosis (cr 09-23-23) is not on statin due to advanced age  58. Primary hypertension: b/p 124/76 will monitor   8. Chronic non-seasonal allergic rhinitis: will continue xyzal 5 mg daily   11. GERD without esophagitis: will continue  prilosec 20 mg daily   12. Chronic constipation: will continue miralax daily  as needed and dulcolax 5 mg po daily as needed   13. Hypocalcemia: will continue tums three times daily       Synthia Innocent NP [facility]  call [phone]

## 2023-11-26 DIAGNOSIS — L602 Onychogryphosis: Secondary | ICD-10-CM | POA: Diagnosis not present

## 2023-11-26 DIAGNOSIS — L84 Corns and callosities: Secondary | ICD-10-CM | POA: Diagnosis not present

## 2023-11-26 DIAGNOSIS — I739 Peripheral vascular disease, unspecified: Secondary | ICD-10-CM | POA: Diagnosis not present

## 2023-11-26 DIAGNOSIS — L603 Nail dystrophy: Secondary | ICD-10-CM | POA: Diagnosis not present

## 2023-11-29 ENCOUNTER — Other Ambulatory Visit (HOSPITAL_COMMUNITY)
Admission: RE | Admit: 2023-11-29 | Discharge: 2023-11-29 | Disposition: A | Discharge status: Home / Self Care | Payer: Medicare PPO | Source: Skilled Nursing Facility | Attending: Adult Health | Admitting: Adult Health

## 2023-11-29 DIAGNOSIS — N1832 Chronic kidney disease, stage 3b: Secondary | ICD-10-CM | POA: Diagnosis not present

## 2023-11-29 LAB — BASIC METABOLIC PANEL
Anion gap: 8 (ref 5–15)
BUN: 25 mg/dL — ABNORMAL HIGH (ref 8–23)
CO2: 27 mmol/L (ref 22–32)
Calcium: 9.2 mg/dL (ref 8.9–10.3)
Chloride: 103 mmol/L (ref 98–111)
Creatinine, Ser: 0.95 mg/dL (ref 0.61–1.24)
GFR, Estimated: >60 mL/min (ref >60)
Glucose, Bld: 96 mg/dL (ref 70–99)
Potassium: 3.8 mmol/L (ref 3.5–5.1)
Sodium: 138 mmol/L (ref 135–145)

## 2023-12-22 DIAGNOSIS — R498 Other voice and resonance disorders: Secondary | ICD-10-CM | POA: Diagnosis not present

## 2023-12-22 DIAGNOSIS — G301 Alzheimer's disease with late onset: Secondary | ICD-10-CM | POA: Diagnosis not present

## 2023-12-22 DIAGNOSIS — R1312 Dysphagia, oropharyngeal phase: Secondary | ICD-10-CM | POA: Diagnosis not present

## 2023-12-22 DIAGNOSIS — R488 Other symbolic dysfunctions: Secondary | ICD-10-CM | POA: Diagnosis not present

## 2023-12-23 DIAGNOSIS — G301 Alzheimer's disease with late onset: Secondary | ICD-10-CM | POA: Diagnosis not present

## 2023-12-23 DIAGNOSIS — R488 Other symbolic dysfunctions: Secondary | ICD-10-CM | POA: Diagnosis not present

## 2023-12-23 DIAGNOSIS — R1312 Dysphagia, oropharyngeal phase: Secondary | ICD-10-CM | POA: Diagnosis not present

## 2023-12-23 DIAGNOSIS — R498 Other voice and resonance disorders: Secondary | ICD-10-CM | POA: Diagnosis not present

## 2023-12-24 DIAGNOSIS — R1312 Dysphagia, oropharyngeal phase: Secondary | ICD-10-CM | POA: Diagnosis not present

## 2023-12-24 DIAGNOSIS — R488 Other symbolic dysfunctions: Secondary | ICD-10-CM | POA: Diagnosis not present

## 2023-12-24 DIAGNOSIS — R498 Other voice and resonance disorders: Secondary | ICD-10-CM | POA: Diagnosis not present

## 2023-12-24 DIAGNOSIS — G301 Alzheimer's disease with late onset: Secondary | ICD-10-CM | POA: Diagnosis not present

## 2023-12-27 DIAGNOSIS — G301 Alzheimer's disease with late onset: Secondary | ICD-10-CM | POA: Diagnosis not present

## 2023-12-27 DIAGNOSIS — R1312 Dysphagia, oropharyngeal phase: Secondary | ICD-10-CM | POA: Diagnosis not present

## 2023-12-27 DIAGNOSIS — R498 Other voice and resonance disorders: Secondary | ICD-10-CM | POA: Diagnosis not present

## 2023-12-27 DIAGNOSIS — R488 Other symbolic dysfunctions: Secondary | ICD-10-CM | POA: Diagnosis not present

## 2023-12-28 DIAGNOSIS — G301 Alzheimer's disease with late onset: Secondary | ICD-10-CM | POA: Diagnosis not present

## 2023-12-28 DIAGNOSIS — R488 Other symbolic dysfunctions: Secondary | ICD-10-CM | POA: Diagnosis not present

## 2023-12-28 DIAGNOSIS — R498 Other voice and resonance disorders: Secondary | ICD-10-CM | POA: Diagnosis not present

## 2023-12-28 DIAGNOSIS — R1312 Dysphagia, oropharyngeal phase: Secondary | ICD-10-CM | POA: Diagnosis not present

## 2023-12-30 DIAGNOSIS — G301 Alzheimer's disease with late onset: Secondary | ICD-10-CM | POA: Diagnosis not present

## 2023-12-30 DIAGNOSIS — R1312 Dysphagia, oropharyngeal phase: Secondary | ICD-10-CM | POA: Diagnosis not present

## 2023-12-30 DIAGNOSIS — R488 Other symbolic dysfunctions: Secondary | ICD-10-CM | POA: Diagnosis not present

## 2023-12-30 DIAGNOSIS — R498 Other voice and resonance disorders: Secondary | ICD-10-CM | POA: Diagnosis not present

## 2023-12-31 DIAGNOSIS — R498 Other voice and resonance disorders: Secondary | ICD-10-CM | POA: Diagnosis not present

## 2023-12-31 DIAGNOSIS — R488 Other symbolic dysfunctions: Secondary | ICD-10-CM | POA: Diagnosis not present

## 2023-12-31 DIAGNOSIS — G301 Alzheimer's disease with late onset: Secondary | ICD-10-CM | POA: Diagnosis not present

## 2023-12-31 DIAGNOSIS — R1312 Dysphagia, oropharyngeal phase: Secondary | ICD-10-CM | POA: Diagnosis not present

## 2024-01-03 DIAGNOSIS — R1312 Dysphagia, oropharyngeal phase: Secondary | ICD-10-CM | POA: Diagnosis not present

## 2024-01-03 DIAGNOSIS — R498 Other voice and resonance disorders: Secondary | ICD-10-CM | POA: Diagnosis not present

## 2024-01-03 DIAGNOSIS — R488 Other symbolic dysfunctions: Secondary | ICD-10-CM | POA: Diagnosis not present

## 2024-01-03 DIAGNOSIS — G301 Alzheimer's disease with late onset: Secondary | ICD-10-CM | POA: Diagnosis not present

## 2024-01-04 DIAGNOSIS — R488 Other symbolic dysfunctions: Secondary | ICD-10-CM | POA: Diagnosis not present

## 2024-01-04 DIAGNOSIS — R498 Other voice and resonance disorders: Secondary | ICD-10-CM | POA: Diagnosis not present

## 2024-01-04 DIAGNOSIS — G301 Alzheimer's disease with late onset: Secondary | ICD-10-CM | POA: Diagnosis not present

## 2024-01-04 DIAGNOSIS — R1312 Dysphagia, oropharyngeal phase: Secondary | ICD-10-CM | POA: Diagnosis not present

## 2024-01-06 DIAGNOSIS — G301 Alzheimer's disease with late onset: Secondary | ICD-10-CM | POA: Diagnosis not present

## 2024-01-06 DIAGNOSIS — R1312 Dysphagia, oropharyngeal phase: Secondary | ICD-10-CM | POA: Diagnosis not present

## 2024-01-06 DIAGNOSIS — R498 Other voice and resonance disorders: Secondary | ICD-10-CM | POA: Diagnosis not present

## 2024-01-06 DIAGNOSIS — R488 Other symbolic dysfunctions: Secondary | ICD-10-CM | POA: Diagnosis not present

## 2024-01-13 ENCOUNTER — Non-Acute Institutional Stay (SKILLED_NURSING_FACILITY): Payer: Self-pay | Admitting: Internal Medicine

## 2024-01-13 ENCOUNTER — Encounter: Payer: Self-pay | Admitting: Internal Medicine

## 2024-01-13 DIAGNOSIS — R627 Adult failure to thrive: Secondary | ICD-10-CM

## 2024-01-13 DIAGNOSIS — D518 Other vitamin B12 deficiency anemias: Secondary | ICD-10-CM

## 2024-01-13 DIAGNOSIS — F0390 Unspecified dementia without behavioral disturbance: Secondary | ICD-10-CM

## 2024-01-13 DIAGNOSIS — D519 Vitamin B12 deficiency anemia, unspecified: Secondary | ICD-10-CM | POA: Insufficient documentation

## 2024-01-13 DIAGNOSIS — N1832 Chronic kidney disease, stage 3b: Secondary | ICD-10-CM

## 2024-01-13 NOTE — Assessment & Plan Note (Signed)
 11/29/2023 creatinine 0.95 EGFR greater than 60 indicating CKD stage II which is stable.  There was a minimal component of prerenal azotemia with a BUN of 25.  Encouraged p.o. hydration to thirst.

## 2024-01-13 NOTE — Patient Instructions (Signed)
 See assessment and plan under each diagnosis in the problem list and acutely for this visit

## 2024-01-13 NOTE — Assessment & Plan Note (Signed)
 10/04/23 H/H 11.8/35.7 with normochromic, normocytic indices.  Serially this has been relatively stable.  B12 level was 146.  It will be updated.

## 2024-01-13 NOTE — Assessment & Plan Note (Signed)
 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

## 2024-01-13 NOTE — Progress Notes (Unsigned)
 NURSING HOME LOCATION:  [facility] ROOM NUMBER: [id number]  CODE STATUS: DNR  PCP: Sharee Holster, NP   This is a nursing facility follow up visit  of chronic medical diagnoses to document compliance with Regulation 483.30 (c) in The Long Term Care Survey Manual Phase 2 which mandates caregiver visit ( visits can alternate among physician, PA or NP as per statutes) within 10 days of 30 days / 60 days/ 90 days post admission to SNF date  .  Interim medical record and care since last SNF visit was updated with review of diagnostic studies and change in clinical status since last visit were documented.  HPI: He is a permanent resident of this facility with medical diagnoses of B12 deficiency, GERD, essential hypertension, dyslipidemia, seasonal allergies,dementia,  and CAD. Labs are current as of 11/29/2023.  There was minimal prerenal azotemia with a BUN of 25.  Creatinine was 0.95 and GFR greater than 60 indicating CKD stage II.  CBC was most recently checked 10/04/2023 and revealed H/H of 11.8/35.7 with normochromic, normocytic indices.  Serially the anemia was relatively stable.  TSH was low normal at that time with a value of 0.544.  He is not on thyroid supplement.  B12 level was 146; it has not been updated.  Review of systems: Dementia invalidated responses.  He denied any active symptoms.  Constitutional: No fever, significant weight change, fatigue  Eyes: No redness, discharge, pain, vision change ENT/mouth: No nasal congestion,  purulent discharge, earache, change in hearing, sore throat  Cardiovascular: No chest pain, palpitations, paroxysmal nocturnal dyspnea, claudication, edema  Respiratory: No cough, sputum production, hemoptysis, DOE, significant snoring, apnea   Gastrointestinal: No heartburn, dysphagia, abdominal pain, nausea /vomiting, rectal bleeding, melena, change in bowels Genitourinary: No dysuria, hematuria, pyuria, incontinence,  nocturia Musculoskeletal: No joint stiffness, joint swelling, weakness, pain Dermatologic: No rash, pruritus, change in appearance of skin Neurologic: No dizziness, headache, syncope, seizures, numbness, tingling Psychiatric: No significant anxiety, depression, insomnia, anorexia Endocrine: No change in hair/skin/nails, excessive thirst, excessive hunger, excessive urination  Hematologic/lymphatic: No significant bruising, lymphadenopathy, abnormal bleeding Allergy/immunology: No itchy/watery eyes, significant sneezing, urticaria, angioedema  Physical exam:  Pertinent or positive findings: He appears his age and suboptimally nourished.  He was initially asleep exhibiting intermittent snoring as well as hypopnea.  He did rales during the exam but kept drifting off to sleep.  Will await his voice was weak.  Palate elevation is present.  Hair is thin, especially over the crown.  Ptosis is greater on the left than the right.  The left iris is a darker hue than the right.  Teeth are stained with heavy plaque formation.  Heart sounds are not auscultated.  Chest is surprisingly clear.  Pedal pulses are decreased.  There is trace edema at the sock line.  Interosseous wasting of the hands is present. General appearance: Adequately nourished; no acute distress, increased work of breathing is present.   Lymphatic: No lymphadenopathy about the head, neck, axilla. Eyes: No conjunctival inflammation or lid edema is present. There is no scleral icterus. Ears:  External ear exam shows no significant lesions or deformities.   Nose:  External nasal examination shows no deformity or inflammation. Nasal mucosa are pink and moist without lesions, exudates Oral exam:  Lips and gums are healthy appearing. There is no oropharyngeal erythema or exudate. Neck:  No thyromegaly, masses, tenderness noted.    Heart:  Normal rate and regular rhythm. S1 and S2 normal without gallop, murmur,  click, rub .  Lungs: Chest clear to  auscultation without wheezes, rhonchi, rales, rubs. Abdomen: Bowel sounds are normal. Abdomen is soft and nontender with no organomegaly, hernias, masses. GU: Deferred  Extremities:  No cyanosis, clubbing, edema  Neurologic exam : Cn 2-7 intact Strength equal  in upper & lower extremities Balance, Rhomberg, finger to nose testing could not be completed due to clinical state Deep tendon reflexes are equal Skin: Warm & dry w/o tenting. No significant lesions or rash.  See summary under each active problem in the Problem List with associated updated therapeutic plan

## 2024-01-13 NOTE — Assessment & Plan Note (Signed)
 Reassess benefit of continuing Namenda.

## 2024-01-14 ENCOUNTER — Other Ambulatory Visit (HOSPITAL_COMMUNITY)
Admission: RE | Admit: 2024-01-14 | Discharge: 2024-01-14 | Disposition: A | Discharge status: Home / Self Care | Source: Skilled Nursing Facility | Attending: Adult Health | Admitting: Adult Health

## 2024-01-14 DIAGNOSIS — E039 Hypothyroidism, unspecified: Secondary | ICD-10-CM | POA: Diagnosis not present

## 2024-01-14 LAB — CBC WITH DIFFERENTIAL/PLATELET
Abs Immature Granulocytes: 0.02 10*3/uL (ref 0.00–0.07)
Basophils Absolute: 0.1 10*3/uL (ref 0.0–0.1)
Basophils Relative: 1 %
Eosinophils Absolute: 0.2 10*3/uL (ref 0.0–0.5)
Eosinophils Relative: 3 %
HCT: 43.7 % (ref 39.0–52.0)
Hemoglobin: 14 g/dL (ref 13.0–17.0)
Immature Granulocytes: 0 %
Lymphocytes Relative: 27 %
Lymphs Abs: 1.6 10*3/uL (ref 0.7–4.0)
MCH: 28.4 pg (ref 26.0–34.0)
MCHC: 32 g/dL (ref 30.0–36.0)
MCV: 88.6 fL (ref 80.0–100.0)
Monocytes Absolute: 1 10*3/uL (ref 0.1–1.0)
Monocytes Relative: 17 %
Neutro Abs: 3.1 10*3/uL (ref 1.7–7.7)
Neutrophils Relative %: 52 %
Platelets: 186 10*3/uL (ref 150–400)
RBC: 4.93 MIL/uL (ref 4.22–5.81)
RDW: 14.6 % (ref 11.5–15.5)
WBC: 5.9 10*3/uL (ref 4.0–10.5)
nRBC: 0 % (ref 0.0–0.2)

## 2024-01-14 LAB — TSH: TSH: 3.207 u[IU]/mL (ref 0.350–4.500)

## 2024-01-14 LAB — VITAMIN B12: Vitamin B-12: 595 pg/mL (ref 180–914)

## 2024-01-26 DIAGNOSIS — I739 Peripheral vascular disease, unspecified: Secondary | ICD-10-CM | POA: Diagnosis not present

## 2024-01-26 DIAGNOSIS — L602 Onychogryphosis: Secondary | ICD-10-CM | POA: Diagnosis not present

## 2024-01-26 DIAGNOSIS — L603 Nail dystrophy: Secondary | ICD-10-CM | POA: Diagnosis not present

## 2024-02-01 ENCOUNTER — Encounter: Payer: Self-pay | Admitting: Adult Health

## 2024-02-01 ENCOUNTER — Non-Acute Institutional Stay (SKILLED_NURSING_FACILITY): Payer: Self-pay | Admitting: Adult Health

## 2024-02-01 DIAGNOSIS — Z0000 Encounter for general adult medical examination without abnormal findings: Secondary | ICD-10-CM

## 2024-02-01 NOTE — Patient Instructions (Signed)
 Michael [name] , Thank you for taking time to come for your Medicare Wellness Visit. I appreciate your ongoing commitment to your health goals. Please review the following plan we discussed and let me know if I can assist you in the future.   These are the goals we discussed:  Goals      Absence of Fall and Fall-Related Injury     Evidence-based guidance:  Assess fall risk using a validated tool when available. Consider balance and gait impairment, muscle weakness, diminished vision or hearing, environmental hazards, presence of urinary or bowel urgency and/or incontinence.  Communicate fall injury risk to interprofessional healthcare team.  Develop a fall prevention plan with the patient and family.  Promote use of personal vision and auditory aids.  Promote reorientation, appropriate sensory stimulation, and routines to decrease risk of fall when changes in mental status are present.  Assess assistance level required for safe and effective self-care; consider referral for home care.  Encourage physical activity, such as performance of self-care at highest level of ability, strength and balance exercise program, and provision of appropriate assistive devices; refer to rehabilitation therapy.  Refer to community-based fall prevention program where available.  If fall occurs, determine the cause and revise fall injury prevention plan.  Regularly review medication contribution to fall risk; consider risk related to polypharmacy and age.  Refer to pharmacist for consultation when concerns about medications are revealed.  Balance adequate pain management with potential for oversedation.  Provide guidance related to environmental modifications.  Consider supplementation with Vitamin D.   Notes:      Follow up with Provider as scheduled     General - Client will not be readmitted within 30 days ([facility])     TOC Care Plan     Current Barriers:  Care Coordination needs related to Level of care  concerns, ADL IADL limitations, Cognitive Deficits, Inability to perform ADL's independently, and Inability to perform IADL's independently Chronic Disease Management support and education needs related to Dementia and _ COVID-19  Lacks caregiver support Cognitive Deficits  RNCM Clinical Goal(s):  Patient will take all medications exactly as prescribed and will call provider for medication related questions as evidenced by no missed medication doses  continue to work with RN Care Manager to address care management and care coordination needs related to  Dementia and _ COVID-19 as evidenced by adherence to CM Team Scheduled appointments work with Child psychotherapist to address  related to the management of Level of care concerns, ADL IADL limitations, Cognitive Deficits, Inability to perform ADL's independently, and Inability to perform IADL's independently related to the management of Dementia and COVID-19, increase independence  as evidenced by review of EMR and patient or Child psychotherapist report through collaboration with Medical illustrator, provider, and care team.   Interventions: Evaluation of current treatment plan related to  self management and patient's adherence to plan as established by provider  Transitions of Care:  New goal. Community Resource Referral Made to address None Doctor Visits  - discussed the importance of doctor visits Communication with PCP, Gasper Karst and SNF re: possible placement Level of Care needs assessed with patient/caregiver, options discussed with patient/caregiver, orders reviewed, and education provided [facility] provider for patient needs possible SNF Placement Contacted Health RN/OT/PT Gasper Karst [facility] added Nursing [phone])  COVID Interventions:  (Status:  New goal.) Short Term Goal Provided education to patient to enhance basic understanding of COVID-19 as a viral disease, measures to prevent exposure, signs and symptoms, recommended  vaccine schedule, when to  contact provider Discussed effects, symptoms, and management of "long COVID"' Assessed social determinant of health barriers  Patient Goals/Self-Care Activities: Participate in Transition of Care Program/Attend TOC scheduled calls Take all medications as prescribed Call pharmacy for medication refills 3-7 days in advance of running out of medications Perform all self care activities independently  Perform IADL's (shopping, preparing meals, housekeeping, managing finances) independently Work with the Child psychotherapist to address care coordination needs and will continue to work with the clinical team to address health care and disease management related needs  Follow Up Plan:  Telephone follow up appointment with care management team member scheduled for:  pending-dependent on outcome of  possible placement  The patient has been provided with contact information for the care management team and has been advised to call with any health related questions or concerns.          This is a list of the screening recommended for you and due dates:  Health Maintenance  Topic Date Due   Zoster (Shingles) Vaccine (1 of 2) 02/07/2024*   Medicare Annual Wellness Visit  04/11/2024*   COVID-19 Vaccine (7 - 2024-25 season) 10/25/2024*   Flu Shot  05/12/2024   DTaP/Tdap/Td vaccine (2 - Td or Tdap) 09/16/2033   HPV Vaccine  Aged Out   Meningitis B Vaccine  Aged Out   Pneumonia Vaccine  Discontinued  *Topic was postponed. The date shown is not the original due date.

## 2024-02-01 NOTE — Progress Notes (Signed)
 Subjective:   Michael [name] is a 83 y.o. male who presents for Medicare Annual/Subsequent preventive examination.  Visit Complete: In person  Patient Medicare AWV questionnaire was completed by the patient on 02-01-24; I have confirmed that all information answered by patient is correct and no changes since this date.  Cardiac Risk Factors include: advanced age (>50men, >29 women);male gender;sedentary lifestyle     Objective:    Today's Vitals   02/01/24 1159  BP: 122/68  Pulse: 88  Resp: 18  Temp: 98.3 F (36.8 C)  SpO2: 97%  Weight: 123 lb 6.4 oz (56 kg)  Height: 5\' 8"  (1.727 m)   Body mass index is 18.76 kg/m.     09/27/2023    9:16 PM 09/27/2023    5:14 PM 09/27/2023    3:41 PM 09/23/2023    9:39 AM 09/17/2023    2:15 PM 04/16/2016   11:12 AM 04/08/2016   10:34 AM  Advanced Directives  Does Patient Have a Medical Advance Directive?  Yes No Yes Yes Yes Yes  Type of Special educational needs teacher of [place];Living will  Healthcare Power of [place];Living will Healthcare Power of [place];Living will Healthcare Power of [place];Living will Healthcare Power of [place];Living will  Does patient want to make changes to medical advance directive?  Yes (ED - Information included in AVS)  Yes (ED - Information included in AVS) No - Patient declined No - Patient declined No - Patient declined  Copy of Healthcare Power of Attorney in Chart?  No - copy requested    No - copy requested No - copy requested  Would patient like information on creating a medical advance directive? No - Patient declined          Current Medications (verified) Outpatient Encounter Medications as of 02/01/2024  Medication Sig   acetaminophen  (TYLENOL ) 325 MG tablet Take 2 tablets (650 mg total) by mouth every 6 (six) hours as needed for mild pain (pain score 1-3) (or Fever >/= 100.4).   albuterol (VENTOLIN HFA) 108 (90 Base) MCG/ACT inhaler Inhale into the lungs every 6 (six) hours as needed  for wheezing or shortness of breath.   bisacodyl  (DULCOLAX) 5 MG EC tablet Take 1 tablet (5 mg total) by mouth daily as needed for moderate constipation.   calcium  carbonate (TUMS - DOSED IN MG ELEMENTAL CALCIUM ) 500 MG chewable tablet Chew 1 tablet by mouth 3 (three) times daily.   cyanocobalamin  1000 MCG tablet Take 1,000 mcg by mouth daily.   memantine  (NAMENDA ) 10 MG tablet Take 1 tablet (10 mg total) by mouth 2 (two) times daily.   omeprazole (PRILOSEC) 20 MG capsule Take 20 mg by mouth daily.   polyethylene glycol (MIRALAX  / GLYCOLAX ) 17 g packet Take 17 g by mouth daily as needed for mild constipation.   potassium chloride  SA (KLOR-CON  M) 20 MEQ tablet Take 20 mEq by mouth 2 (two) times daily.   Protein (PROSOURCE PO) Take 30 mLs by mouth 3 (three) times daily.   UNABLE TO FIND Take 1 Container by mouth 3 (three) times daily with meals. Med Name: magic cup  Also protein cookie in between meals   No facility-administered encounter medications on file as of 02/01/2024.    Allergies (verified) Hydrochlorothiazide    History: Past Medical History:  Diagnosis Date   Coronary artery disease    does not see a cardiologist, reports no cardiac symptoms   Hyperlipidemia    Hypertension    Seasonal allergies  Past Surgical History:  Procedure Laterality Date   BACK SURGERY     fusion,rods and cage   CATARACT EXTRACTION W/ INTRAOCULAR LENS IMPLANT Bilateral    per wife   COLONOSCOPY  07/31/2011   Procedure: COLONOSCOPY;  Surgeon: Beau Bound;  [facility];  Service: Gastroenterology;  Laterality: N/A;   RETINAL DETACHMENT SURGERY Left    wife believes it was in the left eye   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   Procedure: RIGHT REVERSE SHOULDER ARTHROPLASTY;  Surgeon: Sammye Cristal, MD;  Location: [facility] OR;  Service: Orthopedics;  Laterality: Right;  Right reverse total shoulder   Family History  Problem Relation Age of Onset   Heart disease Unknown     Asthma Unknown    Social History   Socioeconomic History   Marital status: Married    Spouse name: Ohm Dentler   Number of children: 2   Years of education: college   Highest education level: Some college, no degree  Occupational History   Occupation: retired    Associate Professor: RETIRED  Tobacco Use   Smoking status: Former    Current packs/day: 0.00    Average packs/day: 1.5 packs/day for 40.0 years (60.0 ttl pk-yrs)    Types: Cigarettes    Start date: 10/12/1950    Quit date: 10/12/1990    Years since quitting: 33.3    Passive exposure: Past   Smokeless tobacco: Never  Vaping Use   Vaping status: Never Used  Substance and Sexual Activity   Alcohol use: No   Drug use: No   Sexual activity: Not Currently    Partners: Female  Other Topics Concern   Not on file  Social History Narrative   Not on file   Social Drivers of Health   Financial Resource Strain: Low Risk  (09/27/2023)   Overall Financial Resource Strain (CARDIA)    Difficulty of Paying Living Expenses: Not hard at all  Food Insecurity: No Food Insecurity (09/28/2023)   Hunger Vital Sign    Worried About Running Out of Food in the Last Year: Never true    Ran Out of Food in the Last Year: Never true  Transportation Needs: No Transportation Needs (09/28/2023)   PRAPARE - Administrator, Civil Service (Medical): No    Lack of Transportation (Non-Medical): No  Physical Activity: Sufficiently Active (09/27/2023)   Exercise Vital Sign    Days of Exercise per Week: 7 days    Minutes of Exercise per Session: 30 min  Recent Concern: Physical Activity - Inactive (09/27/2023)   Exercise Vital Sign    Days of Exercise per Week: 0 days    Minutes of Exercise per Session: 0 min  Stress: No Stress Concern Present (09/27/2023)   [employer] of Occupational Health - Occupational Stress Questionnaire    Feeling of Stress : Not at all  Social Connections: Moderately Integrated (09/27/2023)   Social Connection  and Isolation Panel [NHANES]    Frequency of Communication with Friends and Family: More than three times a week    Frequency of Social Gatherings with Friends and Family: More than three times a week    Attends Religious Services: 1 to 4 times per year    Active Member of [employer] or Organizations: No    Attends Banker Meetings: Never    Marital Status: Married    Tobacco Counseling Counseling given: Not Answered   Clinical Intake:  Pre-visit preparation completed: Yes  Pain : No/denies pain  BMI - recorded: 18.76 Nutritional Status: BMI <19  Underweight Nutritional Risks: Unintentional weight loss, Failure to thrive Diabetes: No  How often do you need to have someone help you when you read instructions, pamphlets, or other written materials from your doctor or pharmacy?: 5 - Always  Interpreter Needed?: No  Comments: long term SNF   Activities of Daily Living    02/01/2024   12:02 PM 09/29/2023    8:45 AM  In your present state of health, do you have any difficulty performing the following activities:  Hearing? 0 0  Vision? 0 0  Difficulty concentrating or making decisions? 1 1  Walking or climbing stairs? 1   Dressing or bathing? 1   Doing errands, shopping? 1 1  Preparing Food and eating ? Y   Using the Toilet? Y   In the past six months, have you accidently leaked urine? Y   Do you have problems with loss of bowel control? Y   Managing your Medications? Y   Managing your Finances? Y   Housekeeping or managing your Housekeeping? Y     Patient Care Team: Marilyne Shu, NP as PCP - General (Geriatric Medicine)  Indicate any recent Medical Services you may have received from other than [facility] providers in the past year (date may be approximate).     Assessment:   This is a routine wellness examination for [facility].  Hearing/Vision screen No results found.   Goals Addressed             This Visit's Progress    Absence of Fall and  Fall-Related Injury       Evidence-based guidance:  Assess fall risk using a validated tool when available. Consider balance and gait impairment, muscle weakness, diminished vision or hearing, environmental hazards, presence of urinary or bowel urgency and/or incontinence.  Communicate fall injury risk to interprofessional healthcare team.  Develop a fall prevention plan with the patient and family.  Promote use of personal vision and auditory aids.  Promote reorientation, appropriate sensory stimulation, and routines to decrease risk of fall when changes in mental status are present.  Assess assistance level required for safe and effective self-care; consider referral for home care.  Encourage physical activity, such as performance of self-care at highest level of ability, strength and balance exercise program, and provision of appropriate assistive devices; refer to rehabilitation therapy.  Refer to community-based fall prevention program where available.  If fall occurs, determine the cause and revise fall injury prevention plan.  Regularly review medication contribution to fall risk; consider risk related to polypharmacy and age.  Refer to pharmacist for consultation when concerns about medications are revealed.  Balance adequate pain management with potential for oversedation.  Provide guidance related to environmental modifications.  Consider supplementation with Vitamin D.   Notes:      Follow up with Provider as scheduled       General - Client will not be readmitted within 30 days ([facility])         Depression Screen    02/01/2024   12:04 PM 11/09/2023    3:27 PM 09/27/2023    5:11 PM  PHQ 2/9 Scores  PHQ - 2 Score   0  Exception Documentation Other- indicate reason in comment box Other- indicate reason in comment box Other- indicate reason in comment box  Not completed unable to participate unable to participate Assessment Completed & Information Verified by Wife, Trejon Duford.     Fall Risk  02/01/2024   12:04 PM 11/09/2023    3:28 PM 10/22/2023    6:42 PM 09/27/2023    5:16 PM  Fall Risk   Falls in the past year? 1 1 1 1   Number falls in past yr: 0 1 0 1  Injury with Fall? 0 1 1 1   Risk for fall due to : History of fall(s);Impaired mobility;Medication side effect History of fall(s);Impaired balance/gait;Impaired mobility History of fall(s);Impaired vision;Impaired balance/gait;Medication side effect;Impaired mobility;Mental status change History of fall(s);Impaired balance/gait;Impaired mobility;Mental status change;Impaired vision  Follow up   Follow up appointment;Falls evaluation completed;Education provided;Falls prevention discussed Falls evaluation completed;Education provided;Falls prevention discussed    MEDICARE RISK AT HOME: Medicare Risk at Home Any stairs in or around the home?: Yes If so, are there any without handrails?: No Home free of loose throw rugs in walkways, pet beds, electrical cords, etc?: Yes Adequate lighting in your home to reduce risk of falls?: Yes Life alert?: No Use of a cane, walker or w/c?: Yes Grab bars in the bathroom?: Yes Shower chair or bench in shower?: Yes Elevated toilet seat or a handicapped toilet?: Yes  TIMED UP AND GO:  Was the test performed?  No    Cognitive Function:    02/01/2024   12:05 PM 02/24/2022   10:32 AM 06/24/2021   11:06 AM 06/24/2020   10:09 AM 12/11/2019   11:29 AM  MMSE - Mini Mental State Exam  Not completed: Unable to complete      Orientation to time  2 2 4  0  Orientation to Place  3 3 3 5   Registration  3 3 3 3   Attention/ Calculation  2 2 1 1   Recall  2 0 0 1  Language- name 2 objects  2 2 2 2   Language- repeat  1 1 1 1   Language- follow 3 step command  3 3 3 3   Language- read & follow direction  1 1 1 1   Write a sentence  1 1 1 1   Copy design  1 1 1 1   Total score  21 19 20 19         Immunizations Immunization History  Administered Date(s) Administered   Fluad  Quad(high Dose 65+) 08/02/2023   Moderna Covid-19 Vaccine Bivalent Booster 90yrs & up 11/10/2021   Moderna Sars-Covid-2 Vaccination 10/26/2019, 11/25/2019, 06/27/2020, 04/07/2021, 06/24/2023   Tdap 09/17/2023    TDAP status: Up to date  Flu Vaccine status: Up to date  Pneumococcal vaccine status: Up to date  Covid-19 vaccine status: Completed vaccines  Qualifies for Shingles Vaccine? No   Zostavax completed     Screening Tests Health Maintenance  Topic Date Due   Zoster Vaccines- Shingrix (1 of 2) 02/07/2024 (Originally 05/12/1960)   Medicare Annual Wellness (AWV)  04/11/2024 (Originally 1940/12/11)   COVID-19 Vaccine (7 - 2024-25 season) 10/25/2024 (Originally 08/19/2023)   INFLUENZA VACCINE  05/12/2024   DTaP/Tdap/Td (2 - Td or Tdap) 09/16/2033   HPV VACCINES  Aged Out   Meningococcal B Vaccine  Aged Out   Pneumonia Vaccine 50+ Years old  Discontinued    Health Maintenance  There are no preventive care reminders to display for this patient.  Colorectal cancer screening: No longer required.   Lung Cancer Screening: (Low Dose CT Chest recommended if Age 1-80 years, 20 pack-year currently smoking OR have quit w/in 15years.) does not qualify.   Lung Cancer Screening Referral:   Additional Screening:  Hepatitis C Screening: does not qualify; Completed   Vision  Screening: Recommended annual ophthalmology exams for early detection of glaucoma and other disorders of the eye. Is the patient up to date with their annual eye exam?  No  Who is the provider or what is the name of the office in which the patient attends annual eye exams?  If pt is not established with a provider, would they like to be referred to a provider to establish care? No .   Dental Screening: Recommended annual dental exams for proper oral hygiene  Diabetic Foot Exam:   Community Resource Referral / Chronic Care Management: CRR required this visit?  No   CCM required this visit?  No     Plan:      I have personally reviewed and noted the following in the patient's chart:   Medical and social history Use of alcohol, tobacco or illicit drugs  Current medications and supplements including opioid prescriptions. Patient is not currently taking opioid prescriptions. Functional ability and status Nutritional status Physical activity Advanced directives List of other physicians Hospitalizations, surgeries, and ER visits in previous 12 months Vitals Screenings to include cognitive, depression, and falls Referrals and appointments  In addition, I have reviewed and discussed with patient certain preventive protocols, quality metrics, and best practice recommendations. A written personalized care plan for preventive services as well as general preventive health recommendations were provided to patient.     Marilyne Shu, NP   02/01/2024   After Visit Summary: (In Person-Printed) AVS printed and given to the patient  Nurse Notes: this exam was performed by myself at this facility

## 2024-02-17 ENCOUNTER — Non-Acute Institutional Stay (SKILLED_NURSING_FACILITY): Payer: Self-pay | Admitting: Adult Health

## 2024-02-17 DIAGNOSIS — E876 Hypokalemia: Secondary | ICD-10-CM | POA: Diagnosis not present

## 2024-02-17 DIAGNOSIS — R627 Adult failure to thrive: Secondary | ICD-10-CM | POA: Diagnosis not present

## 2024-02-17 DIAGNOSIS — I700 Atherosclerosis of aorta: Secondary | ICD-10-CM

## 2024-02-17 NOTE — Progress Notes (Signed)
 Location:  [facility] Room Number: 129 Place of Service:  SNF (31)   CODE STATUS: dnr  Allergies  Allergen Reactions   Hydrochlorothiazide  Other (See Comments)    hypokalemia    Chief Complaint  Patient presents with   Medical Management of Chronic Issues       Failure to thrive in adult:  Hypokalemia: Aortic atherosclerosis     HPI:  He is a 83 y.o. long term resident of this facility being seen for the management of his chronic illnesses:Failure to thrive in adult:  Hypokalemia: Aortic atherosclerosis. He has completed remeron therapy and his weight is stable at 122 pounds. There are no reports of uncontrolled pain. He does get out of bed daily.    Past Medical History:  Diagnosis Date   Coronary artery disease    does not see a cardiologist, reports no cardiac symptoms   Hyperlipidemia    Hypertension    Seasonal allergies     Past Surgical History:  Procedure Laterality Date   BACK SURGERY     fusion,rods and cage   CATARACT EXTRACTION W/ INTRAOCULAR LENS IMPLANT Bilateral    per wife   COLONOSCOPY  07/31/2011   Procedure: COLONOSCOPY;  Surgeon: Beau Bound;  [facility];  Service: Gastroenterology;  Laterality: N/A;   RETINAL DETACHMENT SURGERY Left    wife believes it was in the left eye   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   Procedure: RIGHT REVERSE SHOULDER ARTHROPLASTY;  Surgeon: Sammye Cristal, MD;  Location: [facility] OR;  Service: Orthopedics;  Laterality: Right;  Right reverse total shoulder    Social History   Socioeconomic History   Marital status: Married    Spouse name: Bryent Collman   Number of children: 2   Years of education: college   Highest education level: Some college, no degree  Occupational History   Occupation: retired    Associate Professor: RETIRED  Tobacco Use   Smoking status: Former    Current packs/day: 0.00    Average packs/day: 1.5 packs/day for 40.0 years (60.0 ttl pk-yrs)    Types:  Cigarettes    Start date: 10/12/1950    Quit date: 10/12/1990    Years since quitting: 33.3    Passive exposure: Past   Smokeless tobacco: Never  Vaping Use   Vaping status: Never Used  Substance and Sexual Activity   Alcohol use: No   Drug use: No   Sexual activity: Not Currently    Partners: Female  Other Topics Concern   Not on file  Social History Narrative   Not on file   Social Drivers of Health   Financial Resource Strain: Low Risk  (09/27/2023)   Overall Financial Resource Strain (CARDIA)    Difficulty of Paying Living Expenses: Not hard at all  Food Insecurity: No Food Insecurity (09/28/2023)   Hunger Vital Sign    Worried About Running Out of Food in the Last Year: Never true    Ran Out of Food in the Last Year: Never true  Transportation Needs: No Transportation Needs (09/28/2023)   PRAPARE - Administrator, Civil Service (Medical): No    Lack of Transportation (Non-Medical): No  Physical Activity: Sufficiently Active (09/27/2023)   Exercise Vital Sign    Days of Exercise per Week: 7 days    Minutes of Exercise per Session: 30 min  Recent Concern: Physical Activity - Inactive (09/27/2023)   Exercise Vital Sign    Days of Exercise  per Week: 0 days    Minutes of Exercise per Session: 0 min  Stress: No Stress Concern Present (09/27/2023)   [employer] of Occupational Health - Occupational Stress Questionnaire    Feeling of Stress : Not at all  Social Connections: Moderately Integrated (09/27/2023)   Social Connection and Isolation Panel [NHANES]    Frequency of Communication with Friends and Family: More than three times a week    Frequency of Social Gatherings with Friends and Family: More than three times a week    Attends Religious Services: 1 to 4 times per year    Active Member of [employer] or Organizations: No    Attends Banker Meetings: Never    Marital Status: Married  Catering manager Violence: Not At Risk (09/28/2023)    Humiliation, Afraid, Rape, and Kick questionnaire    Fear of Current or Ex-Partner: No    Emotionally Abused: No    Physically Abused: No    Sexually Abused: No   Family History  Problem Relation Age of Onset   Heart disease Unknown    Asthma Unknown       VITAL SIGNS BP 138/78   Pulse 63   Temp 98.2 F (36.8 C)   Resp 18   Ht 5\' 8"  (1.727 m)   Wt 122 lb 6.4 oz (55.5 kg)   SpO2 96%   BMI 18.61 kg/m   Outpatient Encounter Medications as of 02/17/2024  Medication Sig   acetaminophen  (TYLENOL ) 325 MG tablet Take 2 tablets (650 mg total) by mouth every 6 (six) hours as needed for mild pain (pain score 1-3) (or Fever >/= 100.4).   albuterol (VENTOLIN HFA) 108 (90 Base) MCG/ACT inhaler Inhale into the lungs every 6 (six) hours as needed for wheezing or shortness of breath.   bisacodyl  (DULCOLAX) 5 MG EC tablet Take 1 tablet (5 mg total) by mouth daily as needed for moderate constipation.   calcium  carbonate (TUMS - DOSED IN MG ELEMENTAL CALCIUM ) 500 MG chewable tablet Chew 1 tablet by mouth 3 (three) times daily.   cyanocobalamin  1000 MCG tablet Take 1,000 mcg by mouth daily.   memantine  (NAMENDA ) 10 MG tablet Take 1 tablet (10 mg total) by mouth 2 (two) times daily.   omeprazole (PRILOSEC) 20 MG capsule Take 20 mg by mouth daily.   polyethylene glycol (MIRALAX  / GLYCOLAX ) 17 g packet Take 17 g by mouth daily as needed for mild constipation.   potassium chloride  SA (KLOR-CON  M) 20 MEQ tablet Take 20 mEq by mouth 2 (two) times daily.   Protein (PROSOURCE PO) Take 30 mLs by mouth 3 (three) times daily.   UNABLE TO FIND Take 1 Container by mouth 3 (three) times daily with meals. Med Name: magic cup  Also protein cookie in between meals   No facility-administered encounter medications on file as of 02/17/2024.     SIGNIFICANT DIAGNOSTIC EXAMS  PREVIOUS   09-27-23: wbc 9.5; hgb 12.8; hct 39.9; mcv 88.9 plt 193; glucose 105; bun 39; creat 1.39; k+ 3.5; na++ 145; ca 9.4 gfr 50;  protein 6.2 albumin 3.0; CK 2986 09-29-23: wbc 4.1; hgb 10.4; hct 32.1; mcv 90.4 plt 167 glucose 84; bun 43; creat 1.23; k+ 3.0; na++ 141; ca 7.7; gfr 58; CK 988 10-04-23: wbc 6.4; hgb 11.8; hct 35.7; mcv 87.9 plt 286; glucose 91; bun 23; creat 0.97; k+ 3.3; na++ 142; ca 8.1; gfr >60  10-07-23: glucose 73; bun 23; creat 0.92; k+ 3.5; na=+ 136; ca 8.1; gfr >  60 CK 27  NO NEW LABS.    Review of Systems  Unable to perform ROS: Dementia   Physical Exam Constitutional:      General: He is not in acute distress.    Appearance: He is well-developed. He is not diaphoretic.  Neck:     Thyroid : No thyromegaly.  Cardiovascular:     Rate and Rhythm: Normal rate and regular rhythm.     Pulses: Normal pulses.     Heart sounds: Normal heart sounds.  Pulmonary:     Effort: Pulmonary effort is normal. No respiratory distress.     Breath sounds: Normal breath sounds.  Abdominal:     General: Bowel sounds are normal. There is no distension.     Palpations: Abdomen is soft.     Tenderness: There is no abdominal tenderness.  Musculoskeletal:        General: Normal range of motion.     Cervical back: Neck supple.     Right lower leg: No edema.     Left lower leg: No edema.  Lymphadenopathy:     Cervical: No cervical adenopathy.  Skin:    General: Skin is warm and dry.  Neurological:     Mental Status: He is alert. Mental status is at baseline.     Comments: BCAT 17/50   Psychiatric:        Mood and Affect: Mood normal.      ASSESSMENT/ PLAN:  TODAY  Failure to thrive in adult: weight is 122 pounds; without significant weight change in the past month.   2. Hypokalemia: k+ 3.5; will continue k+ 20 meq daily   3. Aortic atherosclerosis (ct 09-23-23): is not on statin due to advanced age.   PREVIOUS   4. Primary hypertension: b/p 124/76 will monitor   5. Chronic non-seasonal allergic rhinitis: will continue xyzal 5 mg daily   6. GERD without esophagitis: will continue  prilosec 20  mg daily   7. Chronic constipation: will continue miralax  daily  as needed and dulcolax 5 mg po daily as needed   8. Hypocalcemia: will continue tums three times daily   9. 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 Weight loss is an unfortunate but expected outcome . Will continue namenda  10 mg twice daily .   10. Lumbosacral spondylosis without myelopathy: is presently without uncontrolled pain; will monitor   11. Chronic kidney disease stage 3b: bun 23; creat 0.97 gfr >60     Britt Candle NP [facility] Adult [facility]   call [phone]

## 2024-03-23 ENCOUNTER — Encounter: Payer: Self-pay | Admitting: Adult Health

## 2024-03-23 ENCOUNTER — Non-Acute Institutional Stay (SKILLED_NURSING_FACILITY): Payer: Self-pay | Admitting: Adult Health

## 2024-03-23 DIAGNOSIS — F0390 Unspecified dementia without behavioral disturbance: Secondary | ICD-10-CM

## 2024-03-23 DIAGNOSIS — F02B0 Dementia in other diseases classified elsewhere, moderate, without behavioral disturbance, psychotic disturbance, mood disturbance, and anxiety: Secondary | ICD-10-CM

## 2024-03-23 DIAGNOSIS — K5909 Other constipation: Secondary | ICD-10-CM

## 2024-03-23 NOTE — Progress Notes (Signed)
 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 He continues to get out of bed daily; he comes out of his room. He is without emotional distress. There are no reports of uncontrolled pain.    Past Medical History:  Diagnosis Date   Coronary artery disease    does not see a cardiologist, reports no cardiac symptoms   Hyperlipidemia    Hypertension    Seasonal allergies     Past Surgical History:  Procedure Laterality Date   BACK SURGERY     fusion,rods and cage   CATARACT EXTRACTION W/ INTRAOCULAR LENS IMPLANT Bilateral    per wife   COLONOSCOPY  07/31/2011   Procedure: COLONOSCOPY;  Surgeon: Beau Bound;  [facility]: [facility];  Service: Gastroenterology;  Laterality: N/A;   RETINAL DETACHMENT SURGERY Left    wife believes it was in the left eye   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   Procedure: RIGHT REVERSE SHOULDER ARTHROPLASTY;  Surgeon: Sammye Cristal, MD;  Location: [facility] OR;  Service: Orthopedics;  Laterality: Right;  Right reverse total shoulder    Social History   Socioeconomic History   Marital status: Married    Spouse name: Yesenia Fontenette   Number of children: 2   Years of education: college   Highest education  level: Some college, no degree  Occupational History   Occupation: retired    Associate Professor: RETIRED  Tobacco Use   Smoking status: Former    Current packs/day: 0.00    Average packs/day: 1.5 packs/day for 40.0 years (60.0 ttl pk-yrs)    Types: Cigarettes    Start date: 10/12/1950    Quit date: 10/12/1990    Years since quitting: 33.4    Passive exposure: Past   Smokeless tobacco: Never  Vaping Use   Vaping status: Never Used  Substance and Sexual Activity   Alcohol use: No   Drug use: No   Sexual activity: Not Currently    Partners: Female  Other Topics Concern   Not on file  Social History Narrative   Not on file   Social Drivers of Health   Financial Resource Strain: Low Risk  (09/27/2023)   Overall Financial Resource Strain (CARDIA)    Difficulty of Paying Living Expenses: Not hard at all  Food Insecurity: No Food Insecurity (09/28/2023)   Hunger Vital Sign    Worried About Running Out of Food in the Last Year: Never true    Ran Out of Food in the Last Year: Never true  Transportation Needs: No Transportation Needs (09/28/2023)   PRAPARE - Administrator, Civil Service (Medical): No    Lack of Transportation (Non-Medical): No  Physical Activity: Sufficiently Active (09/27/2023)   Exercise Vital Sign    Days of Exercise per Week: 7 days  Minutes of Exercise per Session: 30 min  Recent Concern: Physical Activity - Inactive (09/27/2023)   Exercise Vital Sign    Days of Exercise per Week: 0 days    Minutes of Exercise per Session: 0 min  Stress: No Stress Concern Present (09/27/2023)   [employer] of Occupational Health - Occupational Stress Questionnaire    Feeling of Stress : Not at all  Social Connections: Moderately Integrated (09/27/2023)   Social Connection and Isolation Panel    Frequency of Communication with Friends and Family: More than three times a week    Frequency of Social Gatherings with Friends and Family: More than three times a week     Attends Religious Services: 1 to 4 times per year    Active Member of [employer] or Organizations: No    Attends Banker Meetings: Never    Marital Status: Married  Catering manager Violence: Not At Risk (09/28/2023)   Humiliation, Afraid, Rape, and Kick questionnaire    Fear of Current or Ex-Partner: No    Emotionally Abused: No    Physically Abused: No    Sexually Abused: No   Family History  Problem Relation Age of Onset   Heart disease Unknown    Asthma Unknown       VITAL SIGNS BP (!) 148/71   Pulse 64   Temp 97.7 F (36.5 C)   Resp (!) 22   Ht 5' 8 (1.727 m)   Wt 125 lb 6.4 oz (56.9 kg)   SpO2 97%   BMI 19.07 kg/m   Outpatient Encounter Medications as of 03/23/2024  Medication Sig   acetaminophen  (TYLENOL ) 325 MG tablet Take 2 tablets (650 mg total) by mouth every 6 (six) hours as needed for mild pain (pain score 1-3) (or Fever >/= 100.4).   albuterol (VENTOLIN HFA) 108 (90 Base) MCG/ACT inhaler Inhale into the lungs every 6 (six) hours as needed for wheezing or shortness of breath.   bisacodyl  (DULCOLAX) 5 MG EC tablet Take 1 tablet (5 mg total) by mouth daily as needed for moderate constipation.   calcium  carbonate (TUMS - DOSED IN MG ELEMENTAL CALCIUM ) 500 MG chewable tablet Chew 1 tablet by mouth 3 (three) times daily.   cyanocobalamin  1000 MCG tablet Take 1,000 mcg by mouth daily.   memantine  (NAMENDA ) 10 MG tablet Take 1 tablet (10 mg total) by mouth 2 (two) times daily.   omeprazole (PRILOSEC) 20 MG capsule Take 20 mg by mouth daily.   polyethylene glycol (MIRALAX  / GLYCOLAX ) 17 g packet Take 17 g by mouth daily as needed for mild constipation.   potassium chloride  SA (KLOR-CON  M) 20 MEQ tablet Take 20 mEq by mouth 2 (two) times daily.   Protein (PROSOURCE PO) Take 30 mLs by mouth 3 (three) times daily.   UNABLE TO FIND Take 1 Container by mouth 3 (three) times daily with meals. Med Name: magic cup  Also protein cookie in between meals   No  facility-administered encounter medications on file as of 03/23/2024.     SIGNIFICANT DIAGNOSTIC EXAMS  PREVIOUS   09-27-23: wbc 9.5; hgb 12.8; hct 39.9; mcv 88.9 plt 193; glucose 105; bun 39; creat 1.39; k+ 3.5; na++ 145; ca 9.4 gfr 50; protein 6.2 albumin 3.0; CK 2986 09-29-23: wbc 4.1; hgb 10.4; hct 32.1; mcv 90.4 plt 167 glucose 84; bun 43; creat 1.23; k+ 3.0; na++ 141; ca 7.7; gfr 58; CK 988 10-04-23: wbc 6.4; hgb 11.8; hct 35.7; mcv 87.9 plt 286; glucose 91;  bun 23; creat 0.97; k+ 3.3; na++ 142; ca 8.1; gfr >60  10-07-23: glucose 73; bun 23; creat 0.92; k+ 3.5; na=+ 136; ca 8.1; gfr >60 CK 27  NO NEW LABS.    Review of Systems  Unable to perform ROS: Dementia   Physical Exam Constitutional:      General: He is not in acute distress.    Appearance: He is well-developed. He is not diaphoretic.  Neck:     Thyroid : No thyromegaly.   Cardiovascular:     Rate and Rhythm: Normal rate and regular rhythm.     Pulses: Normal pulses.     Heart sounds: Normal heart sounds.  Pulmonary:     Effort: Pulmonary effort is normal. No respiratory distress.     Breath sounds: Normal breath sounds.  Abdominal:     General: Bowel sounds are normal. There is no distension.     Palpations: Abdomen is soft.     Tenderness: There is no abdominal tenderness.   Musculoskeletal:        General: Normal range of motion.     Cervical back: Neck supple.     Right lower leg: No edema.     Left lower leg: No edema.  Lymphadenopathy:     Cervical: No cervical adenopathy.   Skin:    General: Skin is warm and dry.   Neurological:     Mental Status: He is alert. Mental status is at baseline.     Comments: BCAT 17/50  Psychiatric:        Mood and Affect: Mood normal.     ASSESSMENT/ PLAN:  TODAY  Chronic constipation: will continue miralax  daily as needed  2. Hypocalcemia will continue tums three times daily   3. 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 His weight is stable at 125 pounds; will continue namenda  10 mg twice daily   PREVIOUS   4. Lumbosacral spondylosis without myelopathy: is presently without uncontrolled pain; will monitor   5. Chronic kidney disease stage 3b: bun 23; creat 0.97 gfr >60   6. Failure to thrive in adult: weight is 125 pounds; without significant weight change in the past month.   7. Hypokalemia: k+ 3.5; will continue k+ 20 meq twice daily   8. Aortic atherosclerosis (ct 09-23-23): is not on statin due to advanced age.   9. Primary hypertension: b/p 148/71 will monitor   10. Chronic non-seasonal allergic rhinitis: will continue xyzal 5 mg daily  11. GERD without esophagitis: will continue prilosec 20 mg daily    Britt Candle NP [facility]  call [phone]

## 2024-03-27 DIAGNOSIS — I739 Peripheral vascular disease, unspecified: Secondary | ICD-10-CM | POA: Diagnosis not present

## 2024-03-27 DIAGNOSIS — L602 Onychogryphosis: Secondary | ICD-10-CM | POA: Diagnosis not present

## 2024-03-28 DIAGNOSIS — G301 Alzheimer's disease with late onset: Secondary | ICD-10-CM | POA: Diagnosis not present

## 2024-03-28 DIAGNOSIS — R498 Other voice and resonance disorders: Secondary | ICD-10-CM | POA: Diagnosis not present

## 2024-03-28 DIAGNOSIS — R0902 Hypoxemia: Secondary | ICD-10-CM | POA: Diagnosis not present

## 2024-03-28 DIAGNOSIS — R488 Other symbolic dysfunctions: Secondary | ICD-10-CM | POA: Diagnosis not present

## 2024-03-29 DIAGNOSIS — R498 Other voice and resonance disorders: Secondary | ICD-10-CM | POA: Diagnosis not present

## 2024-03-29 DIAGNOSIS — G301 Alzheimer's disease with late onset: Secondary | ICD-10-CM | POA: Diagnosis not present

## 2024-03-29 DIAGNOSIS — R488 Other symbolic dysfunctions: Secondary | ICD-10-CM | POA: Diagnosis not present

## 2024-03-29 DIAGNOSIS — R0902 Hypoxemia: Secondary | ICD-10-CM | POA: Diagnosis not present

## 2024-03-30 DIAGNOSIS — R488 Other symbolic dysfunctions: Secondary | ICD-10-CM | POA: Diagnosis not present

## 2024-03-30 DIAGNOSIS — R0902 Hypoxemia: Secondary | ICD-10-CM | POA: Diagnosis not present

## 2024-03-30 DIAGNOSIS — R498 Other voice and resonance disorders: Secondary | ICD-10-CM | POA: Diagnosis not present

## 2024-03-30 DIAGNOSIS — G301 Alzheimer's disease with late onset: Secondary | ICD-10-CM | POA: Diagnosis not present

## 2024-03-31 DIAGNOSIS — G301 Alzheimer's disease with late onset: Secondary | ICD-10-CM | POA: Diagnosis not present

## 2024-03-31 DIAGNOSIS — R0902 Hypoxemia: Secondary | ICD-10-CM | POA: Diagnosis not present

## 2024-03-31 DIAGNOSIS — R498 Other voice and resonance disorders: Secondary | ICD-10-CM | POA: Diagnosis not present

## 2024-03-31 DIAGNOSIS — R488 Other symbolic dysfunctions: Secondary | ICD-10-CM | POA: Diagnosis not present

## 2024-04-03 DIAGNOSIS — R0902 Hypoxemia: Secondary | ICD-10-CM | POA: Diagnosis not present

## 2024-04-03 DIAGNOSIS — R498 Other voice and resonance disorders: Secondary | ICD-10-CM | POA: Diagnosis not present

## 2024-04-03 DIAGNOSIS — G301 Alzheimer's disease with late onset: Secondary | ICD-10-CM | POA: Diagnosis not present

## 2024-04-03 DIAGNOSIS — R488 Other symbolic dysfunctions: Secondary | ICD-10-CM | POA: Diagnosis not present

## 2024-04-04 DIAGNOSIS — R0902 Hypoxemia: Secondary | ICD-10-CM | POA: Diagnosis not present

## 2024-04-04 DIAGNOSIS — R488 Other symbolic dysfunctions: Secondary | ICD-10-CM | POA: Diagnosis not present

## 2024-04-04 DIAGNOSIS — G301 Alzheimer's disease with late onset: Secondary | ICD-10-CM | POA: Diagnosis not present

## 2024-04-04 DIAGNOSIS — R498 Other voice and resonance disorders: Secondary | ICD-10-CM | POA: Diagnosis not present

## 2024-04-05 DIAGNOSIS — R0902 Hypoxemia: Secondary | ICD-10-CM | POA: Diagnosis not present

## 2024-04-05 DIAGNOSIS — G301 Alzheimer's disease with late onset: Secondary | ICD-10-CM | POA: Diagnosis not present

## 2024-04-05 DIAGNOSIS — R498 Other voice and resonance disorders: Secondary | ICD-10-CM | POA: Diagnosis not present

## 2024-04-05 DIAGNOSIS — R488 Other symbolic dysfunctions: Secondary | ICD-10-CM | POA: Diagnosis not present

## 2024-04-13 ENCOUNTER — Non-Acute Institutional Stay (SKILLED_NURSING_FACILITY): Payer: Self-pay | Admitting: Adult Health

## 2024-04-13 ENCOUNTER — Encounter: Payer: Self-pay | Admitting: Adult Health

## 2024-04-13 DIAGNOSIS — N1832 Chronic kidney disease, stage 3b: Secondary | ICD-10-CM

## 2024-04-13 DIAGNOSIS — I700 Atherosclerosis of aorta: Secondary | ICD-10-CM

## 2024-04-13 DIAGNOSIS — F0390 Unspecified dementia without behavioral disturbance: Secondary | ICD-10-CM | POA: Diagnosis not present

## 2024-04-13 NOTE — Progress Notes (Signed)
 Location:  [facility] Room Number: 129P Place of Service:  SNF (31)   CODE STATUS: DNR  Allergies  Allergen Reactions   Hydrochlorothiazide  Other (See Comments)    hypokalemia    Chief Complaint  Patient presents with   Acute Visit    Care plan meeting     HPI:  We have come together for his care plan meeting. Family present.  BIMS 7/15 mood 6/30: decreased energy, nervous at times. He is out of bed to wheelchair; does ambulate with sons.  has had one fall without injury. He requires max to dependent assist with his adl care. He is frequently incontinent of bladder and bowel; does have to void frequently; voids small amounts. Dietary: weight 124 pounds; set up for meals. Regular diet appetite 26-75%; has supplements. Therapy: none at this time. Activities: attends at times; usually 1:1. He will continue to be followed for his chronic illnesses including: Aortic atherosclerosis   Major neurocognitive disorder  Chronic kidney disease stage 3   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   Procedure: RIGHT REVERSE SHOULDER ARTHROPLASTY;  Surgeon: Eva Herring, MD;  Location: [facility] OR;  Service: Orthopedics;  Laterality: Right;  Right reverse total shoulder    Social History   Socioeconomic History   Marital status: Married    Spouse name: Steffen Hase   Number of children: 2    Years of education: college   Highest education level: Some college, no degree  Occupational History   Occupation: retired    Associate Professor: RETIRED  Tobacco Use   Smoking status: Former    Current packs/day: 0.00    Average packs/day: 1.5 packs/day for 40.0 years (60.0 ttl pk-yrs)    Types: Cigarettes    Start date: 10/12/1950    Quit date: 10/12/1990    Years since quitting: 33.5    Passive exposure: Past   Smokeless tobacco: Never  Vaping Use   Vaping status: Never Used  Substance and Sexual Activity   Alcohol use: No   Drug use: No   Sexual activity: Not Currently    Partners: Female  Other Topics Concern   Not on file  Social History Narrative   Not on file   Social Drivers of Health   Financial Resource Strain: Low Risk  (09/27/2023)   Overall Financial Resource Strain (CARDIA)    Difficulty of Paying Living Expenses: Not hard at all  Food Insecurity: No Food Insecurity (09/28/2023)   Hunger Vital Sign    Worried About Running Out of Food in the Last Year: Never true    Ran Out of Food in the Last Year: Never true  Transportation Needs: No Transportation Needs (09/28/2023)   PRAPARE - Administrator, Civil Service (Medical): No    Lack of Transportation (Non-Medical): No  Physical Activity: Sufficiently  Active (09/27/2023)   Exercise Vital Sign    Days of Exercise per Week: 7 days    Minutes of Exercise per Session: 30 min  Recent Concern: Physical Activity - Inactive (09/27/2023)   Exercise Vital Sign    Days of Exercise per Week: 0 days    Minutes of Exercise per Session: 0 min  Stress: No Stress Concern Present (09/27/2023)   [employer] of Occupational Health - Occupational Stress Questionnaire    Feeling of Stress : Not at all  Social Connections: Moderately Integrated (09/27/2023)   Social Connection and Isolation Panel    Frequency of Communication with Friends and Family: More than three times a week    Frequency of Social Gatherings with  Friends and Family: More than three times a week    Attends Religious Services: 1 to 4 times per year    Active Member of [employer] or Organizations: No    Attends Banker Meetings: Never    Marital Status: Married  Catering manager Violence: Not At Risk (09/28/2023)   Humiliation, Afraid, Rape, and Kick questionnaire    Fear of Current or Ex-Partner: No    Emotionally Abused: No    Physically Abused: No    Sexually Abused: No   Family History  Problem Relation Age of Onset   Heart disease Unknown    Asthma Unknown       VITAL SIGNS BP 128/73   Pulse 65   Temp (!) 97.3 F (36.3 C)   Resp 18   Ht 5' 8 (1.727 m)   Wt 125 lb 6.4 oz (56.9 kg)   SpO2 97%   BMI 19.07 kg/m   Outpatient Encounter Medications as of 04/13/2024  Medication Sig   acetaminophen  (TYLENOL ) 325 MG tablet Take 2 tablets (650 mg total) by mouth every 6 (six) hours as needed for mild pain (pain score 1-3) (or Fever >/= 100.4).   albuterol (VENTOLIN HFA) 108 (90 Base) MCG/ACT inhaler Inhale into the lungs every 6 (six) hours as needed for wheezing or shortness of breath.   ammonium lactate (LAC-HYDRIN) 12 % lotion Apply 1 Application topically daily.   bisacodyl  (DULCOLAX) 5 MG EC tablet Take 1 tablet (5 mg total) by mouth daily as needed for moderate constipation.   calcium  carbonate (TUMS - DOSED IN MG ELEMENTAL CALCIUM ) 500 MG chewable tablet Chew 1 tablet by mouth 3 (three) times daily.   cyanocobalamin  1000 MCG tablet Take 1,000 mcg by mouth daily.   Eyelid Cleansers (OCUSOFT LID SCRUB EX) Apply topically as needed.   feeding supplement (ENSURE ENLIVE / ENSURE PLUS) LIQD Take 0.08 mLs by mouth daily.   loratadine  (CLARITIN ) 10 MG tablet Take 10 mg by mouth at bedtime.   memantine  (NAMENDA ) 10 MG tablet Take 1 tablet (10 mg total) by mouth 2 (two) times daily.   omeprazole (PRILOSEC) 20 MG capsule Take 20 mg by mouth daily.   polyethylene glycol (MIRALAX  / GLYCOLAX ) 17 g packet Take 17 g by mouth  daily as needed for mild constipation.   potassium chloride  SA (KLOR-CON  M) 20 MEQ tablet Take 20 mEq by mouth 2 (two) times daily.   Protein (PROSOURCE PO) Take 30 mLs by mouth 3 (three) times daily.   UNABLE TO FIND Take 1 Container by mouth 3 (three) times daily with meals. Med Name: magic cup  Also protein cookie in between meals   No facility-administered encounter medications on file as of 04/13/2024.     SIGNIFICANT DIAGNOSTIC EXAMS  PREVIOUS   09-27-23: wbc 9.5; hgb 12.8; hct 39.9; mcv 88.9 plt 193; glucose 105; bun 39; creat 1.39; k+ 3.5; na++ 145; ca 9.4 gfr 50; protein 6.2 albumin 3.0; CK 2986 09-29-23: wbc 4.1; hgb 10.4; hct 32.1; mcv 90.4 plt 167 glucose 84; bun 43; creat 1.23; k+ 3.0; na++ 141; ca 7.7; gfr 58; CK 988 10-04-23: wbc 6.4; hgb 11.8; hct 35.7; mcv 87.9 plt 286; glucose 91; bun 23; creat 0.97; k+ 3.3; na++ 142; ca 8.1; gfr >60  10-07-23: glucose 73; bun 23; creat 0.92; k+ 3.5; na=+ 136; ca 8.1; gfr >60 CK 27  NO NEW LABS.   Review of Systems  Unable to perform ROS: Dementia   Physical Exam Constitutional:      General: He is not in acute distress.    Appearance: He is well-developed. He is not diaphoretic.  Neck:     Thyroid : No thyromegaly.  Cardiovascular:     Rate and Rhythm: Normal rate and regular rhythm.     Pulses: Normal pulses.     Heart sounds: Normal heart sounds.  Pulmonary:     Effort: Pulmonary effort is normal. No respiratory distress.     Breath sounds: Normal breath sounds.  Abdominal:     General: Bowel sounds are normal. There is no distension.     Palpations: Abdomen is soft.     Tenderness: There is no abdominal tenderness.  Musculoskeletal:        General: Normal range of motion.     Cervical back: Neck supple.     Right lower leg: No edema.     Left lower leg: No edema.  Lymphadenopathy:     Cervical: No cervical adenopathy.  Skin:    General: Skin is warm and dry.  Neurological:     Mental Status: He is alert. Mental  status is at baseline.     Comments: BCAT 17/50   Psychiatric:        Mood and Affect: Mood normal.     ASSESSMENT/ PLAN:  TODAY  Aortic atherosclerosis Major neurocognitive disorder Chronic kidney disease stage 3   Will continue current medications Will continue current plan of care  Will continue to monitor his status  Time spent with patient: 40 minutes: medications; dietary; plan of care.    Barnie Seip NP [facility]  call [phone]

## 2024-05-01 ENCOUNTER — Encounter: Payer: Self-pay | Admitting: Internal Medicine

## 2024-05-01 ENCOUNTER — Non-Acute Institutional Stay (SKILLED_NURSING_FACILITY): Payer: Self-pay | Admitting: Internal Medicine

## 2024-05-01 DIAGNOSIS — F0390 Unspecified dementia without behavioral disturbance: Secondary | ICD-10-CM

## 2024-05-01 DIAGNOSIS — D513 Other dietary vitamin B12 deficiency anemia: Secondary | ICD-10-CM

## 2024-05-01 DIAGNOSIS — E440 Moderate protein-calorie malnutrition: Secondary | ICD-10-CM | POA: Diagnosis not present

## 2024-05-01 NOTE — Patient Instructions (Signed)
 See assessment and plan under each diagnosis in the problem list and acutely for this visit

## 2024-05-01 NOTE — Assessment & Plan Note (Signed)
 Current H/H is 14/43.7 and B12 level is normal at 595.  Continue to monitor.  Continue B12 supplementation.

## 2024-05-01 NOTE — Assessment & Plan Note (Signed)
 He is pleasant and interactive.  Dementia invalidates his negative review of systems. No behavioral issues reported; no change indicated in meds.

## 2024-05-01 NOTE — Assessment & Plan Note (Signed)
 Most recent total protein was 6.0 and albumin 3.2 in December 2024.  He does exhibit interosseous wasting.  Nutritionist continues to follow him here at the SNF.

## 2024-05-01 NOTE — Progress Notes (Unsigned)
 NURSING HOME LOCATION:  [facility] ROOM NUMBER:  129 P  CODE STATUS:  DNR  PCP: Landy Barnie RAMAN, NP   This is a nursing facility follow up visit of chronic medical diagnoses to document compliance with Regulation 483.30 (c) in The Long Term Care Survey Manual Phase 2 which mandates caregiver visit ( visits can alternate among physician, PA or NP as per statutes) within 10 days of 30 days / 60 days/ 90 days post admission to SNF date  .  Interim medical record and care since last SNF visit was updated with review of diagnostic studies and change in clinical status since last visit were documented.  HPI: He is a permanent resident of this facility with medical diagnoses of CAD, dyslipidemia, essential hypertension, history of B12 deficiency, protein/caloric malnutrition, GERD, CKD stage IIIb, dementia, and failure to thrive syndrome. On 4/4 B12 level was normal at 595 and CBC normal with no macrocytosis.  Serially the H/H had improved from 11.8/35.7 up to 14/43.7.  TSH was therapeutic.  Total protein and albumin were last checked in December 2024 and revealed values of 6.0 and 3.2 respectively.  Review of systems: He denies any active symptoms on complete review of systems. Constitutional: No fever, significant weight change, fatigue  Eyes: No redness, discharge, pain, vision change ENT/mouth: No nasal congestion,  purulent discharge, earache, change in hearing, sore throat  Cardiovascular: No chest pain, palpitations, paroxysmal nocturnal dyspnea, claudication, edema  Respiratory: No cough, sputum production, hemoptysis, DOE, significant snoring, apnea   Gastrointestinal: No heartburn, dysphagia, abdominal pain, nausea /vomiting, rectal bleeding, melena, change in bowels Genitourinary: No dysuria, hematuria, pyuria, incontinence, nocturia Musculoskeletal: No joint stiffness, joint swelling, weakness, pain Dermatologic: No rash, pruritus, change in appearance of  skin Neurologic: No dizziness, headache, syncope, seizures, numbness, tingling Psychiatric: No significant anxiety, depression, insomnia, anorexia Endocrine: No change in hair/skin/nails, excessive thirst, excessive hunger, excessive urination  Hematologic/lymphatic: No significant bruising, lymphadenopathy, abnormal bleeding Allergy/immunology: No itchy/watery eyes, significant sneezing, urticaria, angioedema  Physical exam:  Pertinent or positive findings: He was initially asleep; he was not exhibiting hypopnea, snoring, or frank apnea.  Alopecia over the crown is present and he has a comb over.  There is slight ptosis on the left.  Lacrimal glands are prominent.  The left nasolabial fold is decreased.  Grade 1/2 systolic murmur is suggested.  Breath sounds are decreased.  Dorsalis pedis pulses are very strong.  Interosseous wasting and limb atrophy is present.  General appearance: Adequately nourished; no acute distress, increased work of breathing is present.   Lymphatic: No lymphadenopathy about the head, neck, axilla. Eyes: No conjunctival inflammation or lid edema is present. There is no scleral icterus. Ears:  External ear exam shows no significant lesions or deformities.   Nose:  External nasal examination shows no deformity or inflammation. Nasal mucosa are pink and moist without lesions, exudates Oral exam:  Lips and gums are healthy appearing. There is no oropharyngeal erythema or exudate. Neck:  No thyromegaly, masses, tenderness noted.    Heart:  Normal rate and regular rhythm. S1 and S2 normal without gallop, murmur, click, rub .  Lungs: Chest clear to auscultation without wheezes, rhonchi, rales, rubs. Abdomen: Bowel sounds are normal. Abdomen is soft and nontender with no organomegaly, hernias, masses. GU: Deferred  Extremities:  No cyanosis, clubbing, edema  Neurologic exam : Cn 2-7 intact Strength equal  in upper & lower extremities Balance, Rhomberg, finger to nose  testing could not be completed  due to clinical state Deep tendon reflexes are equal Skin: Warm & dry w/o tenting. No significant lesions or rash.  See summary under each active problem in the Problem List with associated updated therapeutic plan

## 2024-05-24 DIAGNOSIS — Z9181 History of falling: Secondary | ICD-10-CM | POA: Diagnosis not present

## 2024-05-24 DIAGNOSIS — N1832 Chronic kidney disease, stage 3b: Secondary | ICD-10-CM | POA: Diagnosis not present

## 2024-05-24 DIAGNOSIS — M6281 Muscle weakness (generalized): Secondary | ICD-10-CM | POA: Diagnosis not present

## 2024-05-24 DIAGNOSIS — M4326 Fusion of spine, lumbar region: Secondary | ICD-10-CM | POA: Diagnosis not present

## 2024-05-24 DIAGNOSIS — R262 Difficulty in walking, not elsewhere classified: Secondary | ICD-10-CM | POA: Diagnosis not present

## 2024-06-01 DIAGNOSIS — M6281 Muscle weakness (generalized): Secondary | ICD-10-CM | POA: Diagnosis not present

## 2024-06-01 DIAGNOSIS — R262 Difficulty in walking, not elsewhere classified: Secondary | ICD-10-CM | POA: Diagnosis not present

## 2024-06-01 DIAGNOSIS — M4326 Fusion of spine, lumbar region: Secondary | ICD-10-CM | POA: Diagnosis not present

## 2024-06-01 DIAGNOSIS — Z9181 History of falling: Secondary | ICD-10-CM | POA: Diagnosis not present

## 2024-06-01 DIAGNOSIS — N1832 Chronic kidney disease, stage 3b: Secondary | ICD-10-CM | POA: Diagnosis not present

## 2024-06-07 ENCOUNTER — Non-Acute Institutional Stay (SKILLED_NURSING_FACILITY): Payer: Self-pay | Admitting: Adult Health

## 2024-06-07 ENCOUNTER — Encounter: Payer: Self-pay | Admitting: Adult Health

## 2024-06-07 DIAGNOSIS — M47817 Spondylosis without myelopathy or radiculopathy, lumbosacral region: Secondary | ICD-10-CM

## 2024-06-07 DIAGNOSIS — N1832 Chronic kidney disease, stage 3b: Secondary | ICD-10-CM

## 2024-06-07 DIAGNOSIS — R627 Adult failure to thrive: Secondary | ICD-10-CM | POA: Diagnosis not present

## 2024-06-07 DIAGNOSIS — Z66 Do not resuscitate: Secondary | ICD-10-CM | POA: Diagnosis not present

## 2024-06-07 NOTE — Progress Notes (Unsigned)
 Location:  [facility] Room Number: 129-P Place of Service:  SNF (31) Provider:  Landy Barnie RAMAN, NP  Patient Care Team: Landy Barnie RAMAN, NP as PCP - General (Geriatric Medicine)  Extended Emergency Contact Information Primary Emergency Contact: Feng,Sandra Address: [address]          [place], [place] [address] [place] Home Phone: [phone] Mobile Phone: [phone] Relation: Spouse Secondary Emergency Contact: Munday,Craig Mobile Phone: [phone] Relation: Son  Code Status:  DNR Goals of care: Advanced Directive information    04/13/2024    9:52 AM  Advanced Directives  Does Patient Have a Medical Advance Directive? Yes  Type of Advance Directive Out of facility DNR (pink MOST or yellow form)  Does patient want to make changes to medical advance directive? No - Patient declined     Chief Complaint  Patient presents with   Medical Management of Chronic Issues    HPI:  Pt is a 83 y.o. male seen today for medical management of chronic diseases.     Past Medical History:  Diagnosis Date   Coronary artery disease    does not see a cardiologist, reports no cardiac symptoms   Hyperlipidemia    Hypertension    Seasonal allergies    Past Surgical History:  Procedure Laterality Date   BACK SURGERY     fusion,rods and cage   CATARACT EXTRACTION W/ INTRAOCULAR LENS IMPLANT Bilateral    per wife   COLONOSCOPY  07/31/2011   Procedure: COLONOSCOPY;  Surgeon: Oneil DELENA Budge;  [facility]: [facility];  Service: Gastroenterology;  Laterality: N/A;   RETINAL DETACHMENT SURGERY Left    wife believes it was in the left eye   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   Procedure: RIGHT REVERSE SHOULDER ARTHROPLASTY;  Surgeon: Eva Herring, MD;  Location: [facility] OR;  Service: Orthopedics;  Laterality: Right;  Right reverse total shoulder    Allergies  Allergen Reactions   Hydrochlorothiazide  Other (See Comments)     hypokalemia    Outpatient Encounter Medications as of 06/07/2024  Medication Sig   ammonium lactate (LAC-HYDRIN) 12 % lotion Apply 1 Application topically daily.   calcium  carbonate (TUMS - DOSED IN MG ELEMENTAL CALCIUM ) 500 MG chewable tablet Chew 1 tablet by mouth 3 (three) times daily.   cyanocobalamin  1000 MCG tablet Take 2,000 mcg by mouth daily.   feeding supplement (ENSURE ENLIVE / ENSURE PLUS) LIQD Take 0.08 mLs by mouth daily.   loratadine  (CLARITIN ) 10 MG tablet Take 10 mg by mouth at bedtime.   memantine  (NAMENDA ) 10 MG tablet Take 1 tablet (10 mg total) by mouth 2 (two) times daily.   omeprazole (PRILOSEC) 20 MG capsule Take 20 mg by mouth daily.   potassium chloride  SA (KLOR-CON  M) 20 MEQ tablet Take 20 mEq by mouth 2 (two) times daily.   Protein (PROSOURCE PO) Take 30 mLs by mouth 3 (three) times daily.   tamsulosin (FLOMAX) 0.4 MG CAPS capsule Take 0.4 mg by mouth daily.   UNABLE TO FIND Take 1 Container by mouth 3 (three) times daily with meals. Med Name: magic cup  Also protein cookie in between meals   acetaminophen  (TYLENOL ) 325 MG tablet Take 2 tablets (650 mg total) by mouth every 6 (six) hours as needed for mild pain (pain score 1-3) (or Fever >/= 100.4).   albuterol (VENTOLIN HFA) 108 (90 Base) MCG/ACT inhaler Inhale into the lungs every 6 (six) hours as needed for wheezing or shortness  of breath.   bisacodyl  (DULCOLAX) 5 MG EC tablet Take 1 tablet (5 mg total) by mouth daily as needed for moderate constipation.   Eyelid Cleansers (OCUSOFT LID SCRUB EX) Apply topically as needed.   polyethylene glycol (MIRALAX  / GLYCOLAX ) 17 g packet Take 17 g by mouth daily as needed for mild constipation.   No facility-administered encounter medications on file as of 06/07/2024.    Review of Systems  Immunization History  Administered Date(s) Administered    sv, Bivalent, Protein Subunit Rsvpref,pf (Abrysvo) 09/17/2022   Fluad Quad(high Dose 65+) 08/02/2023   Fluad  Trivalent(High Dose 65+) 08/02/2023   INFLUENZA, HIGH DOSE SEASONAL PF 07/29/2019   Moderna Covid-19 Vaccine Bivalent Booster 67yrs & up 11/10/2021   Moderna SARS-COV2 Booster Vaccination 01/11/2024   Moderna Sars-Covid-2 Vaccination 10/26/2019, 11/25/2019, 06/27/2020, 04/07/2021, 06/24/2023   Tdap 09/17/2023   Pertinent  Health Maintenance Due  Topic Date Due   INFLUENZA VACCINE  05/12/2024      09/27/2023    5:16 PM 10/22/2023    6:42 PM 11/09/2023    3:28 PM 02/01/2024   12:04 PM  Fall Risk  Falls in the past year? 1 1 1 1   Was there an injury with Fall? 1 1 1  0  Fall Risk Category Calculator 3 2 3 1   Patient at Risk for Falls Due to History of fall(s);Impaired balance/gait;Impaired mobility;Mental status change;Impaired vision History of fall(s);Impaired vision;Impaired balance/gait;Medication side effect;Impaired mobility;Mental status change History of fall(s);Impaired balance/gait;Impaired mobility History of fall(s);Impaired mobility;Medication side effect  Fall risk Follow up Falls evaluation completed;Education provided;Falls prevention discussed Follow up appointment;Falls evaluation completed;Education provided;Falls prevention discussed     Functional Status Survey:    Vitals:   06/07/24 0908  BP: 138/66  SpO2: 92%  Weight: 130 lb 9.6 oz (59.2 kg)  Height: 5' 8 (1.727 m)   Body mass index is 19.86 kg/m. Physical Exam  Labs reviewed: Recent Labs    09/25/23 0417 09/27/23 1603 10/04/23 0500 10/07/23 0625 11/29/23 0741  NA 137   < > 142 136 138  K 3.7   < > 3.3* 3.5 3.8  CL 106   < > 106 102 103  CO2 23   < > 27 24 27   GLUCOSE 88   < > 91 73 96  BUN 30*   < > 23 23 25*  CREATININE 1.14   < > 0.97 0.92 0.95  CALCIUM  8.4*   < > 8.1* 8.1* 9.2  MG 1.8  --   --   --   --    < > = values in this interval not displayed.   Recent Labs    09/23/23 1558 09/24/23 0404 09/27/23 1603  AST 33 31 95*  ALT 12 10 24   ALKPHOS 64 45 46  BILITOT 1.6* 1.1 1.0   PROT 7.0 5.3* 6.2*  ALBUMIN 3.9 3.0* 3.0*   Recent Labs    09/23/23 0953 09/24/23 0404 09/27/23 1603 09/29/23 0413 10/04/23 0500 01/14/24 0415  WBC 5.3   < > 9.5 4.1 6.4 5.9  NEUTROABS 3.9  --  8.6*  --   --  3.1  HGB 15.2   < > 12.8* 10.4* 11.8* 14.0  HCT 46.7   < > 39.9 32.1* 35.7* 43.7  MCV 88.8   < > 88.9 90.4 87.9 88.6  PLT 193   < > 193 167 286 186   < > = values in this interval not displayed.   Lab Results  Component Value Date  TSH 3.207 01/14/2024   No results found for: HGBA1C No results found for: CHOL, HDL, LDLCALC, LDLDIRECT, TRIG, CHOLHDL  Significant Diagnostic Results in last 30 days:  No results found.  Assessment/Plan 1. DNR (do not resuscitate) (Primary) ***    Family/ staff Communication: ***  Labs/tests ordered:  ***

## 2024-06-12 DIAGNOSIS — Z9181 History of falling: Secondary | ICD-10-CM | POA: Diagnosis not present

## 2024-06-12 DIAGNOSIS — M4326 Fusion of spine, lumbar region: Secondary | ICD-10-CM | POA: Diagnosis not present

## 2024-06-12 DIAGNOSIS — R262 Difficulty in walking, not elsewhere classified: Secondary | ICD-10-CM | POA: Diagnosis not present

## 2024-06-12 DIAGNOSIS — M6281 Muscle weakness (generalized): Secondary | ICD-10-CM | POA: Diagnosis not present

## 2024-06-13 DIAGNOSIS — R262 Difficulty in walking, not elsewhere classified: Secondary | ICD-10-CM | POA: Diagnosis not present

## 2024-06-13 DIAGNOSIS — N1832 Chronic kidney disease, stage 3b: Secondary | ICD-10-CM | POA: Diagnosis not present

## 2024-06-13 DIAGNOSIS — Z9181 History of falling: Secondary | ICD-10-CM | POA: Diagnosis not present

## 2024-06-13 DIAGNOSIS — M4326 Fusion of spine, lumbar region: Secondary | ICD-10-CM | POA: Diagnosis not present

## 2024-06-13 DIAGNOSIS — M6281 Muscle weakness (generalized): Secondary | ICD-10-CM | POA: Diagnosis not present

## 2024-06-14 DIAGNOSIS — M6281 Muscle weakness (generalized): Secondary | ICD-10-CM | POA: Diagnosis not present

## 2024-06-14 DIAGNOSIS — R262 Difficulty in walking, not elsewhere classified: Secondary | ICD-10-CM | POA: Diagnosis not present

## 2024-06-14 DIAGNOSIS — Z9181 History of falling: Secondary | ICD-10-CM | POA: Diagnosis not present

## 2024-06-14 DIAGNOSIS — M4326 Fusion of spine, lumbar region: Secondary | ICD-10-CM | POA: Diagnosis not present

## 2024-06-15 DIAGNOSIS — Z9181 History of falling: Secondary | ICD-10-CM | POA: Diagnosis not present

## 2024-06-15 DIAGNOSIS — M6281 Muscle weakness (generalized): Secondary | ICD-10-CM | POA: Diagnosis not present

## 2024-06-15 DIAGNOSIS — M4326 Fusion of spine, lumbar region: Secondary | ICD-10-CM | POA: Diagnosis not present

## 2024-06-15 DIAGNOSIS — R262 Difficulty in walking, not elsewhere classified: Secondary | ICD-10-CM | POA: Diagnosis not present

## 2024-06-16 DIAGNOSIS — M4326 Fusion of spine, lumbar region: Secondary | ICD-10-CM | POA: Diagnosis not present

## 2024-06-16 DIAGNOSIS — Z9181 History of falling: Secondary | ICD-10-CM | POA: Diagnosis not present

## 2024-06-16 DIAGNOSIS — M6281 Muscle weakness (generalized): Secondary | ICD-10-CM | POA: Diagnosis not present

## 2024-06-16 DIAGNOSIS — R262 Difficulty in walking, not elsewhere classified: Secondary | ICD-10-CM | POA: Diagnosis not present

## 2024-06-19 DIAGNOSIS — M6281 Muscle weakness (generalized): Secondary | ICD-10-CM | POA: Diagnosis not present

## 2024-06-19 DIAGNOSIS — M4326 Fusion of spine, lumbar region: Secondary | ICD-10-CM | POA: Diagnosis not present

## 2024-06-19 DIAGNOSIS — Z9181 History of falling: Secondary | ICD-10-CM | POA: Diagnosis not present

## 2024-06-19 DIAGNOSIS — R262 Difficulty in walking, not elsewhere classified: Secondary | ICD-10-CM | POA: Diagnosis not present

## 2024-06-20 DIAGNOSIS — M4326 Fusion of spine, lumbar region: Secondary | ICD-10-CM | POA: Diagnosis not present

## 2024-06-20 DIAGNOSIS — M6281 Muscle weakness (generalized): Secondary | ICD-10-CM | POA: Diagnosis not present

## 2024-06-20 DIAGNOSIS — Z9181 History of falling: Secondary | ICD-10-CM | POA: Diagnosis not present

## 2024-06-20 DIAGNOSIS — R262 Difficulty in walking, not elsewhere classified: Secondary | ICD-10-CM | POA: Diagnosis not present

## 2024-06-21 DIAGNOSIS — M6281 Muscle weakness (generalized): Secondary | ICD-10-CM | POA: Diagnosis not present

## 2024-06-21 DIAGNOSIS — R262 Difficulty in walking, not elsewhere classified: Secondary | ICD-10-CM | POA: Diagnosis not present

## 2024-06-21 DIAGNOSIS — Z9181 History of falling: Secondary | ICD-10-CM | POA: Diagnosis not present

## 2024-06-21 DIAGNOSIS — N1832 Chronic kidney disease, stage 3b: Secondary | ICD-10-CM | POA: Diagnosis not present

## 2024-06-21 DIAGNOSIS — M4326 Fusion of spine, lumbar region: Secondary | ICD-10-CM | POA: Diagnosis not present

## 2024-07-11 ENCOUNTER — Encounter: Payer: Self-pay | Admitting: Adult Health

## 2024-07-11 ENCOUNTER — Non-Acute Institutional Stay (SKILLED_NURSING_FACILITY): Payer: Self-pay | Admitting: Adult Health

## 2024-07-11 DIAGNOSIS — I10 Essential (primary) hypertension: Secondary | ICD-10-CM | POA: Diagnosis not present

## 2024-07-11 DIAGNOSIS — E876 Hypokalemia: Secondary | ICD-10-CM | POA: Diagnosis not present

## 2024-07-11 DIAGNOSIS — I700 Atherosclerosis of aorta: Secondary | ICD-10-CM

## 2024-07-11 NOTE — Progress Notes (Signed)
 Location:  [facility] Room Number: 129 Place of Service:  SNF (31)   CODE STATUS: dnr   Allergies  Allergen Reactions   Hydrochlorothiazide  Other (See Comments)    hypokalemia    Chief Complaint  Patient presents with   Medical Management of Chronic Issues         Hypokalemia:    Aortic atherosclerosis     Primary hypertension    HPI:  He is a 83 y.o. long term resident of this facility being seen for the management of his chronic illnesses:  Hypokalemia:    Aortic atherosclerosis     Primary hypertension. Blood pressure readings are within range. There are no reports of uncontrolled pain.    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   Procedure: RIGHT REVERSE SHOULDER ARTHROPLASTY;  Surgeon: Eva Herring, MD;  Location: [facility] OR;  Service: Orthopedics;  Laterality: Right;  Right reverse total shoulder    Social History   Socioeconomic History   Marital status: Married    Spouse name: Joshuajames Moehring   Number of children: 2   Years of education: college   Highest education level: Some college, no degree  Occupational History   Occupation: retired    Associate Professor: RETIRED  Tobacco Use   Smoking status: Former    Current packs/day: 0.00    Average packs/day: 1.5 packs/day for 40.0 years (60.0 ttl pk-yrs)    Types: Cigarettes    Start date: 10/12/1950    Quit date: 10/12/1990     Years since quitting: 33.7    Passive exposure: Past   Smokeless tobacco: Never  Vaping Use   Vaping status: Never Used  Substance and Sexual Activity   Alcohol use: No   Drug use: No   Sexual activity: Not Currently    Partners: Female  Other Topics Concern   Not on file  Social History Narrative   Not on file   Social Drivers of Health   Financial Resource Strain: Low Risk  (09/27/2023)   Overall Financial Resource Strain (CARDIA)    Difficulty of Paying Living Expenses: Not hard at all  Food Insecurity: No Food Insecurity (09/28/2023)   Hunger Vital Sign    Worried About Running Out of Food in the Last Year: Never true    Ran Out of Food in the Last Year: Never true  Transportation Needs: No Transportation Needs (09/28/2023)   PRAPARE - Administrator, Civil Service (Medical): No    Lack of Transportation (Non-Medical): No  Physical Activity: Sufficiently Active (09/27/2023)   Exercise Vital Sign    Days of Exercise per Week: 7 days    Minutes of Exercise per Session: 30 min  Recent Concern: Physical Activity - Inactive (09/27/2023)   Exercise Vital Sign    Days of Exercise per Week: 0 days  Minutes of Exercise per Session: 0 min  Stress: No Stress Concern Present (09/27/2023)   [employer] of Occupational Health - Occupational Stress Questionnaire    Feeling of Stress : Not at all  Social Connections: Moderately Integrated (09/27/2023)   Social Connection and Isolation Panel    Frequency of Communication with Friends and Family: More than three times a week    Frequency of Social Gatherings with Friends and Family: More than three times a week    Attends Religious Services: 1 to 4 times per year    Active Member of [employer] or Organizations: No    Attends Banker Meetings: Never    Marital Status: Married  Catering manager Violence: Not At Risk (09/28/2023)   Humiliation, Afraid, Rape, and Kick questionnaire    Fear of Current or  Ex-Partner: No    Emotionally Abused: No    Physically Abused: No    Sexually Abused: No   Family History  Problem Relation Age of Onset   Heart disease Unknown    Asthma Unknown       VITAL SIGNS BP (!) 145/75   Pulse (!) 58   Temp (!) 97.3 F (36.3 C)   Resp 20   Ht 5' 8 (1.727 m)   Wt 134 lb (60.8 kg)   SpO2 98%   BMI 20.37 kg/m   Outpatient Encounter Medications as of 07/11/2024  Medication Sig   acetaminophen  (TYLENOL ) 325 MG tablet Take 2 tablets (650 mg total) by mouth every 6 (six) hours as needed for mild pain (pain score 1-3) (or Fever >/= 100.4).   albuterol (VENTOLIN HFA) 108 (90 Base) MCG/ACT inhaler Inhale into the lungs every 6 (six) hours as needed for wheezing or shortness of breath.   ammonium lactate (LAC-HYDRIN) 12 % lotion Apply 1 Application topically daily.   bisacodyl  (DULCOLAX) 5 MG EC tablet Take 1 tablet (5 mg total) by mouth daily as needed for moderate constipation.   calcium  carbonate (TUMS - DOSED IN MG ELEMENTAL CALCIUM ) 500 MG chewable tablet Chew 1 tablet by mouth 3 (three) times daily.   cyanocobalamin  1000 MCG tablet Take 2,000 mcg by mouth daily.   Eyelid Cleansers (OCUSOFT LID SCRUB EX) Apply topically as needed.   feeding supplement (ENSURE ENLIVE / ENSURE PLUS) LIQD Take 0.08 mLs by mouth daily.   loratadine  (CLARITIN ) 10 MG tablet Take 10 mg by mouth at bedtime.   memantine  (NAMENDA ) 10 MG tablet Take 1 tablet (10 mg total) by mouth 2 (two) times daily.   omeprazole (PRILOSEC) 20 MG capsule Take 20 mg by mouth daily.   polyethylene glycol (MIRALAX  / GLYCOLAX ) 17 g packet Take 17 g by mouth daily as needed for mild constipation.   potassium chloride  SA (KLOR-CON  M) 20 MEQ tablet Take 20 mEq by mouth 2 (two) times daily.   Protein (PROSOURCE PO) Take 30 mLs by mouth 3 (three) times daily.   tamsulosin (FLOMAX) 0.4 MG CAPS capsule Take 0.4 mg by mouth daily.   UNABLE TO FIND Take 1 Container by mouth 3 (three) times daily with meals. Med  Name: magic cup  Also protein cookie in between meals   No facility-administered encounter medications on file as of 07/11/2024.     SIGNIFICANT DIAGNOSTIC EXAMS  PREVIOUS   09-27-23: wbc 9.5; hgb 12.8; hct 39.9; mcv 88.9 plt 193; glucose 105; bun 39; creat 1.39; k+ 3.5; na++ 145; ca 9.4 gfr 50; protein 6.2 albumin 3.0; CK 2986 09-29-23: wbc 4.1; hgb 10.4;  hct 32.1; mcv 90.4 plt 167 glucose 84; bun 43; creat 1.23; k+ 3.0; na++ 141; ca 7.7; gfr 58; CK 988 10-04-23: wbc 6.4; hgb 11.8; hct 35.7; mcv 87.9 plt 286; glucose 91; bun 23; creat 0.97; k+ 3.3; na++ 142; ca 8.1; gfr >60  10-07-23: glucose 73; bun 23; creat 0.92; k+ 3.5; na++ 136; ca 8.1; gfr >60 CK 27  TODAY  01-14-24 wbc 5.9; hgb 14.0; hct 43.7; mcv 88.6 plt 186; vitamin B 12: 595 tsh 3.207    Review of Systems  Unable to perform ROS: Dementia    Physical Exam Constitutional:      General: He is not in acute distress.    Appearance: He is well-developed. He is not diaphoretic.  Neck:     Thyroid : No thyromegaly.  Cardiovascular:     Rate and Rhythm: Normal rate and regular rhythm.     Heart sounds: Normal heart sounds.  Pulmonary:     Effort: Pulmonary effort is normal. No respiratory distress.     Breath sounds: Normal breath sounds.  Abdominal:     General: Bowel sounds are normal. There is no distension.     Palpations: Abdomen is soft.     Tenderness: There is no abdominal tenderness.  Musculoskeletal:        General: Normal range of motion.     Cervical back: Neck supple.     Right lower leg: No edema.     Left lower leg: No edema.  Lymphadenopathy:     Cervical: No cervical adenopathy.  Skin:    General: Skin is warm and dry.  Neurological:     Mental Status: He is alert. Mental status is at baseline.     Comments: BCAT 17/50   Psychiatric:        Mood and Affect: Mood normal.     ASSESSMENT/ PLAN:  TODAY  Hypokalemia: k+ 3.5 will continue k+ 20 meq twice daily   2. Aortic atherosclerosis (ct  09-23-23); is not on statin due to advanced age  20. Primary hypertension: b/p 145/75  PREVIOUS   4. Chronic non-seasonal allergic rhinitis: will continue xyzal 5 mg daily  5. GERD without esophagitis: will continue prilosec 20 mg daily   6. Chronic constipation: will continue miralax  daily as needed  7. Hypocalcemia will continue tums three times daily   8. 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 His weight is stable at 134 pounds; will continue namenda  10 mg twice daily   9. Lumbosacral spondylosis without myelopathy: pain is presently managed will monitor  10. Chronic kidney disease stage 3b: bun 23; creat 0.97; gfr >60  11. Failure to thrive in adult: weight is 134 pounds is presently stable.     Barnie Seip NP [facility]  call [phone]

## 2024-07-13 DIAGNOSIS — Z23 Encounter for immunization: Secondary | ICD-10-CM | POA: Diagnosis not present

## 2024-07-17 DIAGNOSIS — L602 Onychogryphosis: Secondary | ICD-10-CM | POA: Diagnosis not present

## 2024-07-17 DIAGNOSIS — I739 Peripheral vascular disease, unspecified: Secondary | ICD-10-CM | POA: Diagnosis not present

## 2024-07-17 DIAGNOSIS — L603 Nail dystrophy: Secondary | ICD-10-CM | POA: Diagnosis not present

## 2024-07-24 ENCOUNTER — Non-Acute Institutional Stay (SKILLED_NURSING_FACILITY): Payer: Self-pay | Admitting: Internal Medicine

## 2024-07-24 ENCOUNTER — Encounter: Payer: Self-pay | Admitting: Internal Medicine

## 2024-07-24 DIAGNOSIS — D513 Other dietary vitamin B12 deficiency anemia: Secondary | ICD-10-CM | POA: Diagnosis not present

## 2024-07-24 DIAGNOSIS — I700 Atherosclerosis of aorta: Secondary | ICD-10-CM

## 2024-07-24 DIAGNOSIS — F0390 Unspecified dementia without behavioral disturbance: Secondary | ICD-10-CM

## 2024-07-24 DIAGNOSIS — E440 Moderate protein-calorie malnutrition: Secondary | ICD-10-CM | POA: Diagnosis not present

## 2024-07-24 NOTE — Patient Instructions (Signed)
 See assessment and plan under each diagnosis in the problem list and acutely for this visit

## 2024-07-24 NOTE — Assessment & Plan Note (Addendum)
 Limb atrophy and interosseous wasting present.  Nutritionist continues to follow. Continue protein supplementation.

## 2024-07-24 NOTE — Assessment & Plan Note (Signed)
 He was very pleasant and interactive.  He denied any active symptoms.  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  Staff has not reported any behavioral issues.

## 2024-07-24 NOTE — Progress Notes (Signed)
 NURSING HOME LOCATION:  [facility] ROOM NUMBER:  [id number]  CODE STATUS:  DNR  PCP: Landy Barnie RAMAN, NP   This is a nursing facility follow up visit  of chronic medical diagnoses to document compliance with Regulation 483.30 (c) in The Long Term Care Survey Manual Phase 2 which mandates caregiver visit ( visits can alternate among physician, PA or NP as per statutes) within 10 days of 30 days / 60 days/ 90 days post admission to SNF date  .  Interim medical record and care since last SNF visit was updated with review of diagnostic studies and change in clinical status since last visit were documented.  HPI:He is a permanent resident of this facility with medical diagnoses of reactive airways disease; COPD; hypocalcemia; extrinsic rhinitis; GERD; protein/caloric malnutrition; CAD; dyslipidemia; essential hypertension;B12 deficiency; and major neurocognitive disorder.  Most recent labs were performed 4//25 and revealed a therapeutic B12 level of 595.  CBC and differential was normal with no macrocytosis.  TSH was therapeutic at 3.207.  Review of systems: Dementia invalidated responses.  He denied any active symptoms.  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  He could not tell me the etiology of the blindness or how long it has been present.  I told him I heard that he had sung in his church choir and asked if he had any interest in singing here at the facility.  He appreciated the complement but did not reply as to that option.  Constitutional: No fever, significant weight change, fatigue  Eyes: No redness, discharge, pain, new vision change ENT/mouth: No nasal congestion,  purulent discharge, earache, change in hearing, sore throat  Cardiovascular: No chest pain, palpitations, paroxysmal nocturnal dyspnea, edema  Respiratory: No cough, sputum production, hemoptysis, DOE, significant snoring, apnea   Gastrointestinal: No heartburn, dysphagia,  abdominal pain, nausea /vomiting, rectal bleeding, melena, change in bowels Genitourinary: No dysuria, hematuria, pyuria, incontinence, nocturia Musculoskeletal: No joint stiffness, joint swelling, weakness, pain Dermatologic: No rash, pruritus, change in appearance of skin Neurologic: No dizziness, headache, syncope, seizures, numbness, tingling Psychiatric: No significant anxiety, depression, insomnia, anorexia Endocrine: No change in hair/skin/nails, excessive thirst, excessive hunger, excessive urination  Hematologic/lymphatic: No significant bruising, lymphadenopathy, abnormal bleeding Allergy/immunology: No itchy/watery eyes, significant sneezing, urticaria, angioedema  Physical exam:  Pertinent or positive findings: He appears his age and suboptimally nourished.  Initially he was asleep but woke easily.  While asleep he did not exhibit hypopnea, snoring, or frank apnea.  Pattern alopecia is present.  The lower lids are slightly puffy.  The left iris is slightly darker than the right and the left pupil has a distorted contour.  The left nasolabial fold is decreased.  Breath sounds are decreased and heart sounds are distant.  Posterior tibial pulses are stronger than dorsalis pedis pulses.  There is slight clubbing of the nailbeds.  Limb atrophy and interosseous wasting are noted.  General appearance:  no acute distress, increased work of breathing is present.   Lymphatic: No lymphadenopathy about the head, neck, axilla. Eyes: No conjunctival inflammation or lid edema is present. There is no scleral icterus. Ears:  External ear exam shows no significant lesions or deformities.   Nose:  External nasal examination shows no deformity or inflammation. Nasal mucosa are pink and moist without lesions, exudates Oral exam:  Lips and gums are healthy appearing. There is no oropharyngeal erythema or exudate. Neck:  No thyromegaly, masses, tenderness noted.  Heart:  No gallop, murmur, click, rub  auscultated.  Lungs:  without wheezes, rhonchi, rales, rubs. Abdomen: Bowel sounds are normal. Abdomen is soft and nontender with no organomegaly, hernias, masses. GU: Deferred  Extremities:  No cyanosis, edema  Neurologic exam :Balance, Rhomberg, finger to nose testing could not be completed due to clinical state Skin: Warm & dry w/o tenting. No significant lesions or rash.  See summary under each active problem in the Problem List with associated updated therapeutic plan:  Unspecified protein-calorie malnutrition Limb atrophy and interosseous wasting present.  Nutritionist continues to follow. Continue protein supplementation.  B12 deficiency anemia Current B12 level is therapeutic at 595.  CBC revealed no macrocytosis or anemia.  No change indicated in B12 supplement dose.  Major neurocognitive disorder Michael [name]) He was very pleasant and interactive.  He denied any active symptoms.  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  Staff has not reported any behavioral issues.  Aortic atherosclerosis He denies any anginal equivalent at this time.

## 2024-07-24 NOTE — Assessment & Plan Note (Addendum)
 Current B12 level is therapeutic at 595.  CBC revealed no macrocytosis or anemia.  No change indicated in B12 supplement dose.

## 2024-07-24 NOTE — Assessment & Plan Note (Signed)
He denies any anginal equivalent at this time.

## 2024-08-24 ENCOUNTER — Non-Acute Institutional Stay (SKILLED_NURSING_FACILITY): Payer: Self-pay | Admitting: Adult Health

## 2024-08-24 ENCOUNTER — Encounter: Payer: Self-pay | Admitting: Adult Health

## 2024-08-24 DIAGNOSIS — K219 Gastro-esophageal reflux disease without esophagitis: Secondary | ICD-10-CM | POA: Diagnosis not present

## 2024-08-24 DIAGNOSIS — K5909 Other constipation: Secondary | ICD-10-CM | POA: Diagnosis not present

## 2024-08-24 DIAGNOSIS — J3089 Other allergic rhinitis: Secondary | ICD-10-CM

## 2024-08-24 NOTE — Progress Notes (Signed)
 Location:  [facility] Room Number: 129 Place of Service:  SNF (31)   CODE STATUS: dnr  Allergies  Allergen Reactions   Hydrochlorothiazide  Other (See Comments)    hypokalemia    Chief Complaint  Patient presents with   Medical Management of Chronic Issues             Chronic non-seasonal allergic rhinitis: GERD without esophagitis: Chronic constipation    HPI:  He is a 83 y.o. long term resident of this facility being seen for the management of his chronic illnesses:Chronic non-seasonal allergic rhinitis: GERD without esophagitis: Chronic constipation. There have been no reports of constipation; no reports of heart burn no reports of uncontrolled pain.    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   Procedure: RIGHT REVERSE SHOULDER ARTHROPLASTY;  Surgeon: Eva Herring, MD;  Location: [facility] OR;  Service: Orthopedics;  Laterality: Right;  Right reverse total shoulder    Social History   Socioeconomic History   Marital status: Married    Spouse name: Owynn Mosqueda   Number of children: 2   Years of education: college   Highest education level: Some college, no degree  Occupational History   Occupation: retired    Associate Professor: RETIRED  Tobacco Use   Smoking status: Former    Current packs/day: 0.00    Average packs/day: 1.5 packs/day for 40.0 years (60.0 ttl pk-yrs)     Types: Cigarettes    Start date: 10/12/1950    Quit date: 10/12/1990    Years since quitting: 33.8    Passive exposure: Past   Smokeless tobacco: Never  Vaping Use   Vaping status: Never Used  Substance and Sexual Activity   Alcohol use: No   Drug use: No   Sexual activity: Not Currently    Partners: Female  Other Topics Concern   Not on file  Social History Narrative   Not on file   Social Drivers of Health   Financial Resource Strain: Low Risk  (09/27/2023)   Overall Financial Resource Strain (CARDIA)    Difficulty of Paying Living Expenses: Not hard at all  Food Insecurity: No Food Insecurity (09/28/2023)   Hunger Vital Sign    Worried About Running Out of Food in the Last Year: Never true    Ran Out of Food in the Last Year: Never true  Transportation Needs: No Transportation Needs (09/28/2023)   PRAPARE - Administrator, Civil Service (Medical): No    Lack of Transportation (Non-Medical): No  Physical Activity: Sufficiently Active (09/27/2023)   Exercise Vital Sign    Days of Exercise per Week: 7 days    Minutes of Exercise per Session: 30 min  Recent Concern: Physical Activity - Inactive (09/27/2023)   Exercise Vital Sign    Days of Exercise per Week: 0 days  Minutes of Exercise per Session: 0 min  Stress: No Stress Concern Present (09/27/2023)   [employer] of Occupational Health - Occupational Stress Questionnaire    Feeling of Stress : Not at all  Social Connections: Moderately Integrated (09/27/2023)   Social Connection and Isolation Panel    Frequency of Communication with Friends and Family: More than three times a week    Frequency of Social Gatherings with Friends and Family: More than three times a week    Attends Religious Services: 1 to 4 times per year    Active Member of [employer] or Organizations: No    Attends Banker Meetings: Never    Marital Status: Married  Catering Manager Violence: Not At Risk (09/28/2023)    Humiliation, Afraid, Rape, and Kick questionnaire    Fear of Current or Ex-Partner: No    Emotionally Abused: No    Physically Abused: No    Sexually Abused: No   Family History  Problem Relation Age of Onset   Heart disease Unknown    Asthma Unknown       VITAL SIGNS BP 139/63   Pulse (!) 58   Temp 97.6 F (36.4 C)   Resp 20   Ht 5' 8 (1.727 m)   Wt 139 lb 9.6 oz (63.3 kg)   SpO2 95%   BMI 21.23 kg/m   Outpatient Encounter Medications as of 08/24/2024  Medication Sig   acetaminophen  (TYLENOL ) 325 MG tablet Take 2 tablets (650 mg total) by mouth every 6 (six) hours as needed for mild pain (pain score 1-3) (or Fever >/= 100.4).   albuterol (VENTOLIN HFA) 108 (90 Base) MCG/ACT inhaler Inhale into the lungs every 6 (six) hours as needed for wheezing or shortness of breath.   ammonium lactate (LAC-HYDRIN) 12 % lotion Apply 1 Application topically daily.   bisacodyl  (DULCOLAX) 5 MG EC tablet Take 1 tablet (5 mg total) by mouth daily as needed for moderate constipation.   calcium  carbonate (TUMS - DOSED IN MG ELEMENTAL CALCIUM ) 500 MG chewable tablet Chew 1 tablet by mouth 3 (three) times daily.   cyanocobalamin  1000 MCG tablet Take 2,000 mcg by mouth daily.   Eyelid Cleansers (OCUSOFT LID SCRUB EX) Apply topically as needed.   feeding supplement (ENSURE ENLIVE / ENSURE PLUS) LIQD Take 0.08 mLs by mouth daily.   loratadine  (CLARITIN ) 10 MG tablet Take 10 mg by mouth at bedtime.   memantine  (NAMENDA ) 10 MG tablet Take 1 tablet (10 mg total) by mouth 2 (two) times daily.   omeprazole (PRILOSEC) 20 MG capsule Take 20 mg by mouth daily.   polyethylene glycol (MIRALAX  / GLYCOLAX ) 17 g packet Take 17 g by mouth daily as needed for mild constipation.   potassium chloride  SA (KLOR-CON  M) 20 MEQ tablet Take 20 mEq by mouth 2 (two) times daily.   Protein (PROSOURCE PO) Take 30 mLs by mouth 3 (three) times daily.   tamsulosin (FLOMAX) 0.4 MG CAPS capsule Take 0.4 mg by mouth daily.    UNABLE TO FIND Take 1 Container by mouth 3 (three) times daily with meals. Med Name: magic cup  Also protein cookie in between meals   No facility-administered encounter medications on file as of 08/24/2024.     SIGNIFICANT DIAGNOSTIC EXAMS   PREVIOUS   09-27-23: wbc 9.5; hgb 12.8; hct 39.9; mcv 88.9 plt 193; glucose 105; bun 39; creat 1.39; k+ 3.5; na++ 145; ca 9.4 gfr 50; protein 6.2 albumin 3.0; CK 2986 09-29-23: wbc 4.1; hgb  10.4; hct 32.1; mcv 90.4 plt 167 glucose 84; bun 43; creat 1.23; k+ 3.0; na++ 141; ca 7.7; gfr 58; CK 988 10-04-23: wbc 6.4; hgb 11.8; hct 35.7; mcv 87.9 plt 286; glucose 91; bun 23; creat 0.97; k+ 3.3; na++ 142; ca 8.1; gfr >60  10-07-23: glucose 73; bun 23; creat 0.92; k+ 3.5; na++ 136; ca 8.1; gfr >60 CK 27  TODAY  01-14-24 wbc 5.9; hgb 14.0; hct 43.7; mcv 88.6 plt 186; vitamin B 12: 595 tsh 3.207    Review of Systems  Unable to perform ROS: Dementia    Physical Exam Constitutional:      General: He is not in acute distress.    Appearance: He is well-developed. He is not diaphoretic.  Neck:     Thyroid : No thyromegaly.  Cardiovascular:     Rate and Rhythm: Normal rate and regular rhythm.     Heart sounds: Normal heart sounds.  Pulmonary:     Effort: Pulmonary effort is normal. No respiratory distress.     Breath sounds: Normal breath sounds.  Abdominal:     General: Bowel sounds are normal. There is no distension.     Palpations: Abdomen is soft.     Tenderness: There is no abdominal tenderness.  Musculoskeletal:        General: Normal range of motion.     Cervical back: Neck supple.  Lymphadenopathy:     Cervical: No cervical adenopathy.  Skin:    General: Skin is warm and dry.  Neurological:     Mental Status: He is alert. Mental status is at baseline.     Comments:  BCAT 17/50  Psychiatric:        Mood and Affect: Mood normal.     ASSESSMENT/ PLAN:  TODAY  Chronic non-seasonal allergic rhinitis: will continue claritin  10 mg  daily   2. GERD without esophagitis: will continue prilosec 20 mg daily   3. Chronic constipation: will continue miralax  daily as needed   PREVIOUS   4. Hypocalcemia will continue tums three times daily   5. 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 His weight is stable at 139 pounds; will continue namenda  10 mg twice daily   6. Lumbosacral spondylosis without myelopathy: pain is presently managed will monitor  7. Chronic kidney disease stage 3b: bun 23; creat 0.97; gfr >60  8. Failure to thrive in adult: weight is 139 pounds is presently stable.   9. Hypokalemia: k+ 3.5 will continue k+ 20 meq twice daily   10. Aortic atherosclerosis (ct 09-23-23); is not on statin due to advanced age  77. Primary hypertension: b/p 139/63    Barnie Seip NP [facility]   call [phone]

## 2024-08-31 ENCOUNTER — Other Ambulatory Visit (HOSPITAL_COMMUNITY)
Admission: RE | Admit: 2024-08-31 | Discharge: 2024-08-31 | Disposition: A | Discharge status: Home / Self Care | Source: Skilled Nursing Facility | Attending: Adult Health | Admitting: Adult Health

## 2024-08-31 DIAGNOSIS — I10 Essential (primary) hypertension: Secondary | ICD-10-CM | POA: Insufficient documentation

## 2024-08-31 LAB — CBC
HCT: 42.1 % (ref 39.0–52.0)
Hemoglobin: 13.4 g/dL (ref 13.0–17.0)
MCH: 29 pg (ref 26.0–34.0)
MCHC: 31.8 g/dL (ref 30.0–36.0)
MCV: 91.1 fL (ref 80.0–100.0)
Platelets: 213 K/uL (ref 150–400)
RBC: 4.62 MIL/uL (ref 4.22–5.81)
RDW: 13.8 % (ref 11.5–15.5)
WBC: 6.5 K/uL (ref 4.0–10.5)
nRBC: 0 % (ref 0.0–0.2)

## 2024-08-31 LAB — COMPREHENSIVE METABOLIC PANEL WITH GFR
ALT: 12 U/L (ref 0–44)
AST: 19 U/L (ref 15–41)
Albumin: 3.9 g/dL (ref 3.5–5.0)
Alkaline Phosphatase: 76 U/L (ref 38–126)
Anion gap: 8 (ref 5–15)
BUN: 28 mg/dL — ABNORMAL HIGH (ref 8–23)
CO2: 27 mmol/L (ref 22–32)
Calcium: 9.2 mg/dL (ref 8.9–10.3)
Chloride: 106 mmol/L (ref 98–111)
Creatinine, Ser: 1.12 mg/dL (ref 0.61–1.24)
GFR, Estimated: >60 mL/min (ref >60)
Glucose, Bld: 88 mg/dL (ref 70–99)
Potassium: 4 mmol/L (ref 3.5–5.1)
Sodium: 141 mmol/L (ref 135–145)
Total Bilirubin: 0.8 mg/dL (ref 0.0–1.2)
Total Protein: 5.9 g/dL — ABNORMAL LOW (ref 6.5–8.1)

## 2024-09-19 ENCOUNTER — Non-Acute Institutional Stay (SKILLED_NURSING_FACILITY): Payer: Self-pay | Admitting: Adult Health

## 2024-09-19 ENCOUNTER — Encounter: Payer: Self-pay | Admitting: Adult Health

## 2024-09-19 NOTE — Progress Notes (Unsigned)
 Location:  [facility] Room Number: 129 Place of Service:  SNF (31)   CODE STATUS: ***  Allergies  Allergen Reactions   Hydrochlorothiazide  Other (See Comments)    hypokalemia    Chief Complaint  Patient presents with   Medical Management of Chronic Issues       ***    HPI:    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   Procedure: RIGHT REVERSE SHOULDER ARTHROPLASTY;  Surgeon: Eva Herring, MD;  Location: [facility] OR;  Service: Orthopedics;  Laterality: Right;  Right reverse total shoulder    Social History   Socioeconomic History   Marital status: Married    Spouse name: Sopheap Boehle   Number of children: 2   Years of education: college   Highest education level: Some college, no degree  Occupational History   Occupation: retired    Associate Professor: RETIRED  Tobacco Use   Smoking status: Former    Current packs/day: 0.00    Average packs/day: 1.5 packs/day for 40.0 years (60.0 ttl pk-yrs)    Types: Cigarettes    Start date: 10/12/1950    Quit date: 10/12/1990    Years since quitting: 33.9    Passive exposure: Past   Smokeless tobacco: Never  Vaping Use   Vaping status: Never Used  Substance and Sexual Activity   Alcohol use: No   Drug use: No   Sexual activity: Not Currently    Partners: Female  Other Topics Concern   Not on file  Social History Narrative   Not  on file   Social Drivers of Health   Financial Resource Strain: Low Risk  (09/27/2023)   Overall Financial Resource Strain (CARDIA)    Difficulty of Paying Living Expenses: Not hard at all  Food Insecurity: No Food Insecurity (09/28/2023)   Hunger Vital Sign    Worried About Running Out of Food in the Last Year: Never true    Ran Out of Food in the Last Year: Never true  Transportation Needs: No Transportation Needs (09/28/2023)   PRAPARE - Administrator, Civil Service (Medical): No    Lack of Transportation (Non-Medical): No  Physical Activity: Sufficiently Active (09/27/2023)   Exercise Vital Sign    Days of Exercise per Week: 7 days    Minutes of Exercise per Session: 30 min  Recent Concern: Physical Activity - Inactive (09/27/2023)   Exercise Vital Sign    Days of Exercise per Week: 0 days    Minutes of Exercise per Session: 0 min  Stress: No Stress Concern Present (09/27/2023)   [employer] of Occupational Health - Occupational Stress Questionnaire    Feeling of Stress : Not at all  Social Connections: Moderately Integrated (09/27/2023)   Social Connection and Isolation Panel    Frequency of Communication with Friends and Family:  More than three times a week    Frequency of Social Gatherings with Friends and Family: More than three times a week    Attends Religious Services: 1 to 4 times per year    Active Member of [employer] or Organizations: No    Attends Banker Meetings: Never    Marital Status: Married  Catering Manager Violence: Not At Risk (09/28/2023)   Humiliation, Afraid, Rape, and Kick questionnaire    Fear of Current or Ex-Partner: No    Emotionally Abused: No    Physically Abused: No    Sexually Abused: No   Family History  Problem Relation Age of Onset   Heart disease Unknown    Asthma Unknown       VITAL SIGNS BP 138/80   Pulse 69   Temp (!) 97.5 F (36.4 C)   Resp 18   Ht 5' 8 (1.727 m)   Wt 145 lb 6.4 oz (66 kg)    SpO2 96%   BMI 22.11 kg/m   Outpatient Encounter Medications as of 09/19/2024  Medication Sig   acetaminophen  (TYLENOL ) 325 MG tablet Take 2 tablets (650 mg total) by mouth every 6 (six) hours as needed for mild pain (pain score 1-3) (or Fever >/= 100.4).   albuterol (VENTOLIN HFA) 108 (90 Base) MCG/ACT inhaler Inhale into the lungs every 6 (six) hours as needed for wheezing or shortness of breath.   ammonium lactate (LAC-HYDRIN) 12 % lotion Apply 1 Application topically daily.   bisacodyl  (DULCOLAX) 5 MG EC tablet Take 1 tablet (5 mg total) by mouth daily as needed for moderate constipation.   calcium  carbonate (TUMS - DOSED IN MG ELEMENTAL CALCIUM ) 500 MG chewable tablet Chew 1 tablet by mouth 3 (three) times daily.   cyanocobalamin  1000 MCG tablet Take 2,000 mcg by mouth daily.   Eyelid Cleansers (OCUSOFT LID SCRUB EX) Apply topically as needed.   feeding supplement (ENSURE ENLIVE / ENSURE PLUS) LIQD Take 0.08 mLs by mouth daily.   loratadine  (CLARITIN ) 10 MG tablet Take 10 mg by mouth at bedtime.   memantine  (NAMENDA ) 10 MG tablet Take 1 tablet (10 mg total) by mouth 2 (two) times daily.   omeprazole (PRILOSEC) 20 MG capsule Take 20 mg by mouth daily.   polyethylene glycol (MIRALAX  / GLYCOLAX ) 17 g packet Take 17 g by mouth daily as needed for mild constipation.   potassium chloride  SA (KLOR-CON  M) 20 MEQ tablet Take 20 mEq by mouth 2 (two) times daily.   Protein (PROSOURCE PO) Take 30 mLs by mouth 3 (three) times daily.   tamsulosin (FLOMAX) 0.4 MG CAPS capsule Take 0.4 mg by mouth daily.   UNABLE TO FIND Take 1 Container by mouth 3 (three) times daily with meals. Med Name: magic cup  Also protein cookie in between meals   No facility-administered encounter medications on file as of 09/19/2024.     SIGNIFICANT DIAGNOSTIC EXAMS       ASSESSMENT/ PLAN:     Barnie Seip NP [facility]   call [phone]

## 2024-09-22 ENCOUNTER — Non-Acute Institutional Stay (SKILLED_NURSING_FACILITY): Admitting: Adult Health

## 2024-09-22 ENCOUNTER — Encounter: Payer: Self-pay | Admitting: Adult Health

## 2024-09-22 DIAGNOSIS — F0390 Unspecified dementia without behavioral disturbance: Secondary | ICD-10-CM | POA: Diagnosis not present

## 2024-09-22 DIAGNOSIS — I700 Atherosclerosis of aorta: Secondary | ICD-10-CM | POA: Diagnosis not present

## 2024-09-22 DIAGNOSIS — N1832 Chronic kidney disease, stage 3b: Secondary | ICD-10-CM

## 2024-09-22 NOTE — Progress Notes (Signed)
 Location:  [facility] Room Number: 137 Place of Service:  SNF (31)   CODE STATUS: dnr   Allergies[1]  Chief Complaint  Patient presents with   Acute Visit    Care plan meeting     HPI:  We have come together for his care plan meeting.  BIMS 4/15 mood 0/30. Uses walker and wheelchair with no falls. He requires moderate assist with adl care. He is occasionally incontinent of bladder and frequently incontinent of bowel. Dietary: setup for meals, appetite 76-100% meals; regular diet weight is 146 pounds. Therapy: none at this time. Activities: personal. He will continue to be followed for his chronic illnesses including:  Aortic atherosclerosis    Major neurocognitive disorder     Chronic kidney disease stage 3b  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   Procedure: RIGHT REVERSE SHOULDER ARTHROPLASTY;  Surgeon: Eva Herring, MD;  Location: [facility] OR;  Service: Orthopedics;  Laterality: Right;  Right reverse total shoulder    Social History   Socioeconomic History   Marital status: Married    Spouse name: Damyon Mullane   Number of children: 2   Years of education: college   Highest education level: Some college, no degree  Occupational History   Occupation: retired    Associate Professor: RETIRED  Tobacco Use   Smoking status: Former    Current packs/day: 0.00    Average packs/day: 1.5  packs/day for 40.0 years (60.0 ttl pk-yrs)    Types: Cigarettes    Start date: 10/12/1950    Quit date: 10/12/1990    Years since quitting: 33.9    Passive exposure: Past   Smokeless tobacco: Never  Vaping Use   Vaping status: Never Used  Substance and Sexual Activity   Alcohol use: No   Drug use: No   Sexual activity: Not Currently    Partners: Female  Other Topics Concern   Not on file  Social History Narrative   Not on file   Social Drivers of Health   Tobacco Use: Medium Risk (09/22/2024)   Patient History    Smoking Tobacco Use: Former    Smokeless Tobacco Use: Never    Passive Exposure: Past  Physicist, Medical Strain: Low Risk (09/27/2023)   Overall Financial Resource Strain (CARDIA)    Difficulty of Paying Living Expenses: Not hard at all  Food Insecurity: No Food Insecurity (09/28/2023)   Hunger Vital Sign    Worried About Running Out of Food in the Last Year: Never true    Ran Out of Food in the Last Year: Never true  Transportation Needs: No Transportation Needs (09/28/2023)   PRAPARE - Administrator, Civil Service (Medical): No    Lack of Transportation (Non-Medical): No  Physical Activity: Sufficiently Active (09/27/2023)   Exercise Vital Sign    Days of  Exercise per Week: 7 days    Minutes of Exercise per Session: 30 min  Recent Concern: Physical Activity - Inactive (09/27/2023)   Exercise Vital Sign    Days of Exercise per Week: 0 days    Minutes of Exercise per Session: 0 min  Stress: No Stress Concern Present (09/27/2023)   [employer] of Occupational Health - Occupational Stress Questionnaire    Feeling of Stress : Not at all  Social Connections: Moderately Integrated (09/27/2023)   Social Connection and Isolation Panel    Frequency of Communication with Friends and Family: More than three times a week    Frequency of Social Gatherings with Friends and Family: More than three times a week    Attends Religious Services: 1 to 4 times  per year    Active Member of [employer] or Organizations: No    Attends Banker Meetings: Never    Marital Status: Married  Catering Manager Violence: Not At Risk (09/28/2023)   Humiliation, Afraid, Rape, and Kick questionnaire    Fear of Current or Ex-Partner: No    Emotionally Abused: No    Physically Abused: No    Sexually Abused: No  Depression (PHQ2-9): Low Risk (09/27/2023)   Depression (PHQ2-9)    PHQ-2 Score: 0  Alcohol Screen: Low Risk (09/27/2023)   Alcohol Screen    Last Alcohol Screening Score (AUDIT): 0  Housing: Low Risk (09/28/2023)   Housing Stability Vital Sign    Unable to Pay for Housing in the Last Year: No    Number of Times Moved in the Last Year: 0    Homeless in the Last Year: No  Utilities: Not At Risk (09/28/2023)   [facility] Utilities    Threatened with loss of utilities: No  Health Literacy: Inadequate Health Literacy (09/27/2023)   B1300 Health Literacy    Frequency of need for help with medical instructions: Always   Family History  Problem Relation Age of Onset   Heart disease Unknown    Asthma Unknown       VITAL SIGNS BP (!) 143/80   Pulse 69   Temp (!) 97.5 F (36.4 C)   Resp 18   Ht 5' 8 (1.727 m)   Wt 145 lb 6.4 oz (66 kg)   SpO2 96%   BMI 22.11 kg/m   Outpatient Encounter Medications as of 09/22/2024  Medication Sig   acetaminophen  (TYLENOL ) 325 MG tablet Take 2 tablets (650 mg total) by mouth every 6 (six) hours as needed for mild pain (pain score 1-3) (or Fever >/= 100.4).   albuterol (VENTOLIN HFA) 108 (90 Base) MCG/ACT inhaler Inhale into the lungs every 6 (six) hours as needed for wheezing or shortness of breath.   ammonium lactate (LAC-HYDRIN) 12 % lotion Apply 1 Application topically daily.   bisacodyl  (DULCOLAX) 5 MG EC tablet Take 1 tablet (5 mg total) by mouth daily as needed for moderate constipation.   calcium  carbonate (TUMS - DOSED IN MG ELEMENTAL CALCIUM ) 500 MG chewable tablet Chew 1 tablet by mouth 3  (three) times daily.   cyanocobalamin  1000 MCG tablet Take 2,000 mcg by mouth daily.   Eyelid Cleansers (OCUSOFT LID SCRUB EX) Apply topically as needed.   feeding supplement (ENSURE ENLIVE / ENSURE PLUS) LIQD Take 0.08 mLs by mouth daily.   loratadine  (CLARITIN ) 10 MG tablet Take 10 mg by mouth at bedtime.   memantine  (NAMENDA ) 10 MG tablet Take 1 tablet (10 mg total) by mouth 2 (two) times daily.  omeprazole (PRILOSEC) 20 MG capsule Take 20 mg by mouth daily.   polyethylene glycol (MIRALAX  / GLYCOLAX ) 17 g packet Take 17 g by mouth daily as needed for mild constipation.   potassium chloride  SA (KLOR-CON  M) 20 MEQ tablet Take 20 mEq by mouth 2 (two) times daily.   Protein (PROSOURCE PO) Take 30 mLs by mouth 3 (three) times daily.   tamsulosin (FLOMAX) 0.4 MG CAPS capsule Take 0.4 mg by mouth daily.   UNABLE TO FIND Take 1 Container by mouth 3 (three) times daily with meals. Med Name: magic cup  Also protein cookie in between meals   No facility-administered encounter medications on file as of 09/22/2024.     SIGNIFICANT DIAGNOSTIC EXAMS  PREVIOUS   09-27-23: wbc 9.5; hgb 12.8; hct 39.9; mcv 88.9 plt 193; glucose 105; bun 39; creat 1.39; k+ 3.5; na++ 145; ca 9.4 gfr 50; protein 6.2 albumin 3.0; CK 2986 09-29-23: wbc 4.1; hgb 10.4; hct 32.1; mcv 90.4 plt 167 glucose 84; bun 43; creat 1.23; k+ 3.0; na++ 141; ca 7.7; gfr 58; CK 988 10-04-23: wbc 6.4; hgb 11.8; hct 35.7; mcv 87.9 plt 286; glucose 91; bun 23; creat 0.97; k+ 3.3; na++ 142; ca 8.1; gfr >60  10-07-23: glucose 73; bun 23; creat 0.92; k+ 3.5; na++ 136; ca 8.1; gfr >60 CK 27 01-14-24 wbc 5.9; hgb 14.0; hct 43.7; mcv 88.6 plt 186; vitamin B 12: 595 tsh 3.207  TODAY  08-31-24: wbc 6.5 hgb 13.4; hct 42.1; mcv 91.1 plt 213; glucose 88; bun 28;creat 1.12; k+ 4.0; na++ 141; ca 9.2; gfr >60; protein 5.9 albumin 3.9;    Review of Systems  Constitutional:  Negative for malaise/fatigue.  Respiratory:  Negative for cough and shortness of  breath.   Cardiovascular:  Negative for chest pain, palpitations and leg swelling.  Gastrointestinal:  Negative for abdominal pain, constipation and heartburn.  Musculoskeletal:  Negative for back pain, joint pain and myalgias.  Skin: Negative.   Neurological:  Negative for dizziness.  Psychiatric/Behavioral:  The patient is not nervous/anxious.     Physical Exam Constitutional:      General: He is not in acute distress.    Appearance: He is well-developed. He is not diaphoretic.  Neck:     Thyroid : No thyromegaly.  Cardiovascular:     Rate and Rhythm: Normal rate and regular rhythm.     Pulses: Normal pulses.     Heart sounds: Normal heart sounds.  Pulmonary:     Effort: Pulmonary effort is normal. No respiratory distress.     Breath sounds: Normal breath sounds.  Abdominal:     General: Bowel sounds are normal. There is no distension.     Palpations: Abdomen is soft.     Tenderness: There is no abdominal tenderness.  Musculoskeletal:        General: Normal range of motion.     Cervical back: Neck supple.     Right lower leg: No edema.     Left lower leg: No edema.  Lymphadenopathy:     Cervical: No cervical adenopathy.  Skin:    General: Skin is warm and dry.  Neurological:     Mental Status: He is alert. Mental status is at baseline.     Comments:  BCAT 17/50  Psychiatric:        Mood and Affect: Mood normal.     ASSESSMENT/ PLAN:  TODAY  Aortic atherosclerosis Major neurocognitive disorder Chronic kidney disease stage 3b  Will continue current medications Will  continue current plan of care Will continue to monitor his status  Time spent with patient: 40 minutes: care plan; dietary; medications; activities.    Barnie Seip NP [facility]  call [phone]      [1]  Allergies Allergen Reactions   Hydrochlorothiazide  Other (See Comments)    hypokalemia

## 2024-10-23 ENCOUNTER — Non-Acute Institutional Stay (SKILLED_NURSING_FACILITY): Payer: Self-pay | Admitting: Internal Medicine

## 2024-10-23 ENCOUNTER — Encounter: Payer: Self-pay | Admitting: Internal Medicine

## 2024-10-23 DIAGNOSIS — I10 Essential (primary) hypertension: Secondary | ICD-10-CM

## 2024-10-23 DIAGNOSIS — F0390 Unspecified dementia without behavioral disturbance: Secondary | ICD-10-CM | POA: Diagnosis not present

## 2024-10-23 DIAGNOSIS — E440 Moderate protein-calorie malnutrition: Secondary | ICD-10-CM | POA: Diagnosis not present

## 2024-10-23 DIAGNOSIS — D513 Other dietary vitamin B12 deficiency anemia: Secondary | ICD-10-CM

## 2024-10-23 DIAGNOSIS — R0609 Other forms of dyspnea: Secondary | ICD-10-CM

## 2024-10-23 NOTE — Assessment & Plan Note (Addendum)
 Current CBC and MCV are normal.  He remains on B12 supplementation.

## 2024-10-23 NOTE — Assessment & Plan Note (Signed)
 He is pleasantly demented with no reported behavioral issues.

## 2024-10-23 NOTE — Assessment & Plan Note (Addendum)
 Total protein is reduced is 5.9 but albumin is normal at 3.9.  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  TSH was therapeutic in April and will be updated in the next 90 days.

## 2024-10-23 NOTE — Progress Notes (Unsigned)
 "   NURSING HOME LOCATION:  [facility] ROOM NUMBER:  [id number]  CODE STATUS:  DNR  PCP: Landy Barnie RAMAN, NP   This is a nursing facility follow up visit of chronic medical diagnoses to document compliance with Regulation 483.30 (c) in [employer] Survey Manual Phase 2 which mandates caregiver visit ( visits can alternate among physician, PA or NP as per statutes) within 10 days of 30 days / 60 days/ 90 days post admission to SNF date  .  Interim medical record and care since last SNF visit was updated with review of diagnostic studies and change in clinical status since last visit were documented.  HPI: He is permanent resident of this facility with medical diagnoses of CAD; dyslipidemia; essential hypertension; protein/caloric malnutrition; GERD; adult failure to thrive; history of B12 deficiency; and dementia.  Most recent labs were completed 08/31/2024; mild prerenal azotemia was present with a BUN of 28.  Total protein was 5.9 and albumin 3.9.  CBC was normal.  No differential was completed.  Review of systems: Dementia invalidated responses.  Entire review of systems was negative with the response of the not that I am aware of.  He went on to say as far as I know I am doing all right.  Then he stated when my wife and then he gave me her full name gets here that is the level of my life.  Review of system Staff reports that since October he has gained from 135 pounds up to 149.  The most recent TSH was therapeutic @ 3.207 in April 2025.  Constitutional: No fever, significant weight change, fatigue  Eyes: No redness, discharge, pain, vision change ENT/mouth: No nasal congestion,  purulent discharge, earache, change in hearing, sore throat  Cardiovascular: No chest pain, palpitations, paroxysmal nocturnal dyspnea, claudication, edema  Respiratory: No cough, sputum production, hemoptysis, DOE, significant snoring, apnea   Gastrointestinal: No heartburn, dysphagia,  abdominal pain, nausea /vomiting, rectal bleeding, melena, change in bowels Genitourinary: No dysuria, hematuria, pyuria, incontinence, nocturia Musculoskeletal: No joint stiffness, joint swelling, weakness, pain Dermatologic: No rash, pruritus, change in appearance of skin Neurologic: No dizziness, headache, syncope, seizures, numbness, tingling Psychiatric: No significant anxiety, depression, insomnia, anorexia Endocrine: No change in hair/skin/nails, excessive thirst, excessive hunger, excessive urination  Hematologic/lymphatic: No significant bruising, lymphadenopathy, abnormal bleeding Allergy/immunology: No itchy/watery eyes, significant sneezing, urticaria, angioedema  Physical exam:  Pertinent or positive findings: He is pleasantly.  He exhibits no acute distress.  There is a hypopigmented keratotic lesion over the anterior crown.  Pattern alopecia is present; he performs a comb over.  There is slight ptosis on the left.  Also of the left iris is slightly darker in the left pupil slightly larger than on the right.  There is some malalignment of the mandibular teeth.  Heart sounds are distant.  Breath sounds are dense.  Decreased.  Limb atrophy and interosseous wasting is present despite the reported weight gain.  General appearance: Adequately nourished; no acute distress, increased work of breathing is present.   Lymphatic: No lymphadenopathy about the head, neck, axilla. Eyes: No conjunctival inflammation or lid edema is present. There is no scleral icterus. Ears:  External ear exam shows no significant lesions or deformities.   Nose:  External nasal examination shows no deformity or inflammation. Nasal mucosa are pink and moist without lesions, exudates Oral exam:  Lips and gums are healthy appearing. There is no oropharyngeal erythema or exudate. Neck:  No thyromegaly, masses,  tenderness noted.    Heart:  Normal rate and regular rhythm. S1 and S2 normal without gallop, murmur, click,  rub .  Lungs: Chest clear to auscultation without wheezes, rhonchi, rales, rubs. Abdomen: Bowel sounds are normal. Abdomen is soft and nontender with no organomegaly, hernias, masses. GU: Deferred  Extremities:  No cyanosis, clubbing, edema  Neurologic exam : Cn 2-7 intact Strength equal  in upper & lower extremities Balance, Rhomberg, finger to nose testing could not be completed due to clinical state Deep tendon reflexes are equal Skin: Warm & dry w/o tenting. No significant lesions or rash.  See summary under each active problem in the Problem List with associated updated therapeutic plan :  "

## 2024-10-23 NOTE — Assessment & Plan Note (Addendum)
 BP controlled without designated antihypertensive therapy except for tamsulosin which can lower blood pressure.

## 2024-10-23 NOTE — Patient Instructions (Signed)
 See assessment and plan under each diagnosis in the problem list and acutely for this visit
